# Patient Record
Sex: Male | Born: 1935 | Race: White | Hispanic: No | Smoking: Never smoker
Health system: Southern US, Community
[De-identification: ages and names within clinical notes are randomized; demographics above are authoritative.]

## PROBLEM LIST (undated history)

## (undated) DIAGNOSIS — K8020 Calculus of gallbladder without cholecystitis without obstruction: Secondary | ICD-10-CM

## (undated) DIAGNOSIS — D122 Benign neoplasm of ascending colon: Secondary | ICD-10-CM

## (undated) DIAGNOSIS — M51369 Other intervertebral disc degeneration, lumbar region without mention of lumbar back pain or lower extremity pain: Secondary | ICD-10-CM

## (undated) DIAGNOSIS — K429 Umbilical hernia without obstruction or gangrene: Secondary | ICD-10-CM

## (undated) DIAGNOSIS — M5136 Other intervertebral disc degeneration, lumbar region: Secondary | ICD-10-CM

## (undated) DIAGNOSIS — M1990 Unspecified osteoarthritis, unspecified site: Secondary | ICD-10-CM

## (undated) DIAGNOSIS — N529 Male erectile dysfunction, unspecified: Secondary | ICD-10-CM

## (undated) DIAGNOSIS — I10 Essential (primary) hypertension: Secondary | ICD-10-CM

## (undated) DIAGNOSIS — H8309 Labyrinthitis, unspecified ear: Secondary | ICD-10-CM

## (undated) DIAGNOSIS — Z974 Presence of external hearing-aid: Secondary | ICD-10-CM

## (undated) DIAGNOSIS — E785 Hyperlipidemia, unspecified: Secondary | ICD-10-CM

## (undated) DIAGNOSIS — K635 Polyp of colon: Secondary | ICD-10-CM

## (undated) DIAGNOSIS — R634 Abnormal weight loss: Secondary | ICD-10-CM

## (undated) DIAGNOSIS — D649 Anemia, unspecified: Secondary | ICD-10-CM

## (undated) DIAGNOSIS — L603 Nail dystrophy: Secondary | ICD-10-CM

## (undated) DIAGNOSIS — K219 Gastro-esophageal reflux disease without esophagitis: Secondary | ICD-10-CM

## (undated) DIAGNOSIS — H902 Conductive hearing loss, unspecified: Secondary | ICD-10-CM

## (undated) DIAGNOSIS — L309 Dermatitis, unspecified: Secondary | ICD-10-CM

## (undated) DIAGNOSIS — K921 Melena: Secondary | ICD-10-CM

## (undated) DIAGNOSIS — T7840XA Allergy, unspecified, initial encounter: Secondary | ICD-10-CM

## (undated) DIAGNOSIS — L280 Lichen simplex chronicus: Secondary | ICD-10-CM

## (undated) HISTORY — DX: Allergy, unspecified, initial encounter: T78.40XA

## (undated) HISTORY — PX: FRACTURE SURGERY: SHX138

## (undated) HISTORY — DX: Essential (primary) hypertension: I10

## (undated) HISTORY — DX: Polyp of colon: K63.5

## (undated) HISTORY — DX: Gastro-esophageal reflux disease without esophagitis: K21.9

## (undated) HISTORY — PX: OTHER SURGICAL HISTORY: SHX169

## (undated) HISTORY — PX: HERNIA REPAIR: SHX51

## (undated) HISTORY — PX: TONSILLECTOMY: SUR1361

## (undated) HISTORY — DX: Anemia, unspecified: D64.9

## (undated) HISTORY — DX: Lichen simplex chronicus: L28.0

---

## 1998-02-19 ENCOUNTER — Emergency Department (HOSPITAL_COMMUNITY): Admission: EM | Admit: 1998-02-19 | Discharge: 1998-02-19 | Payer: Self-pay | Admitting: Internal Medicine

## 1998-02-19 ENCOUNTER — Ambulatory Visit (HOSPITAL_COMMUNITY): Admission: RE | Admit: 1998-02-19 | Discharge: 1998-02-20 | Payer: Self-pay | Admitting: *Deleted

## 1998-07-29 ENCOUNTER — Ambulatory Visit (HOSPITAL_BASED_OUTPATIENT_CLINIC_OR_DEPARTMENT_OTHER): Admission: RE | Admit: 1998-07-29 | Discharge: 1998-07-29 | Payer: Self-pay | Admitting: Orthopedic Surgery

## 2001-02-01 ENCOUNTER — Encounter: Payer: Self-pay | Admitting: Emergency Medicine

## 2001-02-01 ENCOUNTER — Emergency Department (HOSPITAL_COMMUNITY): Admission: EM | Admit: 2001-02-01 | Discharge: 2001-02-01 | Payer: Self-pay | Admitting: Emergency Medicine

## 2004-04-02 ENCOUNTER — Emergency Department (HOSPITAL_COMMUNITY): Admission: EM | Admit: 2004-04-02 | Discharge: 2004-04-02 | Payer: Self-pay | Admitting: Emergency Medicine

## 2004-04-03 ENCOUNTER — Emergency Department (HOSPITAL_COMMUNITY): Admission: EM | Admit: 2004-04-03 | Discharge: 2004-04-03 | Payer: Self-pay | Admitting: Emergency Medicine

## 2005-07-15 ENCOUNTER — Emergency Department (HOSPITAL_COMMUNITY): Admission: EM | Admit: 2005-07-15 | Discharge: 2005-07-15 | Payer: Self-pay | Admitting: Emergency Medicine

## 2006-05-30 ENCOUNTER — Ambulatory Visit: Payer: Self-pay | Admitting: Family Medicine

## 2007-01-30 ENCOUNTER — Ambulatory Visit: Payer: Self-pay | Admitting: Family Medicine

## 2007-06-13 ENCOUNTER — Ambulatory Visit: Payer: Self-pay | Admitting: Family Medicine

## 2007-09-24 ENCOUNTER — Ambulatory Visit: Payer: Self-pay | Admitting: Family Medicine

## 2007-09-24 DIAGNOSIS — M129 Arthropathy, unspecified: Secondary | ICD-10-CM | POA: Insufficient documentation

## 2007-09-24 DIAGNOSIS — T50995A Adverse effect of other drugs, medicaments and biological substances, initial encounter: Secondary | ICD-10-CM | POA: Insufficient documentation

## 2007-09-24 DIAGNOSIS — E785 Hyperlipidemia, unspecified: Secondary | ICD-10-CM | POA: Insufficient documentation

## 2007-09-24 DIAGNOSIS — E039 Hypothyroidism, unspecified: Secondary | ICD-10-CM | POA: Insufficient documentation

## 2007-09-24 DIAGNOSIS — D649 Anemia, unspecified: Secondary | ICD-10-CM | POA: Insufficient documentation

## 2007-09-24 DIAGNOSIS — B354 Tinea corporis: Secondary | ICD-10-CM | POA: Insufficient documentation

## 2007-09-24 DIAGNOSIS — H902 Conductive hearing loss, unspecified: Secondary | ICD-10-CM | POA: Insufficient documentation

## 2007-10-09 ENCOUNTER — Telehealth: Payer: Self-pay | Admitting: Family Medicine

## 2007-10-10 ENCOUNTER — Encounter: Admission: RE | Admit: 2007-10-10 | Discharge: 2007-10-10 | Payer: Self-pay | Admitting: Family Medicine

## 2007-10-13 ENCOUNTER — Encounter: Admission: RE | Admit: 2007-10-13 | Discharge: 2007-10-13 | Payer: Self-pay | Admitting: Family Medicine

## 2007-11-05 ENCOUNTER — Ambulatory Visit: Payer: Self-pay | Admitting: Family Medicine

## 2007-11-05 DIAGNOSIS — H8309 Labyrinthitis, unspecified ear: Secondary | ICD-10-CM | POA: Insufficient documentation

## 2008-02-18 ENCOUNTER — Ambulatory Visit: Payer: Self-pay | Admitting: Family Medicine

## 2008-07-28 ENCOUNTER — Ambulatory Visit: Payer: Self-pay | Admitting: Family Medicine

## 2008-07-28 DIAGNOSIS — L259 Unspecified contact dermatitis, unspecified cause: Secondary | ICD-10-CM | POA: Insufficient documentation

## 2008-07-28 DIAGNOSIS — F528 Other sexual dysfunction not due to a substance or known physiological condition: Secondary | ICD-10-CM | POA: Insufficient documentation

## 2008-08-03 ENCOUNTER — Telehealth (INDEPENDENT_AMBULATORY_CARE_PROVIDER_SITE_OTHER): Payer: Self-pay | Admitting: *Deleted

## 2008-08-19 ENCOUNTER — Telehealth: Payer: Self-pay | Admitting: Family Medicine

## 2008-08-25 ENCOUNTER — Encounter: Payer: Self-pay | Admitting: Family Medicine

## 2008-08-26 ENCOUNTER — Encounter: Payer: Self-pay | Admitting: Family Medicine

## 2008-11-03 ENCOUNTER — Ambulatory Visit: Payer: Self-pay | Admitting: Internal Medicine

## 2008-11-24 ENCOUNTER — Ambulatory Visit: Payer: Self-pay | Admitting: Internal Medicine

## 2008-11-24 LAB — HM COLONOSCOPY

## 2008-12-18 ENCOUNTER — Ambulatory Visit (HOSPITAL_COMMUNITY): Admission: RE | Admit: 2008-12-18 | Discharge: 2008-12-18 | Payer: Self-pay | Admitting: Orthopedic Surgery

## 2009-01-25 ENCOUNTER — Encounter: Payer: Self-pay | Admitting: Family Medicine

## 2009-02-15 ENCOUNTER — Encounter: Payer: Self-pay | Admitting: Family Medicine

## 2009-03-22 ENCOUNTER — Encounter: Payer: Self-pay | Admitting: Family Medicine

## 2009-05-09 HISTORY — PX: OTHER SURGICAL HISTORY: SHX169

## 2009-07-08 ENCOUNTER — Ambulatory Visit: Payer: Self-pay | Admitting: Family Medicine

## 2009-11-04 ENCOUNTER — Ambulatory Visit: Payer: Self-pay | Admitting: Family Medicine

## 2009-11-04 DIAGNOSIS — K219 Gastro-esophageal reflux disease without esophagitis: Secondary | ICD-10-CM | POA: Insufficient documentation

## 2009-11-30 ENCOUNTER — Ambulatory Visit: Payer: Self-pay | Admitting: Family Medicine

## 2010-06-21 ENCOUNTER — Telehealth: Payer: Self-pay | Admitting: Family Medicine

## 2010-08-23 ENCOUNTER — Encounter: Payer: Self-pay | Admitting: Family Medicine

## 2010-08-24 ENCOUNTER — Telehealth: Payer: Self-pay | Admitting: Family Medicine

## 2010-09-04 LAB — CONVERTED CEMR LAB
ALT: 29 units/L (ref 0–53)
AST: 25 units/L (ref 0–37)
Alkaline Phosphatase: 74 units/L (ref 39–117)
Basophils Relative: 0.3 % (ref 0.0–1.0)
Bilirubin, Direct: 0.1 mg/dL (ref 0.0–0.3)
CO2: 31 meq/L (ref 19–32)
Calcium: 9.6 mg/dL (ref 8.4–10.5)
Chloride: 108 meq/L (ref 96–112)
Creatinine, Ser: 1 mg/dL (ref 0.4–1.5)
Eosinophils Relative: 5.3 % — ABNORMAL HIGH (ref 0.0–5.0)
Glucose, Bld: 110 mg/dL — ABNORMAL HIGH (ref 70–99)
HDL: 37.3 mg/dL — ABNORMAL LOW (ref >39.0)
Ketones, urine, test strip: NEGATIVE
Lymphocytes Relative: 23.5 % (ref 12.0–46.0)
Nitrite: NEGATIVE
Platelets: 193 10*3/uL (ref 150–400)
RBC: 4.97 M/uL (ref 4.22–5.81)
RDW: 12.9 % (ref 11.5–14.6)
Total CHOL/HDL Ratio: 5.4
Total Protein: 6.5 g/dL (ref 6.0–8.3)
Triglycerides: 130 mg/dL (ref 0–149)
Urobilinogen, UA: 0.2
VLDL: 26 mg/dL (ref 0–40)
WBC: 4.3 10*3/uL — ABNORMAL LOW (ref 4.5–10.5)
pH: 6.5

## 2010-09-06 NOTE — Progress Notes (Signed)
Summary: Pt is req a written script for Omeprazole 20mg   Phone Note Call from Patient   Caller: Patient Summary of Call: Pt is req to get a written script for Omeprazole 20mg  1qd. Pt says that he has found an online pharmacy where he can order meds cheaper. Pls call pt when written script is available.  Initial call taken by: Lucy Antigua,  June 21, 2010 3:07 PM  Follow-up for Phone Call        OK he can have a written rx for Omeprazole 20mg  tab 1 tab by mouth once daily, # 30, no refills. Follow-up by: Danise Edge MD,  June 21, 2010 7:40 PM  Additional Follow-up for Phone Call Additional follow up Details #1::        Put RX in black file to put up front. Pt informed. Additional Follow-up by: Josph Macho RMA,  June 22, 2010 8:23 AM    New/Updated Medications: OMEPRAZOLE 20 MG CPDR (OMEPRAZOLE) 1 tab by mouth daily Prescriptions: OMEPRAZOLE 20 MG CPDR (OMEPRAZOLE) 1 tab by mouth daily  #30 x 0   Entered by:   Josph Macho RMA   Authorized by:   Danise Edge MD   Signed by:   Josph Macho RMA on 06/22/2010   Method used:   Print then Give to Patient   RxID:   [id number] OMEPRAZOLE 20 MG CPDR (OMEPRAZOLE) OTC  #30 x 0   Entered by:   Josph Macho RMA   Authorized by:   Danise Edge MD   Signed by:   Josph Macho RMA on 06/22/2010   Method used:   Print then Give to Patient   RxID:   [id number]  Second one shredded was for OTC/ CF

## 2010-09-06 NOTE — Assessment & Plan Note (Signed)
Summary: MEDICATION CONCERNS // RS   Vital Signs:  Patient profile:   77 year old male Weight:      173 pounds O2 Sat:      96 % Temp:     98.1 degrees F Pulse rate:   71 / minute BP sitting:   130 / 80  (left arm)  Vitals Entered By: Pura Spice, RN (November 30, 2009 4:01 PM) CC: nexium caused diarrhea taking OTC omperazole  and doing ok with it.    History of Present Illness: Cyst in the 31-year-old white male was in recently with severe GERD and was treated with Nexium and after approximately 2 weeks he began having rather severe diarrhea after taking Nexium. He stopped it but then had reoccurrence of his esophageal reflux and then started taking omeprazole 20 mg OTC and doing fine, no diarrhea and relief of her He continues to see the dermatologist regarding his thumb clots or trauma of his lower extremity which is improving only slightly him to return for followup treatment No other complaints  Allergies (verified): No Known Drug Allergies  Past History:  Past Medical History: Last updated: 09/24/2007 Unremarkable  Past Surgical History: Last updated: 07/08/2009 Hernioraphy, rt , Oct [address]  Review of Systems      See HPI  The patient denies anorexia, fever, weight loss, weight gain, vision loss, decreased hearing, hoarseness, chest pain, syncope, dyspnea on exertion, peripheral edema, prolonged cough, headaches, hemoptysis, abdominal pain, melena, hematochezia, severe indigestion/heartburn, hematuria, incontinence, genital sores, muscle weakness, suspicious skin lesions, transient blindness, difficulty walking, depression, unusual weight change, abnormal bleeding, enlarged lymph nodes, angioedema, breast masses, and testicular masses.    Physical Exam  General:  Well-developed,well-nourished,in no acute distress; alert,appropriate and cooperative throughout examination Lungs:  Normal respiratory effort, chest expands symmetrically. Lungs are clear to auscultation, no  crackles or wheezes. Heart:  Normal rate and regular rhythm. S1 and S2 normal without gallop, murmur, click, rub or other extra sounds. Abdomen:  Bowel sounds positive,abdomen soft and non-tender without masses, organomegaly or hernias noted. Skin:  erythematous rash over left lower extremity   Impression & Recommendations:  Problem # 1:  GERD (ICD-530.81) Assessment Improved  The following medications were removed from the medication list:    Nexium 40 Mg Cpdr (Esomeprazole magnesium) .Marland Kitchen... 1 each day His updated medication list for this problem includes:    Omeprazole 20 Mg Cpdr (Omeprazole) ..... Otc  Problem # 2:  ERECTILE DYSFUNCTION (ICD-302.72) Assessment: Improved continues to use Viagra which he obtains from [place]  Problem # 3:  DERMATITIS (ICD-692.9) Assessment: Unchanged  Complete Medication List: 1)  Aspirin 81 Mg Tbec (Aspirin) .... One by mouth every day 2)  Omeprazole 20 Mg Cpdr (Omeprazole) .... Otc  Patient Instructions: 1)  for your GERD continue to get omeprazole 20 mg OTC and take one per day 2)  Return to see your dermatologist

## 2010-09-06 NOTE — Assessment & Plan Note (Signed)
Summary: NODULE IN GROIN AREA / HIATAL HERNIA CONCERNS // RS   Vital Signs:  Patient profile:   77 year old male Weight:      173 pounds O2 Sat:      97 % Temp:     98 degrees F Pulse rate:   86 / minute BP sitting:   140 / 80  (left arm)  Vitals Entered By: Pura Spice, RN (November 04, 2009 1:12 PM) CC: wants med for hiatal heria and ck left groin ? hernia    History of Present Illness: This 77 year old white male is in today complaining of symptoms compatible with GERD, occurring some Onalee Hua remarkably so at night. He does have definite symptoms of gastroesophageal reflux. Has had a herniorrhaphy left inguinal region and is concerned possible to read her and desires to be checked. In addition to this he has had a fungal rash over the perineum and thigh she has been treated with Lotrimin and is improving Her pelvis function is being controlled with Cialis which he buys in [place]  Allergies (verified): No Known Drug Allergies  Past History:  Past Medical History: Last updated: 09/24/2007 Unremarkable  Past Surgical History: Last updated: 07/08/2009 Hernioraphy, rt , Oct [address]  Review of Systems  The patient denies anorexia, fever, weight loss, weight gain, vision loss, decreased hearing, hoarseness, chest pain, syncope, dyspnea on exertion, peripheral edema, prolonged cough, headaches, hemoptysis, abdominal pain, melena, hematochezia, severe indigestion/heartburn, hematuria, incontinence, genital sores, muscle weakness, suspicious skin lesions, transient blindness, difficulty walking, depression, unusual weight change, abnormal bleeding, enlarged lymph nodes, angioedema, breast masses, and testicular masses.    Physical Exam  General:  Well-developed,well-nourished,in no acute distress; alert,appropriate and cooperative throughout examination Lungs:  Normal respiratory effort, chest expands symmetrically. Lungs are clear to auscultation, no crackles or wheezes. Heart:  Normal  rate and regular rhythm. S1 and S2 normal without gallop, murmur, click, rub or other extra sounds. Abdomen:  Bowel sounds positive,abdomen soft and non-tender without masses, organomegaly or hernias noted.no abdominal hernia and no inguinal hernia.   Genitalia:  past rash around her room and heard him compatible with fungal infection   Impression & Recommendations:  Problem # 1:  GERD (ICD-530.81) Assessment New  His updated medication list for this problem includes:    Nexium 40 Mg Cpdr (Esomeprazole magnesium) .Marland Kitchen... 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 One by mouth every day 2)  Nexium 40 Mg Cpdr (Esomeprazole magnesium) .Marland Kitchen.. 1 each day  Patient Instructions: 1)  For fungalinfection continue clotrimazole twice daily for another two weeks, if not better get ketoconazole and use it 2)  I  think you have hiatal hernia and esophageal reflux causing esophagitis with some esophageal stricture 3)  Take 1 Nexium 40 mg each day until finished, if symptoms rewturn then see me or call, may need to have endoscopy

## 2010-09-08 NOTE — Progress Notes (Signed)
Summary: refill  Phone Note Refill Request Call back at Home Phone [phone] Message from:  Patient------walk in  Refills Requested: Medication #1:  OMEPRAZOLE 20 MG CPDR 1 tab by mouth daily. need for mail order. please call when ready for pick up.  Initial call taken by: Warnell Forester,  August 24, 2010 2:49 PM  Follow-up for Phone Call        rx up front ready for p/u Follow-up by: Alfred Levins, CMA,  August 24, 2010 4:00 PM    Prescriptions: OMEPRAZOLE 20 MG CPDR (OMEPRAZOLE) 1 tab by mouth daily  #90 x 3   Entered by:   Alfred Levins, CMA   Authorized by:   Judithann Sheen MD   Signed by:   Alfred Levins, CMA on 08/24/2010   Method used:   Print then Give to Patient   RxID:   [id number]

## 2010-09-14 NOTE — Medication Information (Signed)
Summary: Order for Reuel Boom with wheels/[facility]  Order for Folding Walker with wheels/[facility]   Imported By: Maryln Gottron 09/06/2010 12:10:15  _____________________________________________________________________  External Attachment:    Type:   Image     Comment:   External Document

## 2010-11-13 ENCOUNTER — Ambulatory Visit: Payer: Self-pay | Admitting: Family Medicine

## 2011-01-05 ENCOUNTER — Other Ambulatory Visit: Payer: Self-pay | Admitting: Family Medicine

## 2011-01-05 ENCOUNTER — Telehealth: Payer: Self-pay | Admitting: Family Medicine

## 2011-01-05 NOTE — Telephone Encounter (Signed)
Wants to know if its time for labs to check his liver. Please advise pt.

## 2011-01-10 ENCOUNTER — Other Ambulatory Visit (INDEPENDENT_AMBULATORY_CARE_PROVIDER_SITE_OTHER): Payer: Medicare Other

## 2011-01-10 DIAGNOSIS — D649 Anemia, unspecified: Secondary | ICD-10-CM

## 2011-01-10 DIAGNOSIS — E559 Vitamin D deficiency, unspecified: Secondary | ICD-10-CM

## 2011-01-10 DIAGNOSIS — Z125 Encounter for screening for malignant neoplasm of prostate: Secondary | ICD-10-CM

## 2011-01-10 DIAGNOSIS — J309 Allergic rhinitis, unspecified: Secondary | ICD-10-CM

## 2011-01-10 DIAGNOSIS — Z79899 Other long term (current) drug therapy: Secondary | ICD-10-CM

## 2011-01-10 DIAGNOSIS — Z0000 Encounter for general adult medical examination without abnormal findings: Secondary | ICD-10-CM

## 2011-01-10 DIAGNOSIS — R079 Chest pain, unspecified: Secondary | ICD-10-CM

## 2011-01-10 DIAGNOSIS — Z0389 Encounter for observation for other suspected diseases and conditions ruled out: Secondary | ICD-10-CM

## 2011-01-10 DIAGNOSIS — K219 Gastro-esophageal reflux disease without esophagitis: Secondary | ICD-10-CM

## 2011-01-10 LAB — CBC WITH DIFFERENTIAL/PLATELET
Basophils Relative: 0.4 % (ref 0.0–3.0)
Eosinophils Absolute: 0.3 10*3/uL (ref 0.0–0.7)
HCT: 36.1 % — ABNORMAL LOW (ref 39.0–52.0)
Hemoglobin: 11.9 g/dL — ABNORMAL LOW (ref 13.0–17.0)
Lymphocytes Relative: 26.8 % (ref 12.0–46.0)
Lymphs Abs: 1.2 10*3/uL (ref 0.7–4.0)
MCHC: 32.9 g/dL (ref 30.0–36.0)
Neutro Abs: 2.6 10*3/uL (ref 1.4–7.7)
RBC: 4.61 Mil/uL (ref 4.22–5.81)

## 2011-01-10 LAB — BASIC METABOLIC PANEL
CO2: 27 mEq/L (ref 19–32)
Calcium: 9.1 mg/dL (ref 8.4–10.5)
Chloride: 106 mEq/L (ref 96–112)
Glucose, Bld: 103 mg/dL — ABNORMAL HIGH (ref 70–99)
Potassium: 4.3 mEq/L (ref 3.5–5.1)
Sodium: 139 mEq/L (ref 135–145)

## 2011-01-10 LAB — HEPATIC FUNCTION PANEL
ALT: 20 U/L (ref 0–53)
AST: 20 U/L (ref 0–37)
Alkaline Phosphatase: 81 U/L (ref 39–117)
Bilirubin, Direct: 0.1 mg/dL (ref 0.0–0.3)
Total Bilirubin: 0.4 mg/dL (ref 0.3–1.2)
Total Protein: 6.9 g/dL (ref 6.0–8.3)

## 2011-01-10 LAB — URINALYSIS
Bilirubin Urine: NEGATIVE
Hgb urine dipstick: NEGATIVE
pH: 6.5 (ref 5.0–8.0)

## 2011-01-10 LAB — TSH: TSH: 2.62 u[IU]/mL (ref 0.35–5.50)

## 2011-01-10 LAB — LIPID PANEL
Total CHOL/HDL Ratio: 4
Triglycerides: 62 mg/dL (ref 0.0–149.0)

## 2011-01-10 NOTE — Telephone Encounter (Signed)
Dr. Scotty Court is aware and will call pt

## 2011-01-13 ENCOUNTER — Other Ambulatory Visit: Payer: Self-pay | Admitting: Family Medicine

## 2011-01-16 ENCOUNTER — Encounter: Payer: Self-pay | Admitting: Family Medicine

## 2011-01-20 NOTE — Progress Notes (Signed)
Quick Note:  Dr. Scotty Court will go over results with pt at visit. ______

## 2011-01-24 ENCOUNTER — Encounter: Payer: Self-pay | Admitting: Family Medicine

## 2011-01-24 ENCOUNTER — Ambulatory Visit (INDEPENDENT_AMBULATORY_CARE_PROVIDER_SITE_OTHER): Payer: Medicare Other | Admitting: Family Medicine

## 2011-01-24 DIAGNOSIS — J309 Allergic rhinitis, unspecified: Secondary | ICD-10-CM

## 2011-01-24 DIAGNOSIS — R079 Chest pain, unspecified: Secondary | ICD-10-CM

## 2011-01-24 DIAGNOSIS — Z79899 Other long term (current) drug therapy: Secondary | ICD-10-CM

## 2011-01-24 DIAGNOSIS — D649 Anemia, unspecified: Secondary | ICD-10-CM

## 2011-01-24 DIAGNOSIS — K219 Gastro-esophageal reflux disease without esophagitis: Secondary | ICD-10-CM

## 2011-01-24 DIAGNOSIS — M1990 Unspecified osteoarthritis, unspecified site: Secondary | ICD-10-CM

## 2011-01-24 LAB — FERRITIN: Ferritin: 9.7 ng/mL — ABNORMAL LOW (ref 22.0–322.0)

## 2011-01-24 MED ORDER — OMEPRAZOLE 20 MG PO CPDR: DELAYED_RELEASE_CAPSULE | ORAL | Status: DC

## 2011-01-30 ENCOUNTER — Other Ambulatory Visit: Payer: Self-pay | Admitting: Family Medicine

## 2011-01-30 MED ORDER — FERREX 150 FORTE PLUS 50-100 MG PO CAPS: 1.0000 | ORAL_CAPSULE | Freq: Two times a day (BID) | ORAL | Status: DC

## 2011-02-09 ENCOUNTER — Other Ambulatory Visit (INDEPENDENT_AMBULATORY_CARE_PROVIDER_SITE_OTHER): Payer: Medicare Other | Admitting: Family Medicine

## 2011-02-09 DIAGNOSIS — IMO0001 Reserved for inherently not codable concepts without codable children: Secondary | ICD-10-CM

## 2011-02-09 DIAGNOSIS — K921 Melena: Secondary | ICD-10-CM

## 2011-02-09 LAB — HEMOCCULT GUIAC POC 1CARD (OFFICE)
Card #2 Fecal Occult Blod, POC: NEGATIVE
Fecal Occult Blood, POC: NEGATIVE

## 2011-03-05 ENCOUNTER — Encounter: Payer: Self-pay | Admitting: Family Medicine

## 2011-03-05 NOTE — Patient Instructions (Signed)
In general failure [place] well we need to treat your iron deficiency anemia and continue your other medications for allergic rhinitis and GERD Impression is that your chest pain is related to the gastroesophageal reflux

## 2011-03-05 NOTE — Progress Notes (Signed)
  Subjective:    Patient ID: Marco Sims, male    DOB: 1934-06-24, 77 y.o.   MRN: [id number] This 77 year old white widower, then remarried a nd now divorced, but now has another friend is in today for evaluation of his medical problems and especially the problem was anemia. One of his other complaints is that he occasionally has precordial chest pain not nesters early on exertion or radiation to the neck shoulder nor diaphoresis electrocardiogram reveals bradycardia with no acute changes nor indication of ischemia Patient relates orient the right herniorrhaphy he has had no problems He needs immunizations of Zostavax and pneumococcal injection He is very active continues to work HPI    Review of Systems CHPI    Objective:   Physical Exam the patient is a well-built well-nourished white male who appears active in no distress and in fact appears younger and given age. HEENT examination the nose reveals pale nasal mucosa he has bilateral hearing loss with hearing aid Carotid pulses are good thyroid is nonpalpable Lungs clear to palpation percussion and auscultation no rales are heard Heart examination reveals a slow rate no murmurs regular rhythm no cardiomegaly Electrocardiogram reveals sinus bradycardia no evidence of ischemia or arrhythmia Abdomen liver spleen kidneys are nonpalpable no masses no tenderness bowel sounds normal appear scar right inguinal region from hernioraphy Genitalia normal Rectal examination reveals prostate to be x2 no nodules no tenderness firm Extremities negative except some evidence of varicosities Spine negative Skin no evidence dermatitist at this time Neurological exam negative          Assessment & Plan:  Anemia iron deficiency to obtain lab studies with total iron ferritin vitamin B12, also to do Hemoccult x3 To arrange for Zostavax and pneumococcal injection Chest pain related to GERD and increased omeprazole to one twice a day Allergic rhinitis  to continue loratadine 20 mg each day and gave samples of Nasonex nasal spray

## 2011-06-01 ENCOUNTER — Ambulatory Visit: Payer: Self-pay | Admitting: General Practice

## 2011-06-02 HISTORY — PX: HAND SURGERY: SHX662

## 2011-08-08 HISTORY — PX: COLONOSCOPY: SHX174

## 2011-09-15 ENCOUNTER — Ambulatory Visit: Payer: Self-pay | Admitting: Family Medicine

## 2012-04-26 ENCOUNTER — Ambulatory Visit (INDEPENDENT_AMBULATORY_CARE_PROVIDER_SITE_OTHER): Payer: Medicare Other | Admitting: Family Medicine

## 2012-04-26 ENCOUNTER — Encounter: Payer: Self-pay | Admitting: Family Medicine

## 2012-04-26 VITALS — BP 140/70 | Temp 97.9°F | Ht 68.75 in | Wt 178.0 lb

## 2012-04-26 DIAGNOSIS — K219 Gastro-esophageal reflux disease without esophagitis: Secondary | ICD-10-CM

## 2012-04-26 DIAGNOSIS — K5900 Constipation, unspecified: Secondary | ICD-10-CM

## 2012-04-26 DIAGNOSIS — Z23 Encounter for immunization: Secondary | ICD-10-CM

## 2012-04-26 MED ORDER — OMEPRAZOLE 20 MG PO CPDR: DELAYED_RELEASE_CAPSULE | ORAL | Status: DC

## 2012-04-26 NOTE — Patient Instructions (Addendum)
Constipation in Adults Constipation is having fewer than 2 bowel movements per week. Usually, the stools are hard. As we grow older, constipation is more common. If you try to fix constipation with laxatives, the problem may get worse. This is because laxatives taken over a long period of time make the colon muscles weaker. A low-fiber diet, not taking in enough fluids, and taking some medicines may make these problems worse  Fiber supplement daily (metameucil)  Follow up in 2-3 months  MEDICATIONS THAT MAY CAUSE CONSTIPATION  Water pills (diuretics).   Calcium channel blockers (used to control blood pressure and for the heart).   Certain pain medicines (narcotics).   Anticholinergics.   Anti-inflammatory agents.   Antacids that contain aluminum.    HOME CARE INSTRUCTIONS   Constipation is usually best cared for without medicines. Increasing dietary fiber and eating more fruits and vegetables is the best way to manage constipation.   Slowly increase fiber intake to 25 to 38 grams per day. Whole grains, fruits, vegetables, and legumes are good sources of fiber. A dietitian can further help you incorporate high-fiber foods into your diet.   Drink enough water and fluids to keep your urine clear or pale yellow.   A fiber supplement may be added to your diet if you cannot get enough fiber from foods.   Increasing your activities also helps improve regularity.   Suppositories, as suggested by your caregiver, will also help. If you are using antacids, such as aluminum or calcium containing products, it will be helpful to switch to products containing magnesium if your caregiver says it is okay.   If you have been given a liquid injection (enema) today, this is only a temporary measure. It should not be relied on for treatment of longstanding (chronic) constipation.   Stronger measures, such as magnesium sulfate, should be avoided if possible. This may cause uncontrollable diarrhea.  Using magnesium sulfate may not allow you time to make it to the bathroom.  SEEK IMMEDIATE MEDICAL CARE IF:   There is bright red blood in the stool.   The constipation stays for more than 4 days.   There is belly (abdominal) or rectal pain.   You do not seem to be getting better.   You have any questions or concerns.  MAKE SURE YOU:   Understand these instructions.   Will watch your condition.   Will get help right away if you are not doing well or get worse.  Document Released: 04/21/2004 Document Revised: 07/13/2011 Document Reviewed: 06/27/2011 [facility] Patient Information 2012 [place], [place].    Thank you for enrolling in MyChart. Please follow the instructions below to securely access your online medical record. MyChart allows you to send messages to your doctor, view your test results, renew your prescriptions, schedule appointments, and more.  How Do I Sign Up? 1. In your Internet browser, go to [email or web address] 2. Click on the New  User? link in the Sign In box.  3. Enter your MyChart Access Code exactly as it appears below. You will not need to use this code after you have completed the sign-up process. If you do not sign up before the expiration date, you must request a new code. MyChart Access Code: [email or web address] Expires: 05/26/2012 12:00 PM  4. Enter the last four digits of your Social Security Number (xxxx) and Date of Birth (mm/dd/yyyy) as indicated and click Next. You will be taken to the next sign-up page. 5. Create a MyChart ID. This  will be your MyChart login ID and cannot be changed, so think of one that is secure and easy to remember. 6. Create a MyChart password. You can change your password at any time. 7. Enter your Password Reset Question and Answer and click Next. This can be used at a later time if you forget your password.  8. Select your communication preference, and if applicable enter your e-mail address. You will receive  e-mail notification when new information is available in MyChart by choosing to receive e-mail notifications and filling in your e-mail. 9. Click Sign In. You can now view your medical record.   Additional Information If you have questions, you can email [email or web address] or call [phone] to talk to our MyChart staff. Remember, MyChart is NOT to be used for urgent needs. For medical emergencies, dial 911.

## 2012-04-26 NOTE — Progress Notes (Signed)
Chief Complaint  Patient presents with  . Establish Care    HPI:  Here to establish care and having some constipation.   Change in bowel habits: -started about 6 months ago -straining to have bowel movements, when he does go usually hard pellets sometimes small amount of stool -denies: decreased energy, fevers, chills, weight loss, blood in stools, abdominal pain, nausea or vomiting or dark stools (did have a little pain in lower R abd for a few days a few weeks ago - but this resolved) -does not get a lot fiber in diet or drink a lot of water -has hx of hemorrhoids -has had his regular colonoscopies - not due for next colonoscopy until 2015  Needs refill on omeprazole for GERD: -occasionally tries to taper and gets reflux when he does -is fine when on the medication -no dysphagia, abd pain, heartburn, cough, breathing problems, nausea, vomiting  ROS: See pertinent positives and negatives per HPI.  Past Medical History  Diagnosis Date  . Lichen simplex chronicus   . Allergy     seaasonal  . Anemia   . GERD (gastroesophageal reflux disease)     Family History  Problem Relation Age of Onset  . Mental illness Mother   . Cancer Brother     History   Social History  . Marital Status: Widowed    Spouse Name: N/A    Number of Children: N/A  . Years of Education: N/A   Social History Main Topics  . Smoking status: Never Smoker   . Smokeless tobacco: Never Used  . Alcohol Use: Yes     rare; 1 to 2 beers a month  . Drug Use: No  . Sexually Active: Yes   Other Topics Concern  . None   Social History Narrative  . None    Current outpatient prescriptions:aspirin 81 MG tablet, Take 81 mg by mouth daily.  , Disp: , Rfl: ;  Cholecalciferol (VITAMIN D) 1000 UNITS capsule, Take 1,000 Units by mouth daily.  , Disp: , Rfl: ;  Loratadine 10 MG CAPS, Take by mouth daily.  , Disp: , Rfl: ;  omeprazole (PRILOSEC) 20 MG capsule, 1 cap qd, Disp: 90 capsule, Rfl: 3;  DISCONTD:  omeprazole (PRILOSEC) 20 MG capsule, 1 cap qd , Disp: 90 capsule, Rfl: 3 Cyanocobalamin (VITAMIN B-12 PO), Take by mouth daily.  , Disp: , Rfl: ;  Fe-Succ Ac-C-Thre Ac-B12-FA (FERREX 150 FORTE PLUS) 50-100 MG CAPS, Take 1 capsule by mouth 2 (two) times daily., Disp: 60 each, Rfl: 3  EXAM:  Filed Vitals:   04/26/12 1115  BP: 140/70  Temp: 97.9 F (36.6 C)    Body mass index is 26.48 kg/(m^2).  GENERAL: vitals reviewed and listed below, alert, oriented, appears well hydrated and in no acute distress  HEENT: atraumatic, conjucntiva clear, no obvious abnormalities on inspection of external nose and ears  NECK: no masses on inspection  LUNGS: clear to auscultation bilaterally, no wheezes, rales or rhonchi, good air movement  CV: HRRR, no peripheral edema  ABD: BS+, soft, NTTP  MS: moves all extremities without noticeable abnormality  PSYCH: pleasant and cooperative, no obvious depression or anxiety  ASSESSMENT AND PLAN:  Discussed the following assessment and plan:  1. Constipation   2. GERD (gastroesophageal reflux disease)    Discussed constipation and possible etiologies at length.  No alarm symptoms to suggest serious etiology and will treat conservatively with close follow up. Discussed treatment options and risks/benefits. Offered stool softener/laxative but pt  prefers to try lifestyle changes and fiber supplement for now. On review of labs some anemia previously, but resolved on recheck and pt reports blood work for giving blood today with normal hemoglobin. Will check basic labs including CBC at next appointment. Refilled omeprazole and discussed lifestyle changes for GERD and risks/benefits of medication. History and medications reviewed. Return precautions discussed. Flu vaccine given today. Follow up in 2-3 months. No orders of the defined types were placed in this encounter.    Patient Instructions  Constipation in Adults Constipation is having fewer than 2  bowel movements per week. Usually, the stools are hard. As we grow older, constipation is more common. If you try to fix constipation with laxatives, the problem may get worse. This is because laxatives taken over a long period of time make the colon muscles weaker. A low-fiber diet, not taking in enough fluids, and taking some medicines may make these problems worse  Fiber supplement daily (metameucil)  Follow up in 2-3 months  MEDICATIONS THAT MAY CAUSE CONSTIPATION  Water pills (diuretics).   Calcium channel blockers (used to control blood pressure and for the heart).   Certain pain medicines (narcotics).   Anticholinergics.   Anti-inflammatory agents.   Antacids that contain aluminum.    HOME CARE INSTRUCTIONS   Constipation is usually best cared for without medicines. Increasing dietary fiber and eating more fruits and vegetables is the best way to manage constipation.   Slowly increase fiber intake to 25 to 38 grams per day. Whole grains, fruits, vegetables, and legumes are good sources of fiber. A dietitian can further help you incorporate high-fiber foods into your diet.   Drink enough water and fluids to keep your urine clear or pale yellow.   A fiber supplement may be added to your diet if you cannot get enough fiber from foods.   Increasing your activities also helps improve regularity.   Suppositories, as suggested by your caregiver, will also help. If you are using antacids, such as aluminum or calcium containing products, it will be helpful to switch to products containing magnesium if your caregiver says it is okay.   If you have been given a liquid injection (enema) today, this is only a temporary measure. It should not be relied on for treatment of longstanding (chronic) constipation.   Stronger measures, such as magnesium sulfate, should be avoided if possible. This may cause uncontrollable diarrhea. Using magnesium sulfate may not allow you time to make it to  the bathroom.  SEEK IMMEDIATE MEDICAL CARE IF:   There is bright red blood in the stool.   The constipation stays for more than 4 days.   There is belly (abdominal) or rectal pain.   You do not seem to be getting better.   You have any questions or concerns.  MAKE SURE YOU:   Understand these instructions.   Will watch your condition.   Will get help right away if you are not doing well or get worse.  Document Released: 04/21/2004 Document Revised: 07/13/2011 Document Reviewed: 06/27/2011 [facility] Patient Information 2012 [place], [place].    Thank you for enrolling in MyChart. Please follow the instructions below to securely access your online medical record. MyChart allows you to send messages to your doctor, view your test results, renew your prescriptions, schedule appointments, and more.  How Do I Sign Up? 1. In your Internet browser, go to [email or web address] 2. Click on the New  User? link in the Sign In box.  3. Enter your MyChart Access Code exactly as it appears below. You will not need to use this code after you have completed the sign-up process. If you do not sign up before the expiration date, you must request a new code. MyChart Access Code: [email or web address] Expires: 05/26/2012 12:00 PM  4. Enter the last four digits of your Social Security Number (xxxx) and Date of Birth (mm/dd/yyyy) as indicated and click Next. You will be taken to the next sign-up page. 5. Create a MyChart ID. This will be your MyChart login ID and cannot be changed, so think of one that is secure and easy to remember. 6. Create a MyChart password. You can change your password at any time. 7. Enter your Password Reset Question and Answer and click Next. This can be used at a later time if you forget your password.  8. Select your communication preference, and if applicable enter your e-mail address. You will receive e-mail notification when new information is available in  MyChart by choosing to receive e-mail notifications and filling in your e-mail. 9. Click Sign In. You can now view your medical record.   Additional Information If you have questions, you can email [email or web address] or call [phone] to talk to our MyChart staff. Remember, MyChart is NOT to be used for urgent needs. For medical emergencies, dial 911.      Return to clinic immediately if symptoms worsen or persist or new concerns.  No Follow-up on file.  Kriste Basque R.

## 2012-06-07 ENCOUNTER — Ambulatory Visit: Payer: Self-pay | Admitting: Family Medicine

## 2012-06-07 LAB — CREATININE, SERUM
EGFR (African American): >60
EGFR (Non-African Amer.): 60 — ABNORMAL LOW

## 2012-07-02 ENCOUNTER — Ambulatory Visit: Payer: Self-pay | Admitting: Surgery

## 2012-07-02 LAB — BASIC METABOLIC PANEL
Calcium, Total: 9 mg/dL (ref 8.5–10.1)
Co2: 28 mmol/L (ref 21–32)
Creatinine: 0.79 mg/dL (ref 0.60–1.30)
EGFR (African American): >60
EGFR (Non-African Amer.): >60
Glucose: 89 mg/dL (ref 65–99)
Potassium: 4.4 mmol/L (ref 3.5–5.1)
Sodium: 143 mmol/L (ref 136–145)

## 2012-07-02 LAB — CBC WITH DIFFERENTIAL/PLATELET
Basophil %: 0.8 %
Eosinophil %: 6.3 %
HCT: 36.6 % — ABNORMAL LOW (ref 40.0–52.0)
HGB: 12 g/dL — ABNORMAL LOW (ref 13.0–18.0)
Lymphocyte %: 21.3 %
MCH: 25.6 pg — ABNORMAL LOW (ref 26.0–34.0)
Monocyte #: 0.4 x10 3/mm (ref 0.2–1.0)
Neutrophil #: 3.1 10*3/uL (ref 1.4–6.5)
Neutrophil %: 63.6 %
RBC: 4.68 10*6/uL (ref 4.40–5.90)

## 2012-07-11 ENCOUNTER — Ambulatory Visit: Payer: Self-pay | Admitting: Surgery

## 2012-09-25 ENCOUNTER — Ambulatory Visit: Payer: Self-pay | Admitting: Surgery

## 2012-09-30 ENCOUNTER — Ambulatory Visit: Payer: Self-pay | Admitting: Surgery

## 2013-08-07 HISTORY — PX: HERNIA REPAIR: SHX51

## 2013-11-13 ENCOUNTER — Encounter: Payer: Self-pay | Admitting: Internal Medicine

## 2013-11-18 ENCOUNTER — Ambulatory Visit: Payer: Self-pay | Admitting: Family Medicine

## 2013-11-18 ENCOUNTER — Inpatient Hospital Stay: Payer: Self-pay | Admitting: Internal Medicine

## 2013-11-18 LAB — URINALYSIS, COMPLETE
BILIRUBIN, UR: NEGATIVE
Glucose,UR: NEGATIVE mg/dL (ref 0–75)
Hyaline Cast: 11
Ketone: NEGATIVE
LEUKOCYTE ESTERASE: NEGATIVE
Nitrite: NEGATIVE
Ph: 5 (ref 4.5–8.0)
Specific Gravity: 1.021 (ref 1.003–1.030)
Squamous Epithelial: NONE SEEN
WBC UR: 5 /HPF (ref 0–5)

## 2013-11-18 LAB — CBC WITH DIFFERENTIAL/PLATELET
BASOS ABS: 0 10*3/uL (ref 0.0–0.1)
BASOS PCT: 0.1 %
Basophil #: 0 10*3/uL (ref 0.0–0.1)
Basophil %: 0.1 %
EOS PCT: 0 %
Eosinophil #: 0 10*3/uL (ref 0.0–0.7)
Eosinophil #: 0 10*3/uL (ref 0.0–0.7)
Eosinophil %: 0 %
HCT: 38.2 % — AB (ref 40.0–52.0)
HCT: 38.2 % — AB (ref 40.0–52.0)
HGB: 12.4 g/dL — AB (ref 13.0–18.0)
HGB: 12.2 g/dL — ABNORMAL LOW (ref 13.0–18.0)
LYMPHS ABS: 0.3 10*3/uL — AB (ref 1.0–3.6)
LYMPHS PCT: 2.1 %
LYMPHS PCT: 2.5 %
Lymphocyte #: 0.3 10*3/uL — ABNORMAL LOW (ref 1.0–3.6)
MCH: 26.2 pg (ref 26.0–34.0)
MCH: 26.2 pg (ref 26.0–34.0)
MCHC: 32.3 g/dL (ref 32.0–36.0)
MCHC: 31.9 g/dL — AB (ref 32.0–36.0)
MCV: 81 fL (ref 80–100)
MCV: 82 fL (ref 80–100)
MONO ABS: 0.6 x10 3/mm (ref 0.2–1.0)
MONOS PCT: 5.6 %
MONOS PCT: 4.4 %
Monocyte #: 0.8 x10 3/mm (ref 0.2–1.0)
NEUTROS ABS: 12.6 10*3/uL — AB (ref 1.4–6.5)
NEUTROS PCT: 92.2 %
Neutrophil #: 13.1 10*3/uL — ABNORMAL HIGH (ref 1.4–6.5)
Neutrophil %: 93 %
PLATELETS: 181 10*3/uL (ref 150–440)
Platelet: 169 10*3/uL (ref 150–440)
RBC: 4.72 10*6/uL (ref 4.40–5.90)
RBC: 4.65 10*6/uL (ref 4.40–5.90)
RDW: 15 % — ABNORMAL HIGH (ref 11.5–14.5)
RDW: 15 % — ABNORMAL HIGH (ref 11.5–14.5)
WBC: 14.3 10*3/uL — ABNORMAL HIGH (ref 3.8–10.6)
WBC: 13.6 10*3/uL — AB (ref 3.8–10.6)

## 2013-11-18 LAB — PROTIME-INR
INR: 1.3
PROTHROMBIN TIME: 15.5 s — AB (ref 11.5–14.7)

## 2013-11-18 LAB — BASIC METABOLIC PANEL
ANION GAP: 8 (ref 7–16)
BUN: 23 mg/dL — ABNORMAL HIGH (ref 7–18)
CALCIUM: 8.8 mg/dL (ref 8.5–10.1)
CHLORIDE: 102 mmol/L (ref 98–107)
CO2: 26 mmol/L (ref 21–32)
Creatinine: 1.88 mg/dL — ABNORMAL HIGH (ref 0.60–1.30)
EGFR (Non-African Amer.): 34 — ABNORMAL LOW
GFR CALC AF AMER: 39 — AB
Glucose: 183 mg/dL — ABNORMAL HIGH (ref 65–99)
OSMOLALITY: 280 (ref 275–301)
Potassium: 3.9 mmol/L (ref 3.5–5.1)
SODIUM: 136 mmol/L (ref 136–145)

## 2013-11-18 LAB — TROPONIN I
Troponin-I: <0.02 ng/mL
Troponin-I: <0.02 ng/mL

## 2013-11-19 LAB — CREATININE, SERUM
Creatinine: 1.31 mg/dL — ABNORMAL HIGH (ref 0.60–1.30)
EGFR (African American): >60
EGFR (Non-African Amer.): 52 — ABNORMAL LOW

## 2013-11-19 LAB — CBC WITH DIFFERENTIAL/PLATELET
BASOS PCT: 0.1 %
Basophil #: 0 10*3/uL (ref 0.0–0.1)
Eosinophil #: 0 10*3/uL (ref 0.0–0.7)
Eosinophil %: 0 %
HCT: 33.5 % — ABNORMAL LOW (ref 40.0–52.0)
HGB: 11.3 g/dL — ABNORMAL LOW (ref 13.0–18.0)
LYMPHS PCT: 6.5 %
Lymphocyte #: 0.6 10*3/uL — ABNORMAL LOW (ref 1.0–3.6)
MCH: 27.3 pg (ref 26.0–34.0)
MCHC: 33.8 g/dL (ref 32.0–36.0)
MCV: 81 fL (ref 80–100)
MONOS PCT: 5.4 %
Monocyte #: 0.5 x10 3/mm (ref 0.2–1.0)
Neutrophil #: 8.3 10*3/uL — ABNORMAL HIGH (ref 1.4–6.5)
Neutrophil %: 88 %
PLATELETS: 145 10*3/uL — AB (ref 150–440)
RBC: 4.14 10*6/uL — AB (ref 4.40–5.90)
RDW: 14.8 % — ABNORMAL HIGH (ref 11.5–14.5)
WBC: 9.5 10*3/uL (ref 3.8–10.6)

## 2013-11-20 LAB — BASIC METABOLIC PANEL
ANION GAP: 5 — AB (ref 7–16)
BUN: 20 mg/dL — AB (ref 7–18)
Calcium, Total: 8.3 mg/dL — ABNORMAL LOW (ref 8.5–10.1)
Chloride: 110 mmol/L — ABNORMAL HIGH (ref 98–107)
Co2: 24 mmol/L (ref 21–32)
Creatinine: 1.07 mg/dL (ref 0.60–1.30)
EGFR (African American): >60
Glucose: 127 mg/dL — ABNORMAL HIGH (ref 65–99)
Osmolality: 282 (ref 275–301)
Potassium: 3.5 mmol/L (ref 3.5–5.1)
Sodium: 139 mmol/L (ref 136–145)

## 2013-11-20 LAB — CBC WITH DIFFERENTIAL/PLATELET
Basophil #: 0 10*3/uL (ref 0.0–0.1)
Basophil %: 0.4 %
EOS ABS: 0.1 10*3/uL (ref 0.0–0.7)
Eosinophil %: 2 %
HCT: 33.3 % — ABNORMAL LOW (ref 40.0–52.0)
HGB: 11.1 g/dL — ABNORMAL LOW (ref 13.0–18.0)
LYMPHS ABS: 0.6 10*3/uL — AB (ref 1.0–3.6)
Lymphocyte %: 11.2 %
MCH: 26.7 pg (ref 26.0–34.0)
MCHC: 33.4 g/dL (ref 32.0–36.0)
MCV: 80 fL (ref 80–100)
Monocyte #: 0.5 x10 3/mm (ref 0.2–1.0)
Monocyte %: 8.9 %
NEUTROS PCT: 77.5 %
Neutrophil #: 4.4 10*3/uL (ref 1.4–6.5)
PLATELETS: 154 10*3/uL (ref 150–440)
RBC: 4.17 10*6/uL — ABNORMAL LOW (ref 4.40–5.90)
RDW: 15.1 % — ABNORMAL HIGH (ref 11.5–14.5)
WBC: 5.7 10*3/uL (ref 3.8–10.6)

## 2013-11-20 LAB — URINE CULTURE

## 2013-11-20 LAB — OCCULT BLOOD X 1 CARD TO LAB, STOOL: Occult Blood, Feces: NEGATIVE

## 2013-11-23 LAB — CULTURE, BLOOD (SINGLE)

## 2014-02-08 LAB — CULTURE, BLOOD (SINGLE)

## 2014-08-17 DIAGNOSIS — K219 Gastro-esophageal reflux disease without esophagitis: Secondary | ICD-10-CM | POA: Diagnosis not present

## 2014-09-01 DIAGNOSIS — H6123 Impacted cerumen, bilateral: Secondary | ICD-10-CM | POA: Diagnosis not present

## 2014-09-01 DIAGNOSIS — M940 Chondrocostal junction syndrome [Tietze]: Secondary | ICD-10-CM | POA: Diagnosis not present

## 2014-09-01 DIAGNOSIS — K1120 Sialoadenitis, unspecified: Secondary | ICD-10-CM | POA: Diagnosis not present

## 2014-09-10 DIAGNOSIS — H903 Sensorineural hearing loss, bilateral: Secondary | ICD-10-CM | POA: Diagnosis not present

## 2014-09-10 DIAGNOSIS — D489 Neoplasm of uncertain behavior, unspecified: Secondary | ICD-10-CM | POA: Diagnosis not present

## 2014-09-10 DIAGNOSIS — H6123 Impacted cerumen, bilateral: Secondary | ICD-10-CM | POA: Diagnosis not present

## 2014-09-17 DIAGNOSIS — R221 Localized swelling, mass and lump, neck: Secondary | ICD-10-CM | POA: Diagnosis not present

## 2014-11-17 DIAGNOSIS — Z0000 Encounter for general adult medical examination without abnormal findings: Secondary | ICD-10-CM | POA: Diagnosis not present

## 2014-11-17 DIAGNOSIS — Z1389 Encounter for screening for other disorder: Secondary | ICD-10-CM | POA: Diagnosis not present

## 2014-11-24 NOTE — Op Note (Signed)
PATIENT NAME:  Marco Sims, Marco Sims MR#:  [id number] DATE OF BIRTH:  02-19-1934  DATE OF PROCEDURE:  07/11/2012  PREOPERATIVE DIAGNOSIS: Left inguinal hernia.   POSTOPERATIVE DIAGNOSIS: Left direct inguinal hernia.   PROCEDURE PERFORMED: Left direct inguinal hernia repair with small oval Kugel patch preperitoneal.   SURGEON: Pritika Alvarez A. Marina Gravel MD   ASSISTANT: CST  ANESTHESIA: General with local.   FINDINGS: Large direct inguinal hernia.   DESCRIPTION OF PROCEDURE: With the patient in the supine position, general endotracheal anesthesia was induced. The left groin having been previously clipped of hair was prepped and draped utilizing ChloraPrep solution. The lower abdomen was also prepped and draped with Ioban draping.   Time-out was observed.   An ilioinguinal field block was created on the right side because of existing pain from previous hernia repair. Curvilinear incision was fashioned in the left groin one fingerbreadth above the inguinal ligament with scalpel measuring approximately 5 cm and carried down through Scarpa's fascia with electrocautery with bridging veins divided between clamps and tied with #2-0 Vicryl suture. The spermatic cord which was exposed by excising the external oblique laterally and carrying this dissection through the external ring. Vas and vessels were separated from a small prosthesis vaginalis which was doubly ligated with #2-0 silk suture at its base and then amputated. A large direct inguinal hernia was identified and dissected free from the undersurface of the spermatic cord. It was imbricated utilizing 2-0 silk suture in running locking fashion. Cooper's ligament was identified. A preperitoneal pocket was then fashioned on the undersurface of the transversalis fascia and along the undersurface of the conjoined tendon extending laterally covering the area of the spermatic cord inlet into the internal ring. A small oval Kugel patch was brought onto the field, soaked in GU  irrigant on the back table, and secured to Cooper's ligament with a #0 Vicryl suture on UR-6 application. The mesh was then unfurled to cover the entire hernia defect. It was attached laterally to the transversalis muscle at two points with #0 Vicryl suture. A relaxing incision was then fashioned medially allowing the conjoined tendon and transversalis muscle to cover the mesh by securing its edge to the shelving edge of the inguinal ligament. Floor was recreated.   With lap and needle count correct x2, the abdominal external oblique fascia was reapproximated laterally to medially with a running #0 Vicryl suture. A total of 10 mL of 0.25% plain Marcaine was infiltrated along all skin and fascial incisions prior to closure. Scarpa's fascia was reapproximated with 2-0 Vicryl. Interrupted deep dermal 3-0 Vicryls were placed followed by running 4-0 Vicryl subcuticular in the skin, benzoin, Steri-Strips, Telfa, and Tegaderm. The patient was then returned to the recovery room in stable and satisfactory condition by anesthesia services.   ____________________________ Jeannette How Marina Gravel, MD mab:drc D: 07/11/2012 16:19:35 ET T: 07/11/2012 17:52:13 ET JOB#: [id number]  cc: Elta Guadeloupe A. Marina Gravel, MD, <Dictator> Hortencia Conradi MD ELECTRONICALLY SIGNED 07/12/2012 17:44

## 2014-11-27 NOTE — Op Note (Signed)
PATIENT NAME:  Marco Sims, Marco Sims MR#:  [id number] DATE OF BIRTH:  August 23, 1933  DATE OF PROCEDURE:  09/30/2012  PREOPERATIVE DIAGNOSIS: Recurrent right inguinal hernia.   POSTOPERATIVE DIAGNOSIS: Recurrent right inguinal hernia.   PROCEDURE PERFORMED: Laparoscopic preperitoneal inguinal herniorrhaphy with mesh. (Recurrent hernia)  SURGEON: Hortencia Conradi, MD   Assistant Surgeon: Phoebe Perch, MD   TYPE OF ANESTHESIA: General endotracheal.   DESCRIPTION OF PROCEDURE: With the patient in the supine position, general endotracheal anesthesia was induced. A Foley catheter was placed under sterile technique. Timeout was observed after the abdomen was sterilely prepped and draped with ChloraPrep solution. An obliquely-oriented incision was fashioned to the right of the umbilicus, carried down with blunt technique through Scarpa's fascia to the anterior rectus sheath, which was incised in an oblique orientation. The preperitoneal space was entered. The [place] [employer] balloon with round balloon application was inserted. The preperitoneal space was dissected under direct visualization. The balloon was then removed and exchanged for the operating [place] [employer] trocar. The preperitoneal space was then insufflated with CO2. Two 5 mm trocars were placed in the midline. A small rent was created in the peritoneum with placement of the lowermost trocar. Cooper's ligament was identified. The space lateral to the spermatic cord was identified and dissected to the abdominal wall. Evidence of the previously placed surgical mesh plug was identified with adherent preperitoneal fat which was taken down with blunt technique. There was a large indirect inguinal hernia which was reduced, separated from the cord structures. A window was fashioned underneath the cord structures. A 4 inch x 6 inch Atrium ProLite mesh was scissored on the back table and then inserted into the preperitoneal space. It was then tacked along the locations of the  Cooper's ligament anterior abdominal wall with the two tails crisscrossed utilizing the ProTack device laterally and superiorly. A  tack was placed on either side of the epigastric vessels. The new internal ring was then obliterated utilizing the ProTack device with a tack placed between the anterior and posterior leaflets. The preperitoneal space was then desufflated. Ports were then removed. There was no evidence of bowel coming up and touching the mesh. The fascial defect was reapproximated with a figure-of-eight #0 Vicryl suture. A total of 30 mL of 0.25% plain Marcaine was infiltrated prior to skin prior to skin closure; 4-0 Vicryl subcuticular was applied to all skin edges followed by the application of benzoin, Steri-Strips, and occlusive sterile dressings. The Foley catheter was then removed, and the patient was then subsequently extubated and taken to the recovery room in stable and satisfactory condition by Anesthesia services.   ____________________________ Marco How Marina Gravel, MD mab:cb D: 10/01/2012 09:08:19 ET T: 10/01/2012 11:59:44 ET JOB#: [id number]  cc: Elta Guadeloupe A. Marina Gravel, MD, <Dictator> Hortencia Conradi MD ELECTRONICALLY SIGNED 10/02/2012 10:42

## 2014-11-28 NOTE — Discharge Summary (Signed)
PATIENT NAME:  Marco Sims, Marco Sims MR#:  [id number] DATE OF BIRTH:  06/09/1934  DATE OF ADMISSION:  11/18/2013 DATE OF DISCHARGE:  11/21/2013  ADMITTING DIAGNOSES: left leg cellulitis.   DISCHARGE DIAGNOSES:  1. Systemic inflammatory response syndrome 2. Left lower extremity cellulitis.  3. Lip cold sore.  4. Acute renal insufficiency, resolved.  5. Anemia, thrombocytopenia, resolved. 6. History of gastroesophageal reflux disease. 7. Lower extremity leg lichen simplex.  8. Hyperlipidemia.   DISCHARGE CONDITION: Stable.   DISCHARGE MEDICATIONS: The patient is to resume aspirin 81 mg p.o. daily, omeprazole 20 mg p.o. daily, fish oil 1 capsule once daily.  NEW MEDICATIONS: Acyclovir topical cream 5%, apply topically 5 times daily as needed, Keflex 500 mg p.o. 3 times daily for 10 more days.   HOME OXYGEN: None.   DIET: Two grams salt, low-fat, low-cholesterol. Diet consistency: Regular.  ACTIVITY LIMITATIONS: As tolerated.  FOLLOWUP APPOINTMENTS: With Dr. Otilio Miu in 2 days after discharge. The patient will also be given TED stockings to be taken home for compression of his left lower extremity.   CONSULTANTS: Care management, social work.   RADIOLOGIC STUDIES: Chest x-ray PA and lateral, 11/18/2013, revealing no evidence of acute cardiopulmonary disease. Ultrasound of left lower extremity, Doppler ultrasound of left lower extremity 11/18/2013, revealed no evidence of deep vein thrombosis.   HISTORY OF PRESENT ILLNESS: The patient is a 81 year old Caucasian male whose past medical history is significant for history of lichen simplex of the left lower extremity who presents to the hospital with complaints of left lower extremity increased swelling, increased warmth, as well as redness and even confusion. Please refer to Dr. Gus Height Patel's admission note on 11/18/2013. On arrival to the hospital, apparently patient was having fevers and chills. He was broke out in cold and clammy sweat and  was seen by Dr. Otilio Miu, who found an elevated white blood cell count. He presented to the emergency room where he was noted to be febrile to close to 102 and he was admitted to the hospital for further evaluation and treatment of left lower extremity cellulitis which was noted on the exam.   LABORATORY DATA: The patient's lab data on arrival to the hospital revealed elevation of BUN and creatinine to 23 and 1.88; otherwise, BMP was unremarkable. The patient's glucose level was 183, otherwise BMP was unremarkable. A troponin was less than 0.02 x 2. The patient's white blood cell count was elevated to 14.3, hemoglobin was 12.4, platelet count was 181,000. Absolute neutrophil count was high at 13.1. Coagulation panel was also elevated at 15.5 and INR was 1.3. Blood cultures taken on the 11/18/2013 showed no growth. Urine culture also showed no growth. Urinalysis was remarkable for 5 white blood cells. Lactic acid level was elevated at 2.3.   EKG showed normal sinus rhythm at 74 beats per minute, nonspecific T wave abnormality. Chest x-ray was unremarkable.   The patient was admitted to the hospital for further evaluation. He was started on an antibiotic therapy IV. With this, his condition significantly improved. His redness is on his left lower extremity as well as swelling and increased warmth. It is retracted, improved. His fever also subsided. He was felt to still need some antibiotic therapy upon discharge and antibiotic with Keflex was recommended. The patient is to continue antibiotics for 10 days and then a followup with his primary care physician for further recommendations. Since the patient did have some swelling in his left lower extremity, it was felt that he would  also benefit from TED compression stockings. A Doppler ultrasound of his lower extremity was performed and that showed no DVT.   In regards to acute renal failure, the patient's kidney function was mildly abnormal, impaired on  arrival to the hospital. His creatinine was 1.88 on the day of admission 11/18/2013. With IV fluid administration, the patient's kidney function normalized.   While in the hospital, the patient broke out in herpes simplex rash on his upper lip. This cold sore was felt to be related to stress he was experiencing due to infection. The patient was advised to continue acyclovir topical cream.   In regards to anemia, the patient was noted to be anemic on arrival to the hospital. His hemoglobin level was found to be 12.4. With IV fluid administration, the patient's hemoglobin level slightly worsened to 11.1. No bleeding was noted while he was in the hospital though.  For thrombocytopenia, a platelet count was noted to be 145 on 11/19/2013. However, that thrombocytopenia resolved as time progressed. It was felt to be related to infection itself.   In regards to the patient's chronic medical problems, such as hyperlipidemia, gastroesophageal reflux disease, as well as lichen simplex, the patient is to continue outpatient management. No changes were made. The patient is being discharged in stable condition with the above-mentioned medications and followup.   VITALS SIGNS: On the day of discharge: Temperature was 98.3, pulse was 67, respiration rate was 20, blood pressure 150/72, saturation 98% on room air at rest.   TIME SPENT: 40 minutes on this patient.    ____________________________ Theodoro Grist, MD rv:lt D: 11/21/2013 19:03:53 ET T: 11/22/2013 02:10:50 ET JOB#: [id number]  cc: Theodoro Grist, MD, <Dictator> [name], MD Toole MD ELECTRONICALLY SIGNED 12/06/2013 18:23

## 2014-11-28 NOTE — H&P (Signed)
PATIENT NAME:  Marco Sims, Marco Sims MR#:  [id number] DATE OF BIRTH:  [birth year]  DATE OF ADMISSION:  11/18/2013  PRIMARY CARE PHYSICIAN:  Otilio Miu, MD  CHIEF COMPLAINT: Fever with confusion.   HISTORY OF PRESENT ILLNESS: Marco Sims is a pleasant 81 year old Caucasian gentleman with history of GERD comes to the Emergency Room after he started having fever with chills, which broke out with cold and clammy sweat after he took ibuprofen yesterday. He went to see Dr. Otilio Miu today, was found to have elevated white count and was sent to the Emergency Room for further evaluation and management. In the ER, patient was noted to have fever 101.9. His pulse is 88. He received 1 liter of IV fluids. He was noted to have left lower extremity cellulitis with a skin breakdown on his left lateral calf. He received a dose of IV vancomycin. He is going to be admitted for SIRS secondary to cellulitis. Patient has fever of 101,  elevated lactic acid, and elevated white count.   PAST MEDICAL HISTORY:  1.  GERD.  2.  Chronic lichen simplex.    MEDICATIONS:  1.  Omeprazole 20 mg daily.  2.  Fish oil p.o. daily.  3.  Aspirin 81 mg daily.   ALLERGIES: No known drug allergies.   PAST SURGICAL HISTORY:  1.  Left arm injury and surgery.  2.  Left Achilles tendon rupture.  3.  Right ruptured quadriceps.  4.  Tonsillectomy.    FAMILY HISTORY: Mother had Parkinson's disease. Father had emphysema. Brother had carcinoma of the esophagus.  REVIEW OF SYSTEMS:   CONSTITUTIONAL:  Positive for fever, fatigue, weakness.  EYES: No blurred or double vision, cataracts, or glaucoma.  EARS, NOSE AND THROAT: No tinnitus, ear pain, hearing loss or postnasal drip.  RESPIRATORY: No cough, hemoptysis, or dyspnea.  CARDIOVASCULAR: No chest pain, orthopnea, or edema.  GASTROINTESTINAL: No nausea, vomiting, diarrhea, abdominal pain, or rectal bleeding.  GENITOURINARY:  No dysuria, hematuria, or frequency.  ENDOCRINE: No polyuria,  nocturia, or thyroid problems.  HEMATOLOGY: No anemia or easy bruising or bleeding.  SKIN: No acne or rash.  MUSCULOSKELETAL:  No arthritis, swelling, or gout.  NEUROLOGIC: No CVA, TIA, vertigo, or ataxia.  PSYCHIATRIC: No anxiety or depression.   All other systems reviewed and negative.   PHYSICAL EXAMINATION:  GENERAL: The patient is awake, alert, oriented x 3, not in acute distress.  VITAL SIGNS:  Temperature is 101.9, pulse is 88, blood pressure is 157/67. Pulse oximetry is 97% on room air.  HEENT: Atraumatic, normocephalic. PERRLA.  EOM intact. Oral mucosa is moist.  NECK: Supple. No JVD. No carotid bruits.  RESPIRATORY: Clear to auscultation bilaterally. No rales, rhonchi, respiratory distress or labored breathing.  HEART: Both the heart sounds are normal. Rate, rhythm regular. PMI not lateralized. Chest is nontender.  EXTREMITIES: Good pedal pulses, good femoral pulses. No lower extremity edema. The patient has cellulitis over the left calf. He has some skin breakdown in the left lateral calf. Cellulitis has been marked with a marking pen.  NEUROLOGIC: Grossly intact cranial nerves II-XII. No motor or sensory deficit.  PSYCHIATRIC: The patient is awake, alert, oriented x 3.  LABORATORY DATA:   Lactic acid is 2.3. Urinalysis negative for UTI. Troponin is 0.02. Creatinine is 1.88, BUN  23, sodium is 136, potassium 3.9, chloride is 102. White count is 13.6, hemoglobin and hematocrit 12.2 and 38.2, platelet count is 169,000. Prothrombin time and INR is 15.5 and 1.3.   Ultrasound left  lower extremity negative for DVT.   ASSESSMENT AND PLAN:  81 year old Marco Sims with history of GERD comes in with fever, confusion, and elevated white count. He is being admitted with:  1.  SIRS secondary to cellulitis, left lower extremity. The patient has skin breakdown due to his chronic skin, which is chronic lichen simplex. The patient was already started on IV vancomycin. We will continue IV  vancomycin for now. Follow blood cultures. Follow fever curve and white count. If blood cultures remain negative, consider changing to p.o. Keflex or Augmentin.  2.  Acute renal failure in the setting of SIRS secondary to cellulitis. The patient received IV fluids in the Emergency Room. He is able to drink. He is having adequate p.o. intake right now. We will give some IV fluids. Check creatinine in the morning.  3.  GERD. Continue PPIs.  4.  DVT prophylaxis. Subcutaneous heparin t.i.d.  5.  Further work-up according to the patient's clinical course. Hospital admission plan was discussed with patient.   TIME SPENT: 45 minutes.    ____________________________ Hart Rochester Posey Pronto, MD sap:tc D: 11/18/2013 17:54:40 ET T: 11/18/2013 18:09:42 ET JOB#: [id number]  cc: Trachelle Low A. Posey Pronto, MD, <Dictator>             [name], MD Ilda Basset MD ELECTRONICALLY SIGNED 12/01/2013 9:08

## 2014-12-11 ENCOUNTER — Encounter: Payer: Self-pay | Admitting: Internal Medicine

## 2014-12-24 DIAGNOSIS — H2513 Age-related nuclear cataract, bilateral: Secondary | ICD-10-CM | POA: Diagnosis not present

## 2015-02-18 ENCOUNTER — Ambulatory Visit (INDEPENDENT_AMBULATORY_CARE_PROVIDER_SITE_OTHER): Payer: Commercial Managed Care - HMO | Admitting: Family Medicine

## 2015-02-18 ENCOUNTER — Ambulatory Visit
Admission: RE | Admit: 2015-02-18 | Discharge: 2015-02-18 | Disposition: A | Discharge status: Home / Self Care | Payer: Commercial Managed Care - HMO | Source: Ambulatory Visit | Attending: Family Medicine | Admitting: Family Medicine

## 2015-02-18 ENCOUNTER — Encounter: Payer: Self-pay | Admitting: Family Medicine

## 2015-02-18 VITALS — BP 130/60 | HR 56 | Resp 12 | Ht 68.0 in | Wt 179.0 lb

## 2015-02-18 DIAGNOSIS — R05 Cough: Secondary | ICD-10-CM

## 2015-02-18 DIAGNOSIS — R059 Cough, unspecified: Secondary | ICD-10-CM

## 2015-02-18 DIAGNOSIS — R079 Chest pain, unspecified: Secondary | ICD-10-CM

## 2015-02-18 DIAGNOSIS — J449 Chronic obstructive pulmonary disease, unspecified: Secondary | ICD-10-CM | POA: Diagnosis not present

## 2015-02-18 DIAGNOSIS — R0602 Shortness of breath: Secondary | ICD-10-CM | POA: Diagnosis not present

## 2015-02-18 NOTE — Progress Notes (Signed)
Name: Marco Sims   MRN: [id number]    DOB: [birth year]   Date:02/18/2015       Progress Note  Subjective  Chief Complaint  Chief Complaint  Patient presents with  . Cough    shortness of breath    Cough Associated symptoms include chest pain and shortness of breath. Pertinent negatives include no chills, ear pain, fever, headaches, heartburn, hemoptysis, myalgias, rash, sore throat, weight loss or wheezing. There is no history of asthma, COPD or environmental allergies.  Chest Pain  This is a new problem. The current episode started 1 to 4 weeks ago. The onset quality is gradual. The problem occurs daily. The problem has been waxing and waning. The pain is present in the substernal region. The quality of the pain is described as tightness. The pain does not radiate. Associated symptoms include a cough and shortness of breath. Pertinent negatives include no abdominal pain, back pain, claudication, diaphoresis, dizziness, exertional chest pressure, fever, headaches, hemoptysis, irregular heartbeat, malaise/fatigue, nausea, near-syncope, orthopnea, palpitations, PND or sputum production. The cough has no precipitants. The cough is non-productive. There are no known risk factors.  Pertinent negatives for past medical history include no CAD, no DVT and no PE.  Shortness of Breath This is a recurrent problem. The current episode started 1 to 4 weeks ago. The problem occurs daily. The problem has been unchanged. Associated symptoms include chest pain. Pertinent negatives include no abdominal pain, claudication, ear pain, fever, headaches, hemoptysis, leg swelling, neck pain, orthopnea, PND, rash, sore throat, sputum production, swollen glands or wheezing. Nothing aggravates the symptoms. There is no history of allergies, asthma, bronchiolitis, CAD, COPD, DVT, a heart failure or PE.    No problem-specific assessment & plan notes found for this encounter.   Past Medical History  Diagnosis Date  .  Lichen simplex chronicus   . Allergy     seaasonal  . Anemia   . GERD (gastroesophageal reflux disease)     Past Surgical History  Procedure Laterality Date  . Herniorpahy, rt  05/09/2009  . Tonsillectomy 1942    . Fracture surgery    . Hernia repair    . Fractured left arm radius and  ulna 1955    . Colles' fracture right 1956    . Ruptured left achilles tendon 1980    . Surgery for cubital tunnel syndrome and left arm 1999    . Fractured right tibiofibular 2009    . Hand surgery  06/02/11  . Colonoscopy  2013    Family History  Problem Relation Age of Onset  . Mental illness Mother   . Cancer Brother     History   Social History  . Marital Status: Widowed    Spouse Name: N/A  . Number of Children: N/A  . Years of Education: N/A   Occupational History  . Not on file.   Social History Main Topics  . Smoking status: Never Smoker   . Smokeless tobacco: Never Used  . Alcohol Use: Yes     Comment: rare; 1 to 2 beers a month  . Drug Use: No  . Sexual Activity: Yes   Other Topics Concern  . Not on file   Social History Narrative    No Known Allergies   Review of Systems  Constitutional: Negative for fever, chills, weight loss, malaise/fatigue and diaphoresis.  HENT: Negative for ear discharge, ear pain and sore throat.   Eyes: Negative for blurred vision.  Respiratory: Positive for cough and  shortness of breath. Negative for hemoptysis, sputum production and wheezing.   Cardiovascular: Positive for chest pain. Negative for palpitations, orthopnea, claudication, leg swelling, PND and near-syncope.  Gastrointestinal: Negative for heartburn, nausea, abdominal pain, diarrhea, constipation, blood in stool and melena.  Genitourinary: Negative for dysuria, urgency, frequency and hematuria.  Musculoskeletal: Negative for myalgias, back pain, joint pain and neck pain.  Skin: Negative for rash.  Neurological: Negative for dizziness, tingling, sensory change, focal  weakness and headaches.  Endo/Heme/Allergies: Negative for environmental allergies and polydipsia. Does not bruise/bleed easily.  Psychiatric/Behavioral: Negative for depression and suicidal ideas. The patient is not nervous/anxious and does not have insomnia.      Objective  Filed Vitals:   02/18/15 1034  BP: 130/60  Pulse: 56  Resp: 12  Height: 5\' 8"  (1.727 m)  Weight: 179 lb (81.194 kg)  SpO2: 98%    Physical Exam  Constitutional: He is oriented to person, place, and time and well-developed, well-nourished, and in no distress.  HENT:  Head: Normocephalic.  Right Ear: External ear normal.  Left Ear: External ear normal.  Nose: Nose normal.  Mouth/Throat: Oropharynx is clear and moist.  Eyes: Conjunctivae and EOM are normal. Pupils are equal, round, and reactive to light. Right eye exhibits no discharge. Left eye exhibits no discharge. No scleral icterus.  Neck: Normal range of motion. Neck supple. No JVD present. No tracheal deviation present. No thyromegaly present.  Cardiovascular: Normal rate, regular rhythm, normal heart sounds and intact distal pulses.  Exam reveals no gallop and no friction rub.   No murmur heard. Pulmonary/Chest: Breath sounds normal. No respiratory distress. He has no wheezes. He has no rales.  Abdominal: Soft. Bowel sounds are normal. He exhibits no mass. There is no hepatosplenomegaly. There is no tenderness. There is no rebound, no guarding and no CVA tenderness.  Musculoskeletal: Normal range of motion. He exhibits no edema or tenderness.  Lymphadenopathy:    He has no cervical adenopathy.  Neurological: He is alert and oriented to person, place, and time. He has normal sensation, normal strength, normal reflexes and intact cranial nerves. No cranial nerve deficit.  Skin: Skin is warm. No rash noted.  Psychiatric: Mood and affect normal.      Assessment & Plan  Problem List Items Addressed This Visit    None    Visit Diagnoses    Chest  pain, unspecified chest pain type    -  Primary    Relevant Orders    EKG 12-Lead (Completed)    Ambulatory referral to Cardiology    Cough        Relevant Orders    DG Chest 2 View         Dr. Otilio Miu [facility]  02/18/2015

## 2015-02-24 ENCOUNTER — Encounter: Payer: Self-pay | Admitting: Family Medicine

## 2015-02-24 ENCOUNTER — Ambulatory Visit (INDEPENDENT_AMBULATORY_CARE_PROVIDER_SITE_OTHER): Payer: Commercial Managed Care - HMO | Admitting: Family Medicine

## 2015-02-24 VITALS — BP 132/70 | HR 60 | Resp 16 | Ht 70.0 in | Wt 180.0 lb

## 2015-02-24 DIAGNOSIS — R079 Chest pain, unspecified: Secondary | ICD-10-CM | POA: Diagnosis not present

## 2015-02-24 DIAGNOSIS — R05 Cough: Secondary | ICD-10-CM

## 2015-02-24 DIAGNOSIS — J449 Chronic obstructive pulmonary disease, unspecified: Secondary | ICD-10-CM

## 2015-02-24 DIAGNOSIS — K219 Gastro-esophageal reflux disease without esophagitis: Secondary | ICD-10-CM

## 2015-02-24 DIAGNOSIS — R058 Other specified cough: Secondary | ICD-10-CM

## 2015-02-24 MED ORDER — ALBUTEROL SULFATE (2.5 MG/3ML) 0.083% IN NEBU
2.5000 mg | INHALATION_SOLUTION | Freq: Once | RESPIRATORY_TRACT | Status: AC
  Administered 2015-02-24: 2.5 mg via RESPIRATORY_TRACT

## 2015-02-24 MED ORDER — AZITHROMYCIN 250 MG PO TABS: ORAL_TABLET | ORAL | Status: DC

## 2015-02-24 MED ORDER — GUAIFENESIN 100 MG/5ML PO SOLN: 5.0000 mL | ORAL | Status: DC | PRN

## 2015-02-24 MED ORDER — GUAIFENESIN-CODEINE 100-10 MG/5ML PO SOLN: 5.0000 mL | Freq: Three times a day (TID) | ORAL | Status: DC | PRN

## 2015-02-24 NOTE — Progress Notes (Signed)
Name: Marco Sims   MRN: [id number]    DOB: Sep 15, 1933   Date:02/24/2015       Progress Note  Subjective  Chief Complaint  Chief Complaint  Patient presents with  . Follow-up    1 week   . Cough    Not improving. Patient states that cough is not productive. He states that cough has been constant for 3 weeks. States that yesterday he felt good but, worsened last night.     Cough This is a recurrent problem. The current episode started 1 to 4 weeks ago. The problem has been waxing and waning. The problem occurs constantly. The cough is non-productive. Associated symptoms include chest pain, heartburn and shortness of breath. Pertinent negatives include no chills, ear congestion, ear pain, fever, headaches, hemoptysis, myalgias, nasal congestion, postnasal drip, rash, rhinorrhea, sore throat, sweats, weight loss or wheezing. Nothing aggravates the symptoms. The treatment provided no relief. There is no history of asthma, COPD or environmental allergies.  Chest Pain  The current episode started 1 to 4 weeks ago. The problem occurs daily. The problem has been gradually worsening. The pain is present in the substernal region. The pain is mild. Quality: nondescript feeling in chest. Associated symptoms include a cough and shortness of breath. Pertinent negatives include no abdominal pain, back pain, dizziness, fever, headaches, hemoptysis, malaise/fatigue, nausea, palpitations or sputum production. It is unknown what precipitates the cough. The cough is non-productive. There is no color change associated with the cough. The pain is aggravated by nothing. He has tried nothing for the symptoms. Risk factors include being elderly and male gender.  His past medical history is significant for COPD.  Pertinent negatives for past medical history include no aneurysm, no anxiety/panic attacks, no CAD, no CHF, no diabetes, no MI, no mitral valve prolapse, no PE and no PVD. Prior diagnostic workup includes stress  echo (scheduled tomorrow).    No problem-specific assessment & plan notes found for this encounter.   Past Medical History  Diagnosis Date  . Lichen simplex chronicus   . Allergy     seaasonal  . Anemia   . GERD (gastroesophageal reflux disease)     Past Surgical History  Procedure Laterality Date  . Herniorpahy, rt  05/09/2009  . Tonsillectomy 1942    . Fracture surgery    . Hernia repair    . Fractured left arm radius and  ulna 1955    . Colles' fracture right 1956    . Ruptured left achilles tendon 1980    . Surgery for cubital tunnel syndrome and left arm 1999    . Fractured right tibiofibular 2009    . Hand surgery  06/02/11  . Colonoscopy  2013    Family History  Problem Relation Age of Onset  . Mental illness Mother   . Cancer Brother     History   Social History  . Marital Status: Widowed    Spouse Name: N/A  . Number of Children: N/A  . Years of Education: N/A   Occupational History  . Not on file.   Social History Main Topics  . Smoking status: Never Smoker   . Smokeless tobacco: Never Used  . Alcohol Use: 0.0 oz/week    0 Standard drinks or equivalent per week     Comment: rare; 1 to 2 beers a month  . Drug Use: No  . Sexual Activity: Yes   Other Topics Concern  . Not on file   Social History Narrative  No Known Allergies   Review of Systems  Constitutional: Negative for fever, chills, weight loss and malaise/fatigue.  HENT: Negative for ear discharge, ear pain, postnasal drip, rhinorrhea and sore throat.   Eyes: Negative for blurred vision.  Respiratory: Positive for cough and shortness of breath. Negative for hemoptysis, sputum production and wheezing.   Cardiovascular: Positive for chest pain. Negative for palpitations and leg swelling.  Gastrointestinal: Positive for heartburn. Negative for nausea, abdominal pain, diarrhea, constipation, blood in stool and melena.  Genitourinary: Negative for dysuria, urgency, frequency and  hematuria.  Musculoskeletal: Negative for myalgias, back pain, joint pain and neck pain.  Skin: Negative for rash.  Neurological: Negative for dizziness, tingling, sensory change, focal weakness and headaches.  Endo/Heme/Allergies: Negative for environmental allergies and polydipsia. Does not bruise/bleed easily.  Psychiatric/Behavioral: Negative for depression and suicidal ideas. The patient is not nervous/anxious and does not have insomnia.      Objective  Filed Vitals:   02/24/15 1104  BP: 132/70  Pulse: 60  Resp: 16  Height: 5\' 10"  (1.778 m)  Weight: 180 lb (81.647 kg)    Physical Exam  Constitutional: He is oriented to person, place, and time and well-developed, well-nourished, and in no distress.  HENT:  Head: Normocephalic.  Right Ear: External ear normal.  Left Ear: External ear normal.  Nose: Nose normal.  Mouth/Throat: Oropharynx is clear and moist.  Eyes: Conjunctivae and EOM are normal. Pupils are equal, round, and reactive to light. Right eye exhibits no discharge. Left eye exhibits no discharge. No scleral icterus.  Neck: Normal range of motion. Neck supple. No JVD present. No tracheal deviation present. No thyromegaly present.  Cardiovascular: Normal rate, regular rhythm, normal heart sounds and intact distal pulses.  Exam reveals no gallop and no friction rub.   No murmur heard. Pulmonary/Chest: Breath sounds normal. No respiratory distress. He has no wheezes. He has no rales.  Abdominal: Soft. Bowel sounds are normal. He exhibits no mass. There is no hepatosplenomegaly. There is no tenderness. There is no rebound, no guarding and no CVA tenderness.  Musculoskeletal: Normal range of motion. He exhibits no edema or tenderness.  Lymphadenopathy:    He has no cervical adenopathy.  Neurological: He is alert and oriented to person, place, and time. He has normal sensation, normal strength, normal reflexes and intact cranial nerves. No cranial nerve deficit.  Skin:  Skin is warm. No rash noted.  Psychiatric: Mood and affect normal.      Assessment & Plan  Problem List Items Addressed This Visit      Digestive   GERD   Relevant Medications   azithromycin (ZITHROMAX) 250 MG tablet    Other Visit Diagnoses    Chest pain, unspecified chest pain type    -  Primary    Relevant Orders    EKG 12-Lead (Completed)    D-Dimer, Quantitative    COPD, mild        Relevant Medications    albuterol (PROVENTIL) (2.5 MG/3ML) 0.083% nebulizer solution 2.5 mg    azithromycin (ZITHROMAX) 250 MG tablet    Nocturnal cough             Dr. Hurbert Duran [facility]  02/24/2015

## 2015-02-25 DIAGNOSIS — E784 Other hyperlipidemia: Secondary | ICD-10-CM | POA: Diagnosis not present

## 2015-02-25 DIAGNOSIS — I209 Angina pectoris, unspecified: Secondary | ICD-10-CM | POA: Diagnosis not present

## 2015-02-25 LAB — D-DIMER, QUANTITATIVE (NOT AT ARMC): D-DIMER: 0.49 mg{FEU}/L (ref 0.00–0.49)

## 2015-02-26 ENCOUNTER — Ambulatory Visit (INDEPENDENT_AMBULATORY_CARE_PROVIDER_SITE_OTHER): Payer: Commercial Managed Care - HMO | Admitting: Family Medicine

## 2015-02-26 ENCOUNTER — Encounter: Payer: Self-pay | Admitting: Family Medicine

## 2015-02-26 VITALS — BP 140/72 | HR 69 | Temp 98.0°F | Resp 16 | Wt 181.2 lb

## 2015-02-26 DIAGNOSIS — J219 Acute bronchiolitis, unspecified: Secondary | ICD-10-CM | POA: Diagnosis not present

## 2015-02-26 NOTE — Progress Notes (Signed)
Name: Marco Sims   MRN: [id number]    DOB: March 22, 1934   Date:02/26/2015       Progress Note  Subjective  Chief Complaint  Chief Complaint  Patient presents with  . Follow-up    Cough This is a new problem. The current episode started 1 to 4 weeks ago. The problem has been gradually improving. The cough is non-productive. Associated symptoms include shortness of breath. Pertinent negatives include no chest pain, chills, ear congestion, ear pain, fever, headaches, heartburn, hemoptysis, myalgias, nasal congestion, postnasal drip, rash, rhinorrhea, sweats, weight loss or wheezing. Nothing aggravates the symptoms. Treatments tried: see prevoious meds. The treatment provided mild relief. His past medical history is significant for COPD. There is no history of asthma, bronchiectasis, bronchitis, emphysema, environmental allergies or pneumonia.    No problem-specific assessment & plan notes found for this encounter.   Past Medical History  Diagnosis Date  . Lichen simplex chronicus   . Allergy     seaasonal  . Anemia   . GERD (gastroesophageal reflux disease)     Past Surgical History  Procedure Laterality Date  . Herniorpahy, rt  05/09/2009  . Tonsillectomy 1942    . Fracture surgery    . Hernia repair    . Fractured left arm radius and  ulna 1955    . Colles' fracture right 1956    . Ruptured left achilles tendon 1980    . Surgery for cubital tunnel syndrome and left arm 1999    . Fractured right tibiofibular 2009    . Hand surgery  06/02/11  . Colonoscopy  2013    Family History  Problem Relation Age of Onset  . Mental illness Mother   . Cancer Brother     History   Social History  . Marital Status: Widowed    Spouse Name: N/A  . Number of Children: N/A  . Years of Education: N/A   Occupational History  . Not on file.   Social History Main Topics  . Smoking status: Never Smoker   . Smokeless tobacco: Never Used  . Alcohol Use: 0.0 oz/week    0 Standard  drinks or equivalent per week     Comment: rare; 1 to 2 beers a month  . Drug Use: No  . Sexual Activity: Yes   Other Topics Concern  . Not on file   Social History Narrative    No Known Allergies   Review of Systems  Constitutional: Negative for fever, chills and weight loss.  HENT: Negative for ear pain, postnasal drip and rhinorrhea.   Respiratory: Positive for cough and shortness of breath. Negative for hemoptysis, sputum production and wheezing.   Cardiovascular: Negative for chest pain, palpitations and orthopnea.  Gastrointestinal: Negative for heartburn.  Musculoskeletal: Negative for myalgias.  Skin: Negative for rash.  Neurological: Negative for headaches.  Endo/Heme/Allergies: Negative for environmental allergies.     Objective  Filed Vitals:   02/26/15 1023  BP: 140/72  Pulse: 69  Temp: 98 F (36.7 C)  Resp: 16  Weight: 181 lb 3.2 oz (82.192 kg)  SpO2: 98%    Physical Exam  Constitutional: He is oriented to person, place, and time and well-developed, well-nourished, and in no distress.  HENT:  Head: Normocephalic.  Right Ear: External ear normal.  Left Ear: External ear normal.  Nose: Nose normal.  Mouth/Throat: Oropharynx is clear and moist.  Eyes: Conjunctivae and EOM are normal. Pupils are equal, round, and reactive to light. Right eye exhibits  no discharge. Left eye exhibits no discharge. No scleral icterus.  Neck: Normal range of motion. Neck supple. No JVD present. No tracheal deviation present. No thyromegaly present.  Cardiovascular: Normal rate, regular rhythm, normal heart sounds and intact distal pulses.  Exam reveals no gallop and no friction rub.   No murmur heard. Pulmonary/Chest: Breath sounds normal. No respiratory distress. He has no wheezes. He has no rales.  Abdominal: Soft. Bowel sounds are normal. He exhibits no mass. There is no hepatosplenomegaly. There is no tenderness. There is no rebound, no guarding and no CVA tenderness.   Musculoskeletal: Normal range of motion. He exhibits no edema or tenderness.  Lymphadenopathy:    He has no cervical adenopathy.  Neurological: He is alert and oriented to person, place, and time. He has normal sensation, normal strength, normal reflexes and intact cranial nerves. No cranial nerve deficit.  Skin: Skin is warm. No rash noted.  Psychiatric: Mood and affect normal.      Assessment & Plan  Problem List Items Addressed This Visit    None    Visit Diagnoses    Bronchiolitis    -  Primary    cont med         Dr. Otilio Miu [facility]  02/26/2015

## 2015-03-04 DIAGNOSIS — R0782 Intercostal pain: Secondary | ICD-10-CM | POA: Diagnosis not present

## 2015-03-04 DIAGNOSIS — I209 Angina pectoris, unspecified: Secondary | ICD-10-CM | POA: Diagnosis not present

## 2015-03-04 DIAGNOSIS — I10 Essential (primary) hypertension: Secondary | ICD-10-CM | POA: Diagnosis not present

## 2015-03-04 DIAGNOSIS — I208 Other forms of angina pectoris: Secondary | ICD-10-CM | POA: Diagnosis not present

## 2015-03-04 DIAGNOSIS — E784 Other hyperlipidemia: Secondary | ICD-10-CM | POA: Diagnosis not present

## 2015-03-08 DIAGNOSIS — H25011 Cortical age-related cataract, right eye: Secondary | ICD-10-CM | POA: Diagnosis not present

## 2015-03-08 DIAGNOSIS — H524 Presbyopia: Secondary | ICD-10-CM | POA: Diagnosis not present

## 2015-03-08 DIAGNOSIS — H2512 Age-related nuclear cataract, left eye: Secondary | ICD-10-CM | POA: Diagnosis not present

## 2015-03-08 DIAGNOSIS — H25041 Posterior subcapsular polar age-related cataract, right eye: Secondary | ICD-10-CM | POA: Diagnosis not present

## 2015-03-08 DIAGNOSIS — H2511 Age-related nuclear cataract, right eye: Secondary | ICD-10-CM | POA: Diagnosis not present

## 2015-04-01 DIAGNOSIS — H2512 Age-related nuclear cataract, left eye: Secondary | ICD-10-CM | POA: Diagnosis not present

## 2015-04-01 DIAGNOSIS — H25812 Combined forms of age-related cataract, left eye: Secondary | ICD-10-CM | POA: Diagnosis not present

## 2015-04-02 DIAGNOSIS — H2511 Age-related nuclear cataract, right eye: Secondary | ICD-10-CM | POA: Diagnosis not present

## 2015-04-22 DIAGNOSIS — H25811 Combined forms of age-related cataract, right eye: Secondary | ICD-10-CM | POA: Diagnosis not present

## 2015-04-22 DIAGNOSIS — H2511 Age-related nuclear cataract, right eye: Secondary | ICD-10-CM | POA: Diagnosis not present

## 2015-05-31 IMAGING — US US EXTREM LOW VENOUS*L*
1 series · 13 of 24 positions shown · non-contrast
Comparison: None.

CLINICAL DATA: Swelling. Pain. Color changes. The patient takes
aspirin.



[Series 1: us extrem low venous*left* · 0.09mm/px · 13 of 38 slices shown]
[im 1/38]
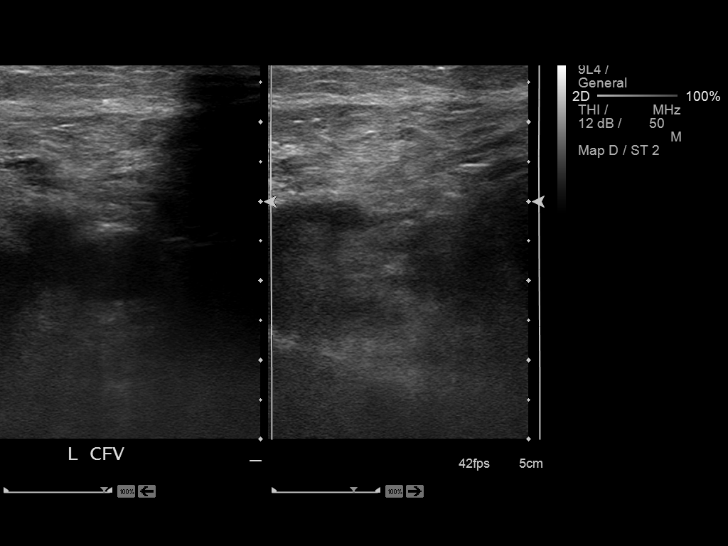
[im 4/38]
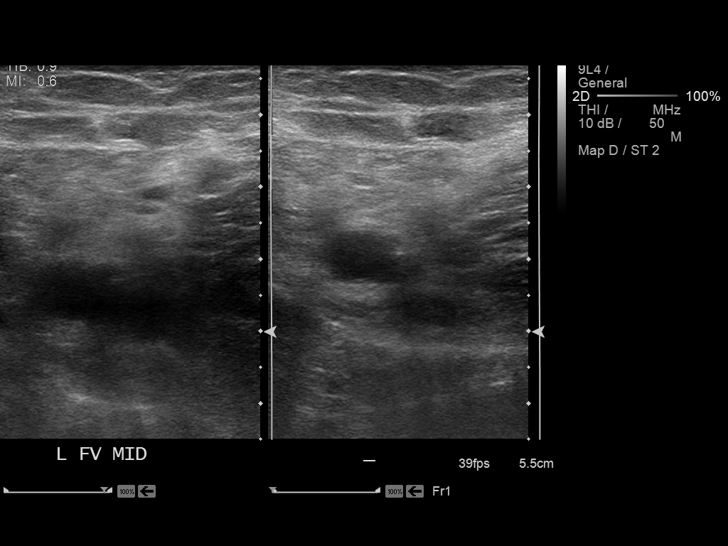
[im 7/38]
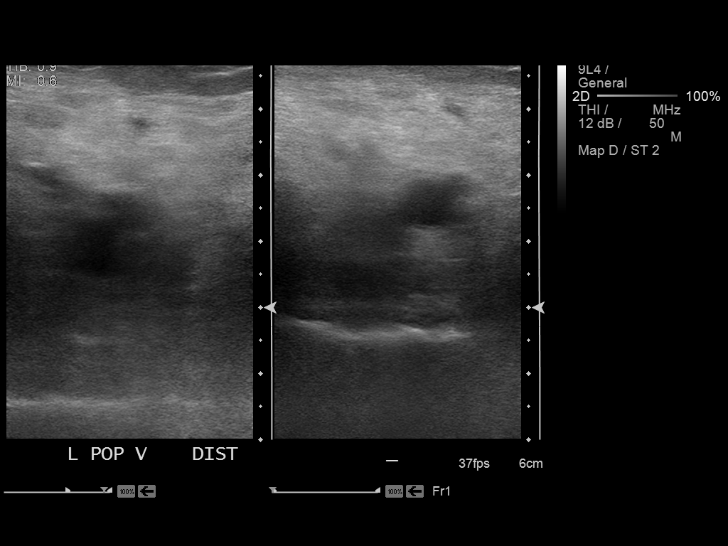
[im 10/38]
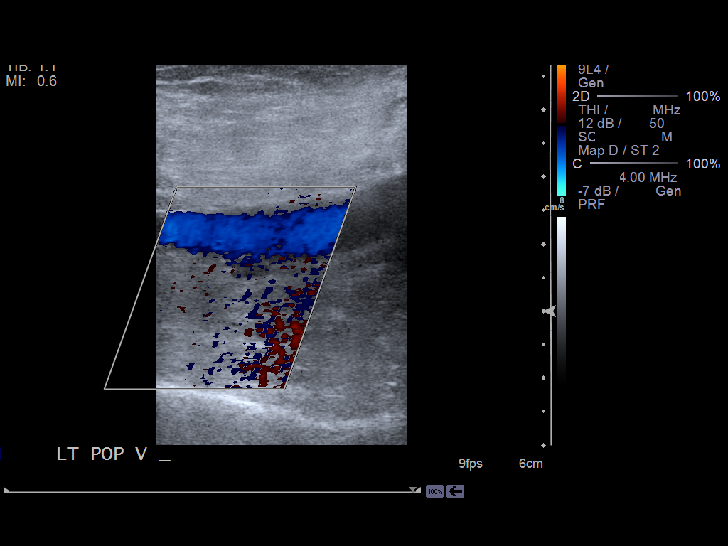
[im 13/38]
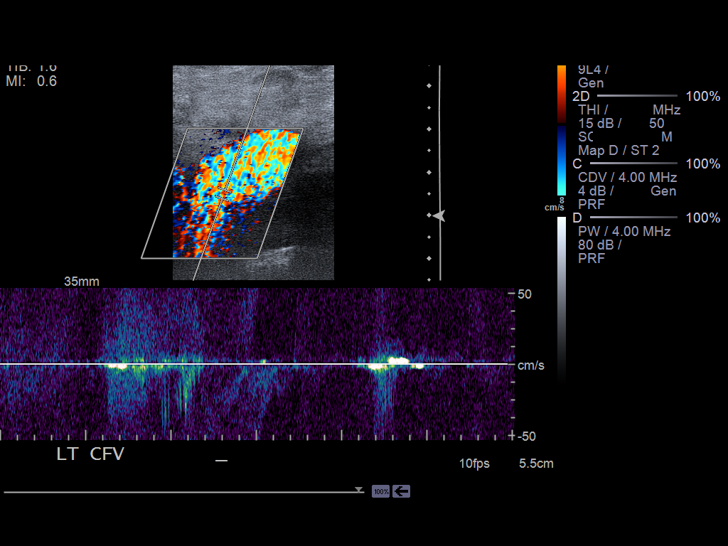
[im 17/38]
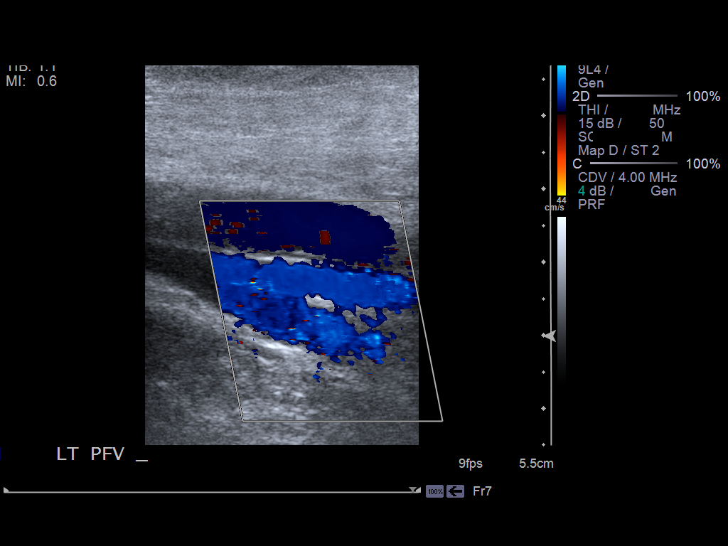
[im 20/38]
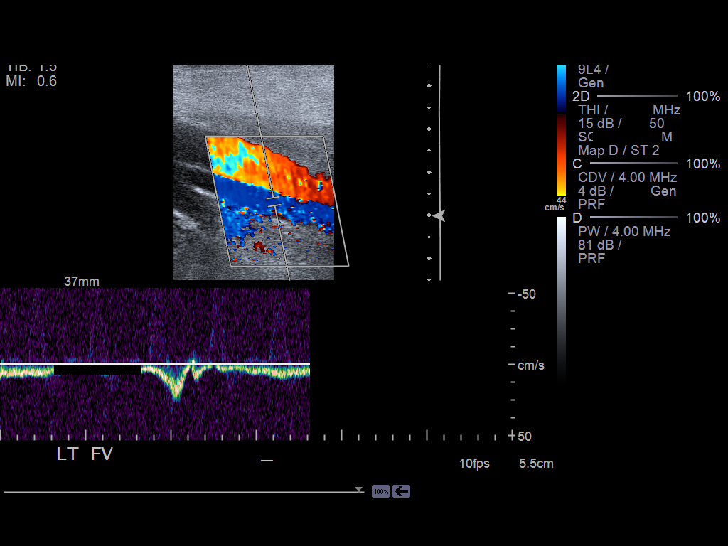
[im 21/38]
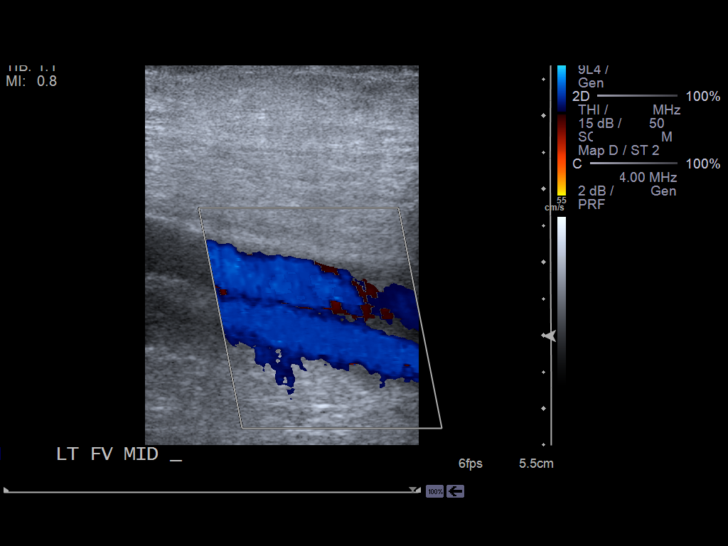
[im 25/38]
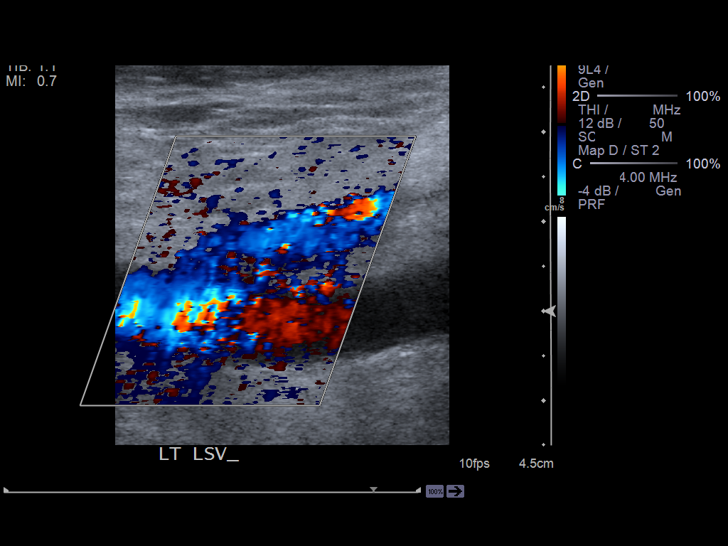
[im 28/38]
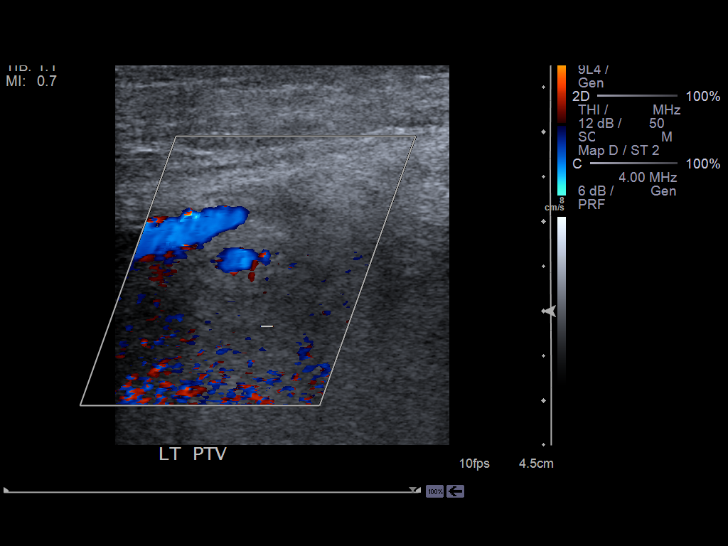
[im 31/38]
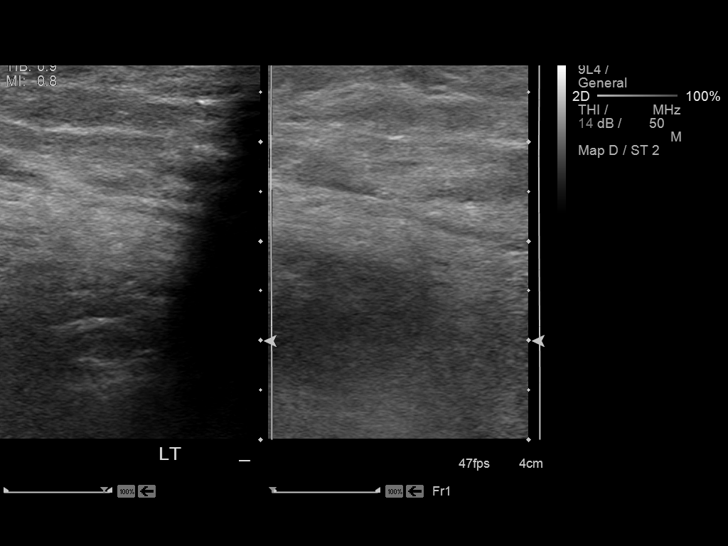
[im 34/38]
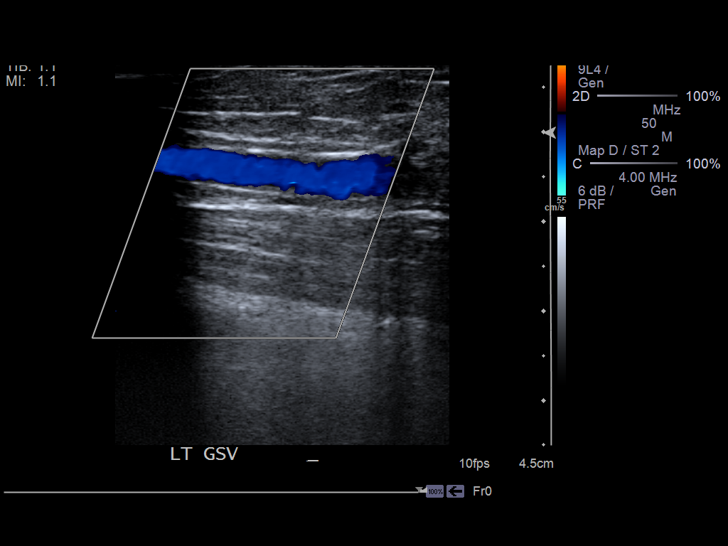
[im 38/38]
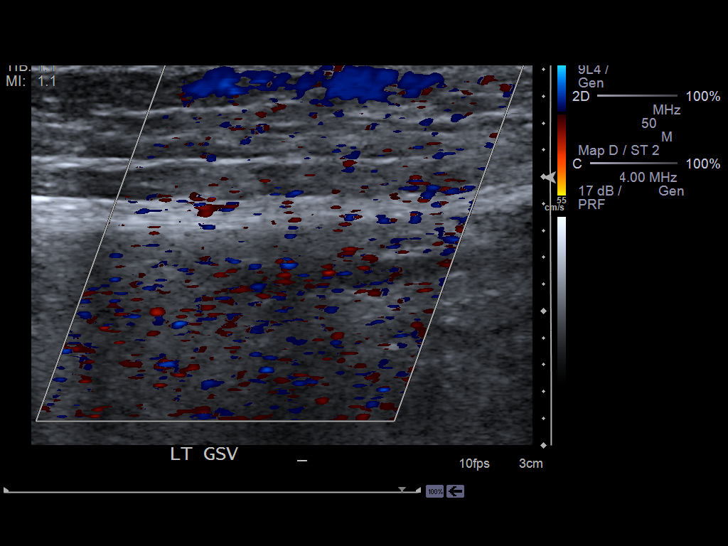

[13 of 24 positions shown; findings below may reference images not displayed]

FINDINGS: Common Femoral Vein: No evidence of thrombus. Normal
compressibility, respiratory phasicity and response to augmentation.

Saphenofemoral Junction: No evidence of thrombus. Normal
compressibility and flow on color Doppler imaging.

Profunda Femoral Vein: No evidence of thrombus. Normal
compressibility and flow on color Doppler imaging.

Femoral Vein: No evidence of thrombus. Normal compressibility,
respiratory phasicity and response to augmentation.

Popliteal Vein: No evidence of thrombus. Normal compressibility,
respiratory phasicity and response to augmentation.

Calf Veins: No evidence of thrombus. Normal compressibility and flow
on color Doppler imaging.

Superficial Great Saphenous Vein: No evidence of thrombus. Normal
compressibility and flow on color Doppler imaging.

Venous Reflux:  None.

Other Findings:  None.
IMPRESSION: No evidence of deep venous thrombosis.

## 2015-06-03 ENCOUNTER — Encounter: Payer: Self-pay | Admitting: Family Medicine

## 2015-06-03 ENCOUNTER — Ambulatory Visit (INDEPENDENT_AMBULATORY_CARE_PROVIDER_SITE_OTHER): Payer: Commercial Managed Care - HMO | Admitting: Family Medicine

## 2015-06-03 VITALS — BP 130/70 | HR 70 | Ht 70.0 in | Wt 178.0 lb

## 2015-06-03 DIAGNOSIS — Z23 Encounter for immunization: Secondary | ICD-10-CM | POA: Diagnosis not present

## 2015-06-03 DIAGNOSIS — D0462 Carcinoma in situ of skin of left upper limb, including shoulder: Secondary | ICD-10-CM

## 2015-06-03 DIAGNOSIS — C44619 Basal cell carcinoma of skin of left upper limb, including shoulder: Secondary | ICD-10-CM

## 2015-06-03 NOTE — Progress Notes (Signed)
Name: Marco Sims   MRN: [id number]    DOB: Aug 31, 1933   Date:06/03/2015       Progress Note  Subjective  Chief Complaint  Chief Complaint  Patient presents with  . Rash    on L) shoulder    Rash This is a chronic problem. The current episode started more than 1 year ago. The problem is unchanged. The affected locations include the left shoulder. The rash is characterized by scaling and itchiness. He was exposed to nothing. Pertinent negatives include no cough, diarrhea, eye pain, fatigue, fever, joint pain, shortness of breath or sore throat. Past treatments include nothing.    No problem-specific assessment & plan notes found for this encounter.   Past Medical History  Diagnosis Date  . Lichen simplex chronicus   . Allergy     seaasonal  . Anemia   . GERD (gastroesophageal reflux disease)     Past Surgical History  Procedure Laterality Date  . Herniorpahy, rt  05/09/2009  . Tonsillectomy 1942    . Fracture surgery    . Hernia repair    . Fractured left arm radius and  ulna 1955    . Colles' fracture right 1956    . Ruptured left achilles tendon 1980    . Surgery for cubital tunnel syndrome and left arm 1999    . Fractured right tibiofibular 2009    . Hand surgery  06/02/11  . Colonoscopy  2013    Family History  Problem Relation Age of Onset  . Mental illness Mother   . Cancer Brother     Social History   Social History  . Marital Status: Widowed    Spouse Name: N/A  . Number of Children: N/A  . Years of Education: N/A   Occupational History  . Not on file.   Social History Main Topics  . Smoking status: Never Smoker   . Smokeless tobacco: Never Used  . Alcohol Use: 0.0 oz/week    0 Standard drinks or equivalent per week     Comment: rare; 1 to 2 beers a month  . Drug Use: No  . Sexual Activity: Yes   Other Topics Concern  . Not on file   Social History Narrative    No Known Allergies   Review of Systems  Constitutional: Negative for  fever, chills, weight loss, malaise/fatigue and fatigue.  HENT: Negative for ear discharge, ear pain and sore throat.   Eyes: Negative for blurred vision and pain.  Respiratory: Negative for cough, sputum production, shortness of breath and wheezing.   Cardiovascular: Negative for chest pain, palpitations and leg swelling.  Gastrointestinal: Negative for heartburn, nausea, abdominal pain, diarrhea, constipation, blood in stool and melena.  Genitourinary: Negative for dysuria, urgency, frequency and hematuria.  Musculoskeletal: Negative for myalgias, back pain, joint pain and neck pain.  Skin: Positive for rash.  Neurological: Negative for dizziness, tingling, sensory change, focal weakness and headaches.  Endo/Heme/Allergies: Negative for environmental allergies and polydipsia. Does not bruise/bleed easily.  Psychiatric/Behavioral: Negative for depression and suicidal ideas. The patient is not nervous/anxious and does not have insomnia.      Objective  Filed Vitals:   06/03/15 1511  BP: 130/70  Pulse: 70  Height: 5\' 10"  (1.778 m)  Weight: 178 lb (80.74 kg)    Physical Exam  Constitutional: He is oriented to person, place, and time and well-developed, well-nourished, and in no distress.  HENT:  Head: Normocephalic.  Right Ear: External ear normal.  Left  Ear: External ear normal.  Nose: Nose normal.  Mouth/Throat: Oropharynx is clear and moist.  Eyes: Conjunctivae and EOM are normal. Pupils are equal, round, and reactive to light. Right eye exhibits no discharge. Left eye exhibits no discharge. No scleral icterus.  Neck: Normal range of motion. Neck supple. No JVD present. No tracheal deviation present. No thyromegaly present.  Cardiovascular: Normal rate, regular rhythm, normal heart sounds and intact distal pulses.  Exam reveals no gallop and no friction rub.   No murmur heard. Pulmonary/Chest: Breath sounds normal. No respiratory distress. He has no wheezes. He has no rales.    Abdominal: Soft. Bowel sounds are normal. He exhibits no mass. There is no hepatosplenomegaly. There is no tenderness. There is no rebound, no guarding and no CVA tenderness.  Musculoskeletal: Normal range of motion. He exhibits no edema or tenderness.  Lymphadenopathy:    He has no cervical adenopathy.  Neurological: He is alert and oriented to person, place, and time. He has normal sensation, normal strength and intact cranial nerves. No cranial nerve deficit.  Skin: Skin is warm. No rash noted.     scaling /lichenification  Psychiatric: Mood and affect normal.      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 Otilio Miu [facility]  06/03/2015

## 2015-07-12 DIAGNOSIS — L820 Inflamed seborrheic keratosis: Secondary | ICD-10-CM | POA: Diagnosis not present

## 2015-07-12 DIAGNOSIS — L281 Prurigo nodularis: Secondary | ICD-10-CM | POA: Diagnosis not present

## 2015-07-28 ENCOUNTER — Emergency Department (HOSPITAL_COMMUNITY)
Admission: EM | Admit: 2015-07-28 | Discharge: 2015-07-28 | Disposition: A | Discharge status: Home / Self Care | Payer: Commercial Managed Care - HMO | Attending: Emergency Medicine | Admitting: Emergency Medicine

## 2015-07-28 ENCOUNTER — Encounter (HOSPITAL_COMMUNITY): Payer: Self-pay | Admitting: Emergency Medicine

## 2015-07-28 ENCOUNTER — Emergency Department (HOSPITAL_COMMUNITY): Payer: Commercial Managed Care - HMO

## 2015-07-28 DIAGNOSIS — W270XXA Contact with workbench tool, initial encounter: Secondary | ICD-10-CM | POA: Diagnosis not present

## 2015-07-28 DIAGNOSIS — Z862 Personal history of diseases of the blood and blood-forming organs and certain disorders involving the immune mechanism: Secondary | ICD-10-CM | POA: Diagnosis not present

## 2015-07-28 DIAGNOSIS — Y9389 Activity, other specified: Secondary | ICD-10-CM | POA: Insufficient documentation

## 2015-07-28 DIAGNOSIS — Z872 Personal history of diseases of the skin and subcutaneous tissue: Secondary | ICD-10-CM | POA: Diagnosis not present

## 2015-07-28 DIAGNOSIS — K219 Gastro-esophageal reflux disease without esophagitis: Secondary | ICD-10-CM | POA: Diagnosis not present

## 2015-07-28 DIAGNOSIS — Y990 Civilian activity done for income or pay: Secondary | ICD-10-CM | POA: Diagnosis not present

## 2015-07-28 DIAGNOSIS — S61214A Laceration without foreign body of right ring finger without damage to nail, initial encounter: Secondary | ICD-10-CM | POA: Insufficient documentation

## 2015-07-28 DIAGNOSIS — S61210A Laceration without foreign body of right index finger without damage to nail, initial encounter: Secondary | ICD-10-CM | POA: Insufficient documentation

## 2015-07-28 DIAGNOSIS — S61212A Laceration without foreign body of right middle finger without damage to nail, initial encounter: Secondary | ICD-10-CM | POA: Insufficient documentation

## 2015-07-28 DIAGNOSIS — T1490XA Injury, unspecified, initial encounter: Secondary | ICD-10-CM

## 2015-07-28 DIAGNOSIS — Y9289 Other specified places as the place of occurrence of the external cause: Secondary | ICD-10-CM | POA: Insufficient documentation

## 2015-07-28 DIAGNOSIS — S61411A Laceration without foreign body of right hand, initial encounter: Secondary | ICD-10-CM

## 2015-07-28 MED ORDER — OXYCODONE HCL 5 MG PO TABS: 5.0000 mg | ORAL_TABLET | ORAL | Status: DC | PRN

## 2015-07-28 MED ORDER — OXYCODONE HCL 5 MG PO TABS: 5.0000 mg | ORAL_TABLET | Freq: Once | ORAL | Status: DC

## 2015-07-28 MED ORDER — HYDROCODONE-ACETAMINOPHEN 5-325 MG PO TABS
2.0000 | ORAL_TABLET | Freq: Once | ORAL | Status: AC
  Administered 2015-07-28: 2 via ORAL
  Filled 2015-07-28: qty 2

## 2015-07-28 MED ORDER — LIDOCAINE-EPINEPHRINE 1 %-1:100000 IJ SOLN
20.0000 mL | Freq: Once | INTRAMUSCULAR | Status: DC
  Filled 2015-07-28: qty 1

## 2015-07-28 MED ORDER — CEPHALEXIN 500 MG PO CAPS: 500.0000 mg | ORAL_CAPSULE | Freq: Four times a day (QID) | ORAL | Status: DC

## 2015-07-28 MED ORDER — ACETAMINOPHEN 500 MG PO TABS: 1000.0000 mg | ORAL_TABLET | Freq: Once | ORAL | Status: DC

## 2015-07-28 MED ORDER — IBUPROFEN 800 MG PO TABS
800.0000 mg | ORAL_TABLET | Freq: Once | ORAL | Status: AC
  Administered 2015-07-28: 800 mg via ORAL
  Filled 2015-07-28: qty 1

## 2015-07-28 NOTE — ED Notes (Signed)
Patient transported to X-ray 

## 2015-07-28 NOTE — Discharge Instructions (Signed)
Take 4 over the counter ibuprofen tablets 3 times a day or 2 over-the-counter naproxen tablets twice a day for pain.  Return for redness, drainage, fevers, nausea  Laceration Care, Adult A laceration is a cut that goes through all of the layers of the skin and into the tissue that is right under the skin. Some lacerations heal on their own. Others need to be closed with stitches (sutures), staples, skin adhesive strips, or skin glue. Proper laceration care minimizes the risk of infection and helps the laceration to heal better. HOW TO CARE FOR YOUR LACERATION If sutures or staples were used:  Keep the wound clean and dry.  If you were given a bandage (dressing), you should change it at least one time per day or as told by your health care provider. You should also change it if it becomes wet or dirty.  Keep the wound completely dry for the first 24 hours or as told by your health care provider. After that time, you may shower or bathe. However, make sure that the wound is not soaked in water until after the sutures or staples have been removed.  Clean the wound one time each day or as told by your health care provider:  Wash the wound with soap and water.  Rinse the wound with water to remove all soap.  Pat the wound dry with a clean towel. Do not rub the wound.  After cleaning the wound, apply a thin layer of antibiotic ointmentas told by your health care provider. This will help to prevent infection and keep the dressing from sticking to the wound.  Have the sutures or staples removed as told by your health care provider. If skin adhesive strips were used:  Keep the wound clean and dry.  If you were given a bandage (dressing), you should change it at least one time per day or as told by your health care provider. You should also change it if it becomes dirty or wet.  Do not get the skin adhesive strips wet. You may shower or bathe, but be careful to keep the wound dry.  If the  wound gets wet, pat it dry with a clean towel. Do not rub the wound.  Skin adhesive strips fall off on their own. You may trim the strips as the wound heals. Do not remove skin adhesive strips that are still stuck to the wound. They will fall off in time. If skin glue was used:  Try to keep the wound dry, but you may briefly wet it in the shower or bath. Do not soak the wound in water, such as by swimming.  After you have showered or bathed, gently pat the wound dry with a clean towel. Do not rub the wound.  Do not do any activities that will make you sweat heavily until the skin glue has fallen off on its own.  Do not apply liquid, cream, or ointment medicine to the wound while the skin glue is in place. Using those may loosen the film before the wound has healed.  If you were given a bandage (dressing), you should change it at least one time per day or as told by your health care provider. You should also change it if it becomes dirty or wet.  If a dressing is placed over the wound, be careful not to apply tape directly over the skin glue. Doing that may cause the glue to be pulled off before the wound has healed.  Do  not pick at the glue. The skin glue usually remains in place for 5-10 days, then it falls off of the skin. General Instructions  Take over-the-counter and prescription medicines only as told by your health care provider.  If you were prescribed an antibiotic medicine or ointment, take or apply it as told by your doctor. Do not stop using it even if your condition improves.  To help prevent scarring, make sure to cover your wound with sunscreen whenever you are outside after stitches are removed, after adhesive strips are removed, or when glue remains in place and the wound is healed. Make sure to wear a sunscreen of at least 30 SPF.  Do not scratch or pick at the wound.  Keep all follow-up visits as told by your health care provider. This is important.  Check your wound  every day for signs of infection. Watch for:  Redness, swelling, or pain.  Fluid, blood, or pus.  Raise (elevate) the injured area above the level of your heart while you are sitting or lying down, if possible. SEEK MEDICAL CARE IF:  You received a tetanus shot and you have swelling, severe pain, redness, or bleeding at the injection site.  You have a fever.  A wound that was closed breaks open.  You notice a bad smell coming from your wound or your dressing.  You notice something coming out of the wound, such as wood or glass.  Your pain is not controlled with medicine.  You have increased redness, swelling, or pain at the site of your wound.  You have fluid, blood, or pus coming from your wound.  You notice a change in the color of your skin near your wound.  You need to change the dressing frequently due to fluid, blood, or pus draining from the wound.  You develop a new rash.  You develop numbness around the wound. SEEK IMMEDIATE MEDICAL CARE IF:  You develop severe swelling around the wound.  Your pain suddenly increases and is severe.  You develop painful lumps near the wound or on skin that is anywhere on your body.  You have a red streak going away from your wound.  The wound is on your hand or foot and you cannot properly move a finger or toe.  The wound is on your hand or foot and you notice that your fingers or toes look pale or bluish.   This information is not intended to replace advice given to you by your health care provider. Make sure you discuss any questions you have with your health care provider.   Document Released: 07/24/2005 Document Revised: 12/08/2014 Document Reviewed: 07/20/2014 Elsevier Interactive Patient Education [employer].

## 2015-07-28 NOTE — ED Notes (Signed)
Lac to first two right digits from band saw, bleeding controlled, no other injuries, A/O X4, NAD

## 2015-07-28 NOTE — ED Provider Notes (Signed)
CSN: [id number]     Arrival date & time 07/28/15  [id number] History   First MD Initiated Contact with Patient 07/28/15 [phone]     Chief Complaint  Patient presents with  . Extremity Laceration     (Consider location/radiation/quality/duration/timing/severity/associated sxs/prior Treatment) HPI   Marco Sims is a 81 year old gentleman with a PMH of GERD who presents with an injury to his right hand after working with a table saw. The patient was fully alert and able to work with the tablesaw, but was accidentally drawn in by it while it was running, slicing through three of his fingers. He applied pressure with a towel and drove himself to the [facility] ED. He only reported minimal pain and some diminished sensation on the lateral aspect of his index finger. He's had over 3 tetanus toxoid vaccines over the course of his life and his last dose was two years ago.   Past Medical History  Diagnosis Date  . Lichen simplex chronicus   . Allergy     seaasonal  . Anemia   . GERD (gastroesophageal reflux disease)    Past Surgical History  Procedure Laterality Date  . Herniorpahy, rt  05/09/2009  . Tonsillectomy 1942    . Fracture surgery    . Hernia repair    . Fractured left arm radius and  ulna 1955    . Colles' fracture right 1956    . Ruptured left achilles tendon 1980    . Surgery for cubital tunnel syndrome and left arm 1999    . Fractured right tibiofibular 2009    . Hand surgery  06/02/11  . Colonoscopy  2013   Family History  Problem Relation Age of Onset  . Mental illness Mother   . Cancer Brother    Social History  Substance Use Topics  . Smoking status: Never Smoker   . Smokeless tobacco: Never Used  . Alcohol Use: 0.0 oz/week    0 Standard drinks or equivalent per week     Comment: rare; 1 to 2 beers a month    Review of Systems  Constitutional: Negative for fever.  HENT: Negative for congestion and sore throat.   Eyes: Negative for visual disturbance.  Respiratory:  Negative for cough and shortness of breath.   Cardiovascular: Negative for chest pain and leg swelling.  Gastrointestinal: Negative for nausea, diarrhea and constipation.  Genitourinary: Negative for dysuria.  Musculoskeletal: Negative for arthralgias.      Allergies  Review of patient's allergies indicates no known allergies.  Home Medications   Prior to Admission medications   Medication Sig Start Date End Date Taking? Authorizing Provider  omeprazole (PRILOSEC) 20 MG capsule 1 cap qd 04/26/12  Yes Lucretia Kern, DO  aspirin 81 MG tablet Take 81 mg by mouth daily. Reported on 07/28/2015    Historical Provider, MD   BP 173/74 mmHg  Pulse 72  Temp(Src) 97.9 F (36.6 C) (Oral)  Resp 18  Ht 5\' 10"  (1.778 m)  Wt 81.647 kg  BMI 25.83 kg/m2  SpO2 100% Physical Exam  Constitutional: He appears well-developed and well-nourished. No distress.  HENT:  Mouth/Throat: No oropharyngeal exudate.  Moist oropharynx. Mild tonsillar erythema.   Eyes: EOM are normal. Pupils are equal, round, and reactive to light. No scleral icterus.  Neck: Neck supple. No JVD present.  Cardiovascular: Normal rate, regular rhythm and normal heart sounds.   Pulmonary/Chest: Effort normal and breath sounds normal. No respiratory distress. He has no wheezes.  Abdominal: Soft. Bowel sounds are normal. He exhibits no distension. There is no tenderness.  Musculoskeletal: Normal range of motion. He exhibits no edema.  Lymphadenopathy:    He has no cervical adenopathy.  Neurological: He is alert. He has normal reflexes.  Diminished sensation on lateral aspect of index finger.  Skin:     Laceration across the palmar aspect of hand involving the index, middle, and ring fingers.  Vitals reviewed.   ED Course  .Nerve Block Date/Time: 07/28/2015 1:16 PM Performed by: Liberty Handy Authorized by: Deno Etienne Consent: Verbal consent obtained. Consent given by: patient Patient understanding: patient states  understanding of the procedure being performed Patient consent: the patient's understanding of the procedure matches consent given Procedure consent: procedure consent matches procedure scheduled Site marked: the operative site was marked Imaging studies: imaging studies available Patient identity confirmed: verbally with patient and hospital-assigned identification number Indications: pain relief Body area: upper extremity Nerve: digital Laterality: right Patient sedated: no Patient position: supine Needle gauge: 7 G Location technique: anatomical landmarks Local anesthetic: lidocaine 2% with epinephrine Outcome: pain improved Patient tolerance: Patient tolerated the procedure well with no immediate complications Comments: Injection in tendon sheath of 2nd, 3rd, and 4th digits of right hand.    LACERATION REPAIR Performed by: Liberty Handy Authorized by: Deno Etienne Consent: Verbal consent obtained. Risks and benefits: risks, benefits and alternatives were discussed Consent given by: patient Patient identity confirmed: provided demographic data Prepped and Draped in normal sterile fashion Wound explored  Laceration Location: 2nd, 3rd, and 4th digits of right hand  Laceration Length: 2nd digit (4 cm), 3rd digit (3 cm), and 4th digit (2 cm)  No Foreign Bodies seen or palpated  Anesthesia: local infiltration  Local anesthetic: lidocaine 2% with epinephrine  Anesthetic total: 15 ml  Irrigation method: syringe Amount of cleaning: standard  Number of sutures: 12   Patient tolerance: Patient tolerated the procedure well with no immediate complications. (including critical care time)  Labs Review Labs Reviewed - No data to display  Imaging Review No results found. I have personally reviewed and evaluated these images and lab results as part of my medical decision-making.   EKG Interpretation None      MDM   Final diagnoses:  None   This is his second incident  with a table saw and we discussed strategies for 5 minutes on how to prevent similar episodes the future. There appears to be no antecedents to the incident. We consulted Dr. Caralyn Guile, hand surgeon, who recommended follow-up in his outpatient clinic and a course of antibiotics. A right hand X-ray was obtained which demonstrated small bony fragements. We cleaned the wound, provided digital nerve block, and sutured his wounds as described above. We instructed him to call Dr. Angus Palms office today and schedule an appoint and prescribed Keflex 500 mg qid for a 10-day course. He was discharged in good condition.    Liberty Handy, MD 07/28/15 [place], DO 07/28/15 1336

## 2015-08-04 DIAGNOSIS — S64490A Injury of digital nerve of right index finger, initial encounter: Secondary | ICD-10-CM | POA: Diagnosis not present

## 2015-08-04 DIAGNOSIS — M19231 Secondary osteoarthritis, right wrist: Secondary | ICD-10-CM | POA: Diagnosis not present

## 2015-08-04 DIAGNOSIS — S65500A Unspecified injury of blood vessel of right index finger, initial encounter: Secondary | ICD-10-CM | POA: Diagnosis not present

## 2015-08-04 DIAGNOSIS — S62634B Displaced fracture of distal phalanx of right ring finger, initial encounter for open fracture: Secondary | ICD-10-CM | POA: Diagnosis not present

## 2015-08-04 DIAGNOSIS — S62652A Nondisplaced fracture of medial phalanx of right middle finger, initial encounter for closed fracture: Secondary | ICD-10-CM | POA: Diagnosis not present

## 2015-08-04 DIAGNOSIS — R52 Pain, unspecified: Secondary | ICD-10-CM | POA: Diagnosis not present

## 2015-08-04 DIAGNOSIS — S61202A Unspecified open wound of right middle finger without damage to nail, initial encounter: Secondary | ICD-10-CM | POA: Diagnosis not present

## 2015-08-06 ENCOUNTER — Other Ambulatory Visit: Payer: Self-pay | Admitting: Orthopedic Surgery

## 2015-08-10 ENCOUNTER — Encounter (HOSPITAL_BASED_OUTPATIENT_CLINIC_OR_DEPARTMENT_OTHER): Payer: Self-pay | Admitting: *Deleted

## 2015-08-12 ENCOUNTER — Encounter (HOSPITAL_BASED_OUTPATIENT_CLINIC_OR_DEPARTMENT_OTHER): Payer: Self-pay | Admitting: Anesthesiology

## 2015-08-12 ENCOUNTER — Ambulatory Visit (HOSPITAL_BASED_OUTPATIENT_CLINIC_OR_DEPARTMENT_OTHER)
Admission: RE | Admit: 2015-08-12 | Discharge: 2015-08-12 | Disposition: A | Discharge status: Home / Self Care | Payer: Commercial Managed Care - HMO | Source: Ambulatory Visit | Attending: Orthopedic Surgery | Admitting: Orthopedic Surgery

## 2015-08-12 ENCOUNTER — Ambulatory Visit (HOSPITAL_BASED_OUTPATIENT_CLINIC_OR_DEPARTMENT_OTHER): Payer: Commercial Managed Care - HMO | Admitting: Anesthesiology

## 2015-08-12 ENCOUNTER — Encounter (HOSPITAL_BASED_OUTPATIENT_CLINIC_OR_DEPARTMENT_OTHER)
Admission: RE | Disposition: A | Discharge status: Home / Self Care | Payer: Self-pay | Source: Ambulatory Visit | Attending: Orthopedic Surgery

## 2015-08-12 DIAGNOSIS — S66120A Laceration of flexor muscle, fascia and tendon of right index finger at wrist and hand level, initial encounter: Secondary | ICD-10-CM | POA: Diagnosis not present

## 2015-08-12 DIAGNOSIS — S62620B Displaced fracture of medial phalanx of right index finger, initial encounter for open fracture: Secondary | ICD-10-CM | POA: Insufficient documentation

## 2015-08-12 DIAGNOSIS — Z7982 Long term (current) use of aspirin: Secondary | ICD-10-CM | POA: Diagnosis not present

## 2015-08-12 DIAGNOSIS — S62650A Nondisplaced fracture of medial phalanx of right index finger, initial encounter for closed fracture: Secondary | ICD-10-CM | POA: Diagnosis not present

## 2015-08-12 DIAGNOSIS — W298XXA Contact with other powered powered hand tools and household machinery, initial encounter: Secondary | ICD-10-CM | POA: Diagnosis not present

## 2015-08-12 DIAGNOSIS — S64490A Injury of digital nerve of right index finger, initial encounter: Secondary | ICD-10-CM | POA: Insufficient documentation

## 2015-08-12 DIAGNOSIS — S6991XA Unspecified injury of right wrist, hand and finger(s), initial encounter: Secondary | ICD-10-CM | POA: Diagnosis present

## 2015-08-12 DIAGNOSIS — S65510A Laceration of blood vessel of right index finger, initial encounter: Secondary | ICD-10-CM | POA: Diagnosis not present

## 2015-08-12 DIAGNOSIS — K219 Gastro-esophageal reflux disease without esophagitis: Secondary | ICD-10-CM | POA: Insufficient documentation

## 2015-08-12 DIAGNOSIS — S6981XA Other specified injuries of right wrist, hand and finger(s), initial encounter: Secondary | ICD-10-CM | POA: Diagnosis not present

## 2015-08-12 DIAGNOSIS — S66320A Laceration of extensor muscle, fascia and tendon of right index finger at wrist and hand level, initial encounter: Secondary | ICD-10-CM | POA: Insufficient documentation

## 2015-08-12 HISTORY — PX: NERVE REPAIR: SHX2083

## 2015-08-12 SURGERY — REPAIR, NERVE
Anesthesia: Regional | Site: Hand | Laterality: Right

## 2015-08-12 MED ORDER — CEFAZOLIN SODIUM-DEXTROSE 2-3 GM-% IV SOLR
2.0000 g | INTRAVENOUS | Status: AC
  Administered 2015-08-12: 2 g via INTRAVENOUS

## 2015-08-12 MED ORDER — CEPHALEXIN 250 MG PO CAPS: 250.0000 mg | ORAL_CAPSULE | Freq: Four times a day (QID) | ORAL | Status: DC

## 2015-08-12 MED ORDER — HEPARIN SODIUM (PORCINE) 1000 UNIT/ML IJ SOLN
INTRAMUSCULAR | Status: AC
  Filled 2015-08-12: qty 1

## 2015-08-12 MED ORDER — BUPIVACAINE-EPINEPHRINE (PF) 0.5% -1:200000 IJ SOLN
INTRAMUSCULAR | Status: DC | PRN
  Administered 2015-08-12: 30 mL via PERINEURAL

## 2015-08-12 MED ORDER — FENTANYL CITRATE (PF) 100 MCG/2ML IJ SOLN
50.0000 ug | INTRAMUSCULAR | Status: DC | PRN
  Administered 2015-08-12: 100 ug via INTRAVENOUS

## 2015-08-12 MED ORDER — PROPOFOL 10 MG/ML IV BOLUS
INTRAVENOUS | Status: AC
  Filled 2015-08-12: qty 40

## 2015-08-12 MED ORDER — CHLORHEXIDINE GLUCONATE 4 % EX LIQD: 60.0000 mL | Freq: Once | CUTANEOUS | Status: DC

## 2015-08-12 MED ORDER — MIDAZOLAM HCL 2 MG/2ML IJ SOLN
INTRAMUSCULAR | Status: AC
  Filled 2015-08-12: qty 2

## 2015-08-12 MED ORDER — DEXAMETHASONE SODIUM PHOSPHATE 4 MG/ML IJ SOLN
INTRAMUSCULAR | Status: DC | PRN
  Administered 2015-08-12: 10 mg via INTRAVENOUS

## 2015-08-12 MED ORDER — FENTANYL CITRATE (PF) 100 MCG/2ML IJ SOLN
INTRAMUSCULAR | Status: AC
  Filled 2015-08-12: qty 2

## 2015-08-12 MED ORDER — PROPOFOL 10 MG/ML IV BOLUS
INTRAVENOUS | Status: DC | PRN
  Administered 2015-08-12 (×6): 30 mg via INTRAVENOUS
  Administered 2015-08-12: 20 mg via INTRAVENOUS
  Administered 2015-08-12 (×10): 30 mg via INTRAVENOUS

## 2015-08-12 MED ORDER — CEFAZOLIN SODIUM-DEXTROSE 2-3 GM-% IV SOLR: 2.0000 g | INTRAVENOUS | Status: DC

## 2015-08-12 MED ORDER — SCOPOLAMINE 1 MG/3DAYS TD PT72: 1.0000 | MEDICATED_PATCH | Freq: Once | TRANSDERMAL | Status: DC | PRN

## 2015-08-12 MED ORDER — DEXAMETHASONE SODIUM PHOSPHATE 10 MG/ML IJ SOLN
INTRAMUSCULAR | Status: AC
  Filled 2015-08-12: qty 1

## 2015-08-12 MED ORDER — LIDOCAINE HCL (CARDIAC) 20 MG/ML IV SOLN
INTRAVENOUS | Status: AC
  Filled 2015-08-12: qty 5

## 2015-08-12 MED ORDER — MEPERIDINE HCL 25 MG/ML IJ SOLN: 6.2500 mg | INTRAMUSCULAR | Status: DC | PRN

## 2015-08-12 MED ORDER — LACTATED RINGERS IV SOLN
INTRAVENOUS | Status: DC
  Administered 2015-08-12: 08:00:00 via INTRAVENOUS

## 2015-08-12 MED ORDER — ONDANSETRON HCL 4 MG/2ML IJ SOLN
INTRAMUSCULAR | Status: AC
  Filled 2015-08-12: qty 2

## 2015-08-12 MED ORDER — LIDOCAINE HCL (CARDIAC) 20 MG/ML IV SOLN
INTRAVENOUS | Status: DC | PRN
  Administered 2015-08-12 (×4): 30 mg via INTRAVENOUS

## 2015-08-12 MED ORDER — LACTATED RINGERS IV SOLN
INTRAVENOUS | Status: DC | PRN
  Administered 2015-08-12 (×2): via INTRAVENOUS

## 2015-08-12 MED ORDER — MIDAZOLAM HCL 2 MG/2ML IJ SOLN: 1.0000 mg | INTRAMUSCULAR | Status: DC | PRN

## 2015-08-12 MED ORDER — HYDROCODONE-ACETAMINOPHEN 5-325 MG PO TABS: 1.0000 | ORAL_TABLET | Freq: Four times a day (QID) | ORAL | Status: DC | PRN

## 2015-08-12 MED ORDER — HYDROMORPHONE HCL 1 MG/ML IJ SOLN: 0.2500 mg | INTRAMUSCULAR | Status: DC | PRN

## 2015-08-12 MED ORDER — PHENYLEPHRINE HCL 10 MG/ML IJ SOLN
INTRAMUSCULAR | Status: DC | PRN
  Administered 2015-08-12: 40 ug via INTRAVENOUS

## 2015-08-12 MED ORDER — GLYCOPYRROLATE 0.2 MG/ML IJ SOLN: 0.2000 mg | Freq: Once | INTRAMUSCULAR | Status: DC | PRN

## 2015-08-12 MED ORDER — LIDOCAINE HCL (PF) 1 % IJ SOLN
INTRAMUSCULAR | Status: AC
  Filled 2015-08-12: qty 30

## 2015-08-12 MED ORDER — FENTANYL CITRATE (PF) 100 MCG/2ML IJ SOLN
INTRAMUSCULAR | Status: DC | PRN
  Administered 2015-08-12: 25 ug via INTRAVENOUS

## 2015-08-12 MED ORDER — PROMETHAZINE HCL 25 MG/ML IJ SOLN: 6.2500 mg | INTRAMUSCULAR | Status: DC | PRN

## 2015-08-12 MED ORDER — BUPIVACAINE HCL (PF) 0.25 % IJ SOLN
INTRAMUSCULAR | Status: AC
  Filled 2015-08-12: qty 90

## 2015-08-12 MED ORDER — CEFAZOLIN SODIUM-DEXTROSE 2-3 GM-% IV SOLR
INTRAVENOUS | Status: AC
  Filled 2015-08-12: qty 50

## 2015-08-12 SURGICAL SUPPLY — 62 items
BAG DECANTER FOR FLEXI CONT (MISCELLANEOUS) IMPLANT
BLADE MINI RND TIP GREEN BEAV (BLADE) ×2 IMPLANT
BLADE SURG 15 STRL LF DISP TIS (BLADE) ×2 IMPLANT
BLADE SURG 15 STRL SS (BLADE) ×2
BNDG COHESIVE 1X5 TAN STRL LF (GAUZE/BANDAGES/DRESSINGS) ×4 IMPLANT
BNDG COHESIVE 3X5 TAN STRL LF (GAUZE/BANDAGES/DRESSINGS) ×2 IMPLANT
BNDG ESMARK 4X9 LF (GAUZE/BANDAGES/DRESSINGS) IMPLANT
BNDG GAUZE ELAST 4 BULKY (GAUZE/BANDAGES/DRESSINGS) ×2 IMPLANT
CHLORAPREP W/TINT 26ML (MISCELLANEOUS) ×2 IMPLANT
CORDS BIPOLAR (ELECTRODE) ×2 IMPLANT
COVER BACK TABLE 60X90IN (DRAPES) ×2 IMPLANT
COVER MAYO STAND STRL (DRAPES) ×2 IMPLANT
CUFF TOURNIQUET SINGLE 18IN (TOURNIQUET CUFF) ×2 IMPLANT
DECANTER SPIKE VIAL GLASS SM (MISCELLANEOUS) IMPLANT
DRAPE EXTREMITY T 121X128X90 (DRAPE) ×2 IMPLANT
DRAPE SURG 17X23 STRL (DRAPES) ×2 IMPLANT
GAUZE SPONGE 4X4 12PLY STRL (GAUZE/BANDAGES/DRESSINGS) ×2 IMPLANT
GAUZE XEROFORM 1X8 LF (GAUZE/BANDAGES/DRESSINGS) ×2 IMPLANT
GLOVE BIO SURGEON STRL SZ7.5 (GLOVE) ×2 IMPLANT
GLOVE BIOGEL PI IND STRL 7.0 (GLOVE) ×1 IMPLANT
GLOVE BIOGEL PI IND STRL 8 (GLOVE) ×1 IMPLANT
GLOVE BIOGEL PI IND STRL 8.5 (GLOVE) ×1 IMPLANT
GLOVE BIOGEL PI IND STRL 9 (GLOVE) ×1 IMPLANT
GLOVE BIOGEL PI INDICATOR 7.0 (GLOVE) ×1
GLOVE BIOGEL PI INDICATOR 8 (GLOVE) ×1
GLOVE BIOGEL PI INDICATOR 8.5 (GLOVE) ×1
GLOVE BIOGEL PI INDICATOR 9 (GLOVE) ×1
GLOVE ECLIPSE 6.5 STRL STRAW (GLOVE) ×2 IMPLANT
GLOVE SURG ORTHO 8.0 STRL STRW (GLOVE) ×2 IMPLANT
GOWN STRL REUS W/ TWL LRG LVL3 (GOWN DISPOSABLE) ×1 IMPLANT
GOWN STRL REUS W/TWL LRG LVL3 (GOWN DISPOSABLE) ×1
GOWN STRL REUS W/TWL XL LVL3 (GOWN DISPOSABLE) ×4 IMPLANT
LOOP VESSEL MAXI BLUE (MISCELLANEOUS) IMPLANT
NDL SAFETY ECLIPSE 18X1.5 (NEEDLE) IMPLANT
NEEDLE BLD DRAW 23GX1   MC LAB (NEEDLE) ×1
NEEDLE BLD DRAW 23GX1  MC LAB (NEEDLE) ×1 IMPLANT
NEEDLE HYPO 18GX1.5 SHARP (NEEDLE)
NEEDLE PRECISIONGLIDE 27X1.5 (NEEDLE) IMPLANT
NS IRRIG 1000ML POUR BTL (IV SOLUTION) ×2 IMPLANT
PACK BASIN DAY SURGERY FS (CUSTOM PROCEDURE TRAY) ×2 IMPLANT
PAD CAST 3X4 CTTN HI CHSV (CAST SUPPLIES) ×1 IMPLANT
PAD CAST 4YDX4 CTTN HI CHSV (CAST SUPPLIES) IMPLANT
PADDING CAST ABS 4INX4YD NS (CAST SUPPLIES)
PADDING CAST ABS COTTON 4X4 ST (CAST SUPPLIES) IMPLANT
PADDING CAST COTTON 3X4 STRL (CAST SUPPLIES) ×1
PADDING CAST COTTON 4X4 STRL (CAST SUPPLIES)
SLEEVE SCD COMPRESS KNEE MED (MISCELLANEOUS) ×2 IMPLANT
SPEAR EYE SURG WECK-CEL (MISCELLANEOUS) ×2 IMPLANT
SPLINT PLASTER CAST XFAST 3X15 (CAST SUPPLIES) IMPLANT
SPLINT PLASTER XTRA FASTSET 3X (CAST SUPPLIES)
STOCKINETTE 4X48 STRL (DRAPES) ×2 IMPLANT
SUT ETHIBOND 3-0 V-5 (SUTURE) IMPLANT
SUT ETHILON 4 0 PS 2 18 (SUTURE) ×4 IMPLANT
SUT MERSILENE 6 0 P 1 (SUTURE) IMPLANT
SUT NYLON 9 0 VRM6 (SUTURE) ×2 IMPLANT
SUT SILK 4 0 PS 2 (SUTURE) ×2 IMPLANT
SUT SUPRAMID 3-0 (SUTURE) IMPLANT
SUT VICRYL 4-0 PS2 18IN ABS (SUTURE) IMPLANT
SYR BULB 3OZ (MISCELLANEOUS) ×2 IMPLANT
SYR CONTROL 10ML LL (SYRINGE) IMPLANT
TOWEL OR 17X24 6PK STRL BLUE (TOWEL DISPOSABLE) ×4 IMPLANT
UNDERPAD 30X30 (UNDERPADS AND DIAPERS) ×2 IMPLANT

## 2015-08-12 NOTE — H&P (Signed)
Marco Sims is an 82 y.o. male.   Chief Complaint: Marco Sims is a 82 year old right-hand dominant male who suffered an injury one week ago on 12/21 while using his table saw. He sustained an injury to his right index, middle and ring fingers, was seen at [facility] Emergency Room where these were closed and irrigated. He was placed on oxycodone and Keflex with the injury to the middle phalanx of his index, middle, and distal of his middle and distal of his ring finger. He has a history of arthritis. 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 He returns now for continued care of this injury.  Past Medical History  Diagnosis Date  . GERD (gastroesophageal reflux disease)   Past Surgical History  Procedure Laterality Date  . Cubital tunnel surgery  . Hernia repair  . Heel spur surgery  . Tonillectomy  . Forearm fracture surgery  . Tibia fracture surgery   No Known Allergies  ROS HENT: Positive (comments: hearing loss)   Eyes: Positive (comments: glasses. cateracts)    HPI:see above  Past Medical History  Diagnosis Date  . Lichen simplex chronicus   . Allergy     seaasonal  . Anemia   . GERD (gastroesophageal reflux disease)     Past Surgical History  Procedure Laterality Date  . Herniorpahy, rt  05/09/2009  . Tonsillectomy 1942    . Fracture surgery    . Hernia repair    . Fractured left arm radius and  ulna 1955    . Colles' fracture right 1956    . Ruptured left achilles tendon 1980    . Surgery for cubital tunnel syndrome and left arm 1999    . Fractured right tibiofibular 2009    . Hand surgery  06/02/11  . Colonoscopy  2013    Family History  Problem Relation Age of Onset  . Mental illness Mother   . Cancer Brother    Social History:  reports that he has never smoked. He has never used smokeless tobacco. He reports that he drinks alcohol. He reports that he does not use illicit drugs.  Allergies: No Known  Allergies  Medications Prior to Admission  Medication Sig Dispense Refill  . cephALEXin (KEFLEX) 500 MG capsule Take 1 capsule (500 mg total) by mouth 4 (four) times daily. 40 capsule 0  . Multiple Vitamin (MULTIVITAMIN WITH MINERALS) TABS tablet Take 1 tablet by mouth daily.    Marland Kitchen omeprazole (PRILOSEC) 20 MG capsule 1 cap qd 90 capsule 3  . aspirin 81 MG tablet Take 81 mg by mouth daily. Reported on 07/28/2015    . oxyCODONE (ROXICODONE) 5 MG immediate release tablet Take 1 tablet (5 mg total) by mouth every 4 (four) hours as needed for severe pain. 10 tablet 0    No results found for this or any previous visit (from the past 48 hour(s)).  No results found.   Pertinent items are noted in HPI.  Blood pressure 177/60, pulse 52, temperature 97.6 F (36.4 C), temperature source Oral, resp. rate 16, height 5\' 10"  (1.778 m), weight 80.91 kg (178 lb 6 oz), SpO2 97 %.  General appearance: alert, cooperative and appears stated age Head: Normocephalic, without obvious abnormality Neck: no JVD Resp: clear to auscultation bilaterally Cardio: regular rate and rhythm, S1, S2 normal, no murmur, click, rub or gallop GI: soft, non-tender; bowel sounds normal; no masses,  no organomegaly Extremities: lacerations right index and middle fingers Pulses: 2+  and symmetric Skin: Skin color, texture, turgor normal. No rashes or lesions Neurologic: Grossly normal Incision/Wound: sututred  Assessment/Plan X-rays of his right hand reveal a bony defect in the proximal aspect of the index finger middle phalanx. He has a fracture of the distal phalanx of his ring finger. It is difficult to determine if there is any bony injury to his middle finger. 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 This is  from an old fracture years ago. DIAGNOSIS: Tablesaw injury, index, middle, and ring fingers with injury to the radial digital artery and nerve possible flexor tendon to the index finger, fracture of the distal phalanx of his ring finger and lacerations of the middle finger right hand. Plan:Physical examination reveals that he has the injuries to the index, middle and ring fingers. I would not recommend doing anything to his arthritis. We need to explore the digital artery nerve, flexor tendon on his index finger. We will explore the middle finger. I do not feel we need to explore the ring finger. He is aware that there was an injury to the radial digital nerve and artery. We will plan on exploration of the index and middle fingers with Axogen nerve tube repair in an effort to minimize neuroma formation on his index finger. Hopefully, regain some sensibility to the tip. We will have to make a decision about repairing radial artery and flexor tendon should these be involved. This will be scheduled as an outpatient under regional anesthesia. He is aware that there is no guarantee with the surgery, possibility of infection, recurrence, injury to arteries, nerves, tendons and symptoms of dystrophy. He we have told him that if he comes in at this appears infected that we will simply debride and drain the area, allow this to heal and then repeat surgery as previously dictated.    Janiya Millirons R 08/12/2015, 8:21 AM

## 2015-08-12 NOTE — Anesthesia Postprocedure Evaluation (Signed)
Anesthesia Post Note  Patient: Marco Sims  Procedure(s) Performed: Procedure(s) (LRB): EXPLORATION REPAIR  NERVE, DEBRIDEMENT FRACTURE RIGHT INDEX MIDDLE FINGER (Right)  Patient location during evaluation: PACU Anesthesia Type: MAC and Regional Level of consciousness: awake and alert Pain management: pain level controlled Vital Signs Assessment: post-procedure vital signs reviewed and stable Respiratory status: spontaneous breathing Cardiovascular status: stable Anesthetic complications: no    Last Vitals:  Filed Vitals:   08/12/15 1015 08/12/15 1016  BP: 126/64   Pulse: 57 56  Temp:  36.4 C  Resp:  10    Last Pain:  Filed Vitals:   08/12/15 1027  PainSc: 0-No pain                 Nolon Nations

## 2015-08-12 NOTE — Op Note (Signed)
NAME:  Marco Sims, Marco Sims                      ACCOUNT NO.:  MEDICAL RECORD NO.:  [id number]  LOCATION:                                 FACILITY:  PHYSICIAN:  Daryll Brod, M.D.       DATE OF BIRTH:  October 09, 1933  DATE OF PROCEDURE:  08/12/2015 DATE OF DISCHARGE:                              OPERATIVE REPORT   PREOPERATIVE DIAGNOSIS:  Table saw injury, right index and middle fingers.  POSTOPERATIVE DIAGNOSIS:  Table saw injury, right index and middle fingers.  OPERATION:  Exploration of index and middle fingers, arteries, nerves with repair of digital nerve, index finger.  Debridement of open fracture, index finger, middle phalanx.  Exploration of arteries, flexor tendons, both fingers.  SURGEON:  Daryll Brod, M.D.  ASSISTANT:  Leanora Cover, MD  ANESTHESIA:  Supraclavicular block with sedation.  ANESTHESIOLOGIST:  Cleon Dew. Kalman Shan, M.D.  HISTORY:  The patient is a 82 year old male who suffered a saw injury to his index, middle and ring fingers of his right hand approximately 2 weeks ago.  He underwent repair in the emergency room with closure.  X- rays revealed a fracture of his middle phalanx.  He is complaining of loss of sensation on the radial aspect of his index finger, some discomfort with flexion, resisted with altered sensation of his middle finger with the wounds over the middle phalanges of each.  He was admitted for exploration, possible repairs dictated by findings with debridement of the wound and joint.  He is aware that there was no guarantee with the surgery; possibility of infection; recurrence of injury to arteries, nerves, tendons; incomplete relief of symptoms and dystrophy that he will probably not regain normal sensation to the tip of his index finger despite repair.  In the preoperative area, the patient was seen, the extremity marked by both patient and surgeon, and antibiotic given.  Regional block was undertaken by Dr. Kalman Shan.  DESCRIPTION OF PROCEDURE:  He  was brought to the operating room where sedation was performed.  He was placed in supine position, prepped using Betadine scrub and solution after removal of his sutures.  Time-out taken confirming the patient and procedure.  The limb was exsanguinated with an Esmarch bandage.  Tourniquet placed high on the arm was inflated to 250 mmHg.  The wounds were then opened.  He had some maceration of the skin, subcutaneous tissue on the index finger in one portion of the old wound.  Cultures were taken for both aerobic and anaerobic cultures. This was thoroughly debrided with sharp dissection using scissors and scalpel.  The incisions were extended proximally and distally in a zigzag manner.  This involved taking the wound incision to the dorsal aspect of the middle phalanx on the index finger.  A partial laceration to the extensor tendon was debrided sharply.  The neurovascular bundle was found to be lacerated.  The fracture was identified.  The flexor sheath was opened and the flexor tendon was found to be intact.  The fracture was then thoroughly debrided with curettes.  The wound was irrigated.  The middle finger was attended to next.  The incision was made through the  old laceration, carried down through the subcutaneous tissue, neurovascular bundles on the radial aspect.  Both artery and nerve were found to be intact.  There was no violation of the flexor tendon sheath.  This wound was irrigated and closed with interrupted 4-0 nylon sutures.  The operative microscope was then brought into position for inspection of the index finger.  The digital artery was found to be extremely small, it was found proximally, but could not be identified distally.  The digital nerve was fully lacerated.  This was then repaired with interrupted 9-0 nylon sutures and fascicular repair.  The wound was again irrigated.  The skin was then closed with interrupted 4- 0 nylon sutures.  Sterile compressive dressing  and splint to the index finger was applied.  A sterile compressive dressing to the middle finger was applied.  On deflation of the tourniquet, remaining fingers were pinked.  He was taken to the recovery room for observation in satisfactory condition.  He will be discharged to home to return to the [facility] in 1 week, on Vicodin and Keflex.          ______________________________ Daryll Brod, M.D.     GK/MEDQ  D:  08/12/2015  T:  08/12/2015  Job:  [id number]

## 2015-08-12 NOTE — Discharge Instructions (Addendum)

## 2015-08-12 NOTE — Anesthesia Procedure Notes (Addendum)
Anesthesia Regional Block:  Axillary brachial plexus block  Pre-Anesthetic Checklist: ,, timeout performed, Correct Patient, Correct Site, Correct Laterality, Correct Procedure, Correct Position, site marked, Risks and benefits discussed, Surgical consent,  Pre-op evaluation,  Post-op pain management  Laterality: Right  Prep: chloraprep       Needles:  Injection technique: Single-shot  Needle Type: Stimulator Needle - 40     Needle Length: 4cm 4 cm Needle Gauge: 22 and 22 G    Additional Needles:  Procedures: ultrasound guided (picture in chart) Axillary brachial plexus block Narrative:  Start time: 08/12/2015 8:20 AM End time: 08/12/2015 8:27 AM Injection made incrementally with aspirations every 5 mL.  Performed by: Personally  Anesthesiologist: Nolon Nations  Additional Notes: BP cuff, EKG monitors applied. Sedation begun. Nerve location verified with U/S. Anesthetic injected incrementally, slowly , and after neg aspirations under direct u/s guidance. Good perineural spread. Tolerated well.   Procedure Name: MAC Date/Time: 08/12/2015 8:42 AM Performed by: Marrianne Mood Pre-anesthesia Checklist: Patient identified, Timeout performed, Emergency Drugs available, Suction available and Patient being monitored Patient Re-evaluated:Patient Re-evaluated prior to inductionOxygen Delivery Method: Simple face mask

## 2015-08-12 NOTE — Anesthesia Preprocedure Evaluation (Signed)
Anesthesia Evaluation  Patient identified by MRN, date of birth, ID band Patient awake    Reviewed: Allergy & Precautions, NPO status , Patient's Chart, lab work & pertinent test results  Airway Mallampati: II  TM Distance: >3 FB Neck ROM: Full    Dental no notable dental hx.    Pulmonary neg pulmonary ROS,    Pulmonary exam normal breath sounds clear to auscultation       Cardiovascular negative cardio ROS Normal cardiovascular exam Rhythm:Regular Rate:Normal     Neuro/Psych negative neurological ROS  negative psych ROS   GI/Hepatic Neg liver ROS, GERD  ,  Endo/Other  negative endocrine ROSHypothyroidism   Renal/GU negative Renal ROS     Musculoskeletal negative musculoskeletal ROS (+)   Abdominal   Peds  Hematology  (+) anemia ,   Anesthesia Other Findings   Reproductive/Obstetrics                             Anesthesia Physical Anesthesia Plan  ASA: II  Anesthesia Plan: Regional   Post-op Pain Management:    Induction: Intravenous  Airway Management Planned:   Additional Equipment:   Intra-op Plan:   Post-operative Plan:   Informed Consent: I have reviewed the patients History and Physical, chart, labs and discussed the procedure including the risks, benefits and alternatives for the proposed anesthesia with the patient or authorized representative who has indicated his/her understanding and acceptance.   Dental advisory given  Plan Discussed with: CRNA  Anesthesia Plan Comments:         Anesthesia Quick Evaluation

## 2015-08-12 NOTE — Transfer of Care (Signed)
Immediate Anesthesia Transfer of Care Note  Patient: Marco Sims  Procedure(s) Performed: Procedure(s): EXPLORATION REPAIR  NERVE, DEBRIDEMENT FRACTURE RIGHT INDEX MIDDLE FINGER (Right)  Patient Location: PACU  Anesthesia Type:MAC  Level of Consciousness: awake and patient cooperative  Airway & Oxygen Therapy: Patient Spontanous Breathing and Patient connected to face mask oxygen  Post-op Assessment: Report given to RN and Post -op Vital signs reviewed and stable  Post vital signs: Reviewed and stable  Last Vitals:  Filed Vitals:   08/12/15 0826 08/12/15 0827  BP:    Pulse: 59 63  Temp:    Resp: 12 14    Complications: No apparent anesthesia complications

## 2015-08-12 NOTE — Brief Op Note (Signed)
08/12/2015  10:23 AM  PATIENT:  Marco Sims  82 y.o. male  PRE-OPERATIVE DIAGNOSIS:  SAW INJURY INDEX MIDDLE  RIGHT HAND  POST-OPERATIVE DIAGNOSIS:  SAW INJURY INDEX AND MIDDLE FINGER RIGHT HAND  PROCEDURE:  Procedure(s): EXPLORATION REPAIR  NERVE, DEBRIDEMENT FRACTURE RIGHT INDEX MIDDLE FINGER (Right)  SURGEON:  Surgeon(s) and Role:    * Daryll Brod, MD - Primary  PHYSICIAN ASSISTANT:   ASSISTANTS: Richardo Priest, MD   ANESTHESIA:   regional and IV sedation  EBL:  Total I/O In: 1500 [I.V.:1500] Out: -   BLOOD ADMINISTERED:none  DRAINS: none   LOCAL MEDICATIONS USED:  NONE  SPECIMEN:  No Specimen  DISPOSITION OF SPECIMEN:  N/A  COUNTS:  YES  TOURNIQUET:   Total Tourniquet Time Documented: Upper Arm (Right) - 67 minutes Total: Upper Arm (Right) - 67 minutes   DICTATION: .Other Dictation: Dictation Number [id number]  PLAN OF CARE: Discharge to home after PACU  PATIENT DISPOSITION:  PACU - hemodynamically stable.

## 2015-08-12 NOTE — Progress Notes (Signed)
Assisted Dr. Germeroth with right, ultrasound guided, axillary block. Side rails up, monitors on throughout procedure. See vital signs in flow sheet. Tolerated Procedure well. 

## 2015-08-12 NOTE — Op Note (Signed)
Dictation Number [id number]

## 2015-08-13 ENCOUNTER — Encounter (HOSPITAL_BASED_OUTPATIENT_CLINIC_OR_DEPARTMENT_OTHER): Payer: Self-pay | Admitting: Orthopedic Surgery

## 2015-08-13 NOTE — Op Note (Signed)
I assisted Surgeon(s) and Role:    * Daryll Brod, MD - Primary on the Procedure(s): Man, DEBRIDEMENT FRACTURE RIGHT INDEX MIDDLE FINGER on 08/12/2015.  I provided assistance on this case as follows: exploration of neve and vessel under the microscope and assistance with repair of nerve under microscope.  Electronically signed by: Tennis Must, MD Date: 08/13/2015 Time: 2:01 PM

## 2015-08-17 LAB — WOUND CULTURE

## 2015-08-18 LAB — ANAEROBIC CULTURE

## 2015-08-20 DIAGNOSIS — S64490A Injury of digital nerve of right index finger, initial encounter: Secondary | ICD-10-CM | POA: Diagnosis not present

## 2015-08-20 DIAGNOSIS — S62652A Nondisplaced fracture of medial phalanx of right middle finger, initial encounter for closed fracture: Secondary | ICD-10-CM | POA: Diagnosis not present

## 2015-09-01 DIAGNOSIS — I10 Essential (primary) hypertension: Secondary | ICD-10-CM | POA: Diagnosis not present

## 2015-09-01 NOTE — Op Note (Signed)
Addendum Pre op diagnosis: open injury right index and middle fingers Post op diagnosis: open lacerations index and middle fingers right hand

## 2015-09-09 LAB — FUNGUS CULTURE W SMEAR: Fungal Smear: NONE SEEN

## 2015-09-21 DIAGNOSIS — S62652D Nondisplaced fracture of medial phalanx of right middle finger, subsequent encounter for fracture with routine healing: Secondary | ICD-10-CM | POA: Diagnosis not present

## 2015-09-21 DIAGNOSIS — S64490D Injury of digital nerve of right index finger, subsequent encounter: Secondary | ICD-10-CM | POA: Diagnosis not present

## 2015-09-21 DIAGNOSIS — S62634D Displaced fracture of distal phalanx of right ring finger, subsequent encounter for fracture with routine healing: Secondary | ICD-10-CM | POA: Diagnosis not present

## 2015-09-24 LAB — AFB CULTURE WITH SMEAR (NOT AT ARMC): ACID FAST SMEAR: NONE SEEN

## 2015-09-29 DIAGNOSIS — E784 Other hyperlipidemia: Secondary | ICD-10-CM | POA: Diagnosis not present

## 2015-09-29 DIAGNOSIS — I10 Essential (primary) hypertension: Secondary | ICD-10-CM | POA: Diagnosis not present

## 2015-12-07 ENCOUNTER — Other Ambulatory Visit: Payer: Self-pay

## 2016-04-04 DIAGNOSIS — E784 Other hyperlipidemia: Secondary | ICD-10-CM | POA: Diagnosis not present

## 2016-04-04 DIAGNOSIS — Z0000 Encounter for general adult medical examination without abnormal findings: Secondary | ICD-10-CM | POA: Diagnosis not present

## 2016-04-04 DIAGNOSIS — K219 Gastro-esophageal reflux disease without esophagitis: Secondary | ICD-10-CM | POA: Diagnosis not present

## 2016-04-04 DIAGNOSIS — I10 Essential (primary) hypertension: Secondary | ICD-10-CM | POA: Diagnosis not present

## 2016-05-04 ENCOUNTER — Other Ambulatory Visit: Payer: Self-pay

## 2016-06-09 ENCOUNTER — Other Ambulatory Visit: Payer: Self-pay | Admitting: Family Medicine

## 2016-06-13 ENCOUNTER — Ambulatory Visit (INDEPENDENT_AMBULATORY_CARE_PROVIDER_SITE_OTHER): Payer: Commercial Managed Care - HMO | Admitting: Family Medicine

## 2016-06-13 ENCOUNTER — Encounter: Payer: Self-pay | Admitting: Family Medicine

## 2016-06-13 VITALS — BP 130/62 | HR 78 | Ht 69.0 in | Wt 185.0 lb

## 2016-06-13 DIAGNOSIS — L439 Lichen planus, unspecified: Secondary | ICD-10-CM

## 2016-06-13 DIAGNOSIS — Z0000 Encounter for general adult medical examination without abnormal findings: Secondary | ICD-10-CM

## 2016-06-13 DIAGNOSIS — R3989 Other symptoms and signs involving the genitourinary system: Secondary | ICD-10-CM | POA: Diagnosis not present

## 2016-06-13 DIAGNOSIS — Z862 Personal history of diseases of the blood and blood-forming organs and certain disorders involving the immune mechanism: Secondary | ICD-10-CM

## 2016-06-13 DIAGNOSIS — K219 Gastro-esophageal reflux disease without esophagitis: Secondary | ICD-10-CM | POA: Diagnosis not present

## 2016-06-13 MED ORDER — OMEPRAZOLE 20 MG PO CPDR: DELAYED_RELEASE_CAPSULE | ORAL | 3 refills | Status: DC

## 2016-06-13 NOTE — Progress Notes (Signed)
Name: Marco Sims   MRN: [id number]    DOB: 1934/04/27   Date:06/13/2016       Progress Note  Subjective  Chief Complaint  Chief Complaint  Patient presents with  . Annual Exam  . Gastroesophageal Reflux  . Rash  . Anemia    Patient presents for annual physical exam.   Gastroesophageal Reflux  He complains of heartburn. He reports no abdominal pain, no belching, no chest pain, no choking, no coughing, no dysphagia, no early satiety, no hoarse voice, no nausea, no sore throat, no stridor, no tooth decay, no water brash or no wheezing. This is a new problem. The current episode started more than 1 year ago. The problem occurs occasionally. The problem has been waxing and waning. The heartburn is of mild intensity. The heartburn does not wake him from sleep. The heartburn does not limit his activity. The heartburn doesn't change with position. The symptoms are aggravated by certain foods. Pertinent negatives include no anemia, fatigue, melena, muscle weakness, orthopnea or weight loss. There are no known risk factors. He has tried a PPI for the symptoms. The treatment provided mild relief. Past procedures do not include an abdominal ultrasound, an EGD, esophageal manometry, esophageal pH monitoring, H. pylori antibody titer or a UGI.    No problem-specific Assessment & Plan notes found for this encounter.   Past Medical History:  Diagnosis Date  . Allergy    seaasonal  . Anemia   . GERD (gastroesophageal reflux disease)   . Hypertension   . Lichen simplex chronicus     Past Surgical History:  Procedure Laterality Date  . Colles' fracture right 1956    . COLONOSCOPY  2013  . FRACTURE SURGERY    . fractured left arm radius and  ulna 1955    . fractured right tibiofibular 2009    . HAND SURGERY  06/02/11  . HERNIA REPAIR    . herniorpahy, rt  05/09/2009  . NERVE REPAIR Right 08/12/2015   Procedure: EXPLORATION REPAIR  NERVE, DEBRIDEMENT FRACTURE RIGHT INDEX MIDDLE FINGER;   Surgeon: Daryll Brod, MD;  Location: [facility];  Service: Orthopedics;  Laterality: Right;  . ruptured left Achilles tendon 1980    . surgery for cubital tunnel syndrome and left arm 1999    . tonsillectomy 1942      Family History  Problem Relation Age of Onset  . Mental illness Mother   . Cancer Brother     Social History   Social History  . Marital status: Widowed    Spouse name: N/A  . Number of children: N/A  . Years of education: N/A   Occupational History  . Not on file.   Social History Main Topics  . Smoking status: Never Smoker  . Smokeless tobacco: Never Used  . Alcohol use 0.0 oz/week     Comment: rare; 1 to 2 beers a month  . Drug use: No  . Sexual activity: Yes   Other Topics Concern  . Not on file   Social History Narrative  . No narrative on file    No Known Allergies   Review of Systems  Constitutional: Negative for chills, fatigue, fever, malaise/fatigue and weight loss.  HENT: Negative for ear discharge, ear pain, hoarse voice and sore throat.   Eyes: Negative for blurred vision.  Respiratory: Negative for cough, sputum production, choking, shortness of breath and wheezing.   Cardiovascular: Negative for chest pain, palpitations, claudication and leg swelling.  Gastrointestinal: Positive  for heartburn. Negative for abdominal pain, blood in stool, constipation, diarrhea, dysphagia, melena and nausea.  Genitourinary: Negative for dysuria, frequency, hematuria and urgency.  Musculoskeletal: Negative for back pain, joint pain, myalgias, muscle weakness and neck pain.  Skin: Negative for rash.  Neurological: Negative for dizziness, tingling, sensory change, focal weakness and headaches.  Endo/Heme/Allergies: Negative for environmental allergies and polydipsia. Does not bruise/bleed easily.  Psychiatric/Behavioral: Negative for depression and suicidal ideas. The patient is not nervous/anxious and does not have insomnia.       Objective  Vitals:   06/13/16 0834  BP: 130/62  Pulse: 78  Weight: 185 lb (83.9 kg)  Height: 5\' 9"  (1.753 m)    Physical Exam  Constitutional: He is oriented to person, place, and time and well-developed, well-nourished, and in no distress.  HENT:  Head: Normocephalic.  Right Ear: Tympanic membrane, external ear and ear canal normal.  Left Ear: Tympanic membrane, external ear and ear canal normal.  Nose: Nose normal.  Mouth/Throat: Uvula is midline, oropharynx is clear and moist and mucous membranes are normal.  Eyes: Conjunctivae and EOM are normal. Right eye exhibits no discharge. Left eye exhibits no discharge. No scleral icterus.  Fundoscopic exam:      The right eye shows no arteriolar narrowing, no AV nicking, no hemorrhage and no papilledema.       The left eye shows no arteriolar narrowing, no AV nicking, no hemorrhage and no papilledema.  Neck: Normal range of motion. Neck supple. Normal carotid pulses, no hepatojugular reflux and no JVD present. Carotid bruit is not present. No tracheal deviation present. No thyromegaly present.  Cardiovascular: Normal rate, regular rhythm, S1 normal, S2 normal, normal heart sounds, intact distal pulses and normal pulses.  PMI is not displaced.  Exam reveals no gallop, no S3, no S4 and no friction rub.   No murmur heard. Pulmonary/Chest: Breath sounds normal. No respiratory distress. He has no wheezes. He has no rales. Right breast exhibits no inverted nipple, no mass, no nipple discharge, no skin change and no tenderness. Left breast exhibits no inverted nipple, no mass, no nipple discharge, no skin change and no tenderness. Breasts are symmetrical.  Abdominal: Soft. Normal aorta and bowel sounds are normal. He exhibits no mass. There is no hepatosplenomegaly. There is no tenderness. There is no rebound, no guarding and no CVA tenderness.  Genitourinary: Rectum normal, testes/scrotum normal and penis normal. Rectal exam shows guaiac  negative stool. Prostate is enlarged. Prostate is not tender.  Genitourinary Comments: Left lobe enlarged /firm  Musculoskeletal: Normal range of motion. He exhibits no edema or tenderness.  Lymphadenopathy:    He has no cervical adenopathy.    He has no axillary adenopathy.  Neurological: He is alert and oriented to person, place, and time. He has normal sensation, normal strength, normal reflexes and intact cranial nerves. No cranial nerve deficit.  Skin: Skin is warm. Rash noted. Rash is not macular.     C/w lichen planus  Psychiatric: Mood and affect normal.  Nursing note and vitals reviewed.     Assessment & Plan  Problem List Items Addressed This Visit      Digestive   GERD   Relevant Medications   omeprazole (PRILOSEC) 20 MG capsule    Other Visit Diagnoses    Annual physical exam    -  Primary   Relevant Orders   Renal Function Panel   Lipid Profile   Abnormal prostate exam       Relevant Orders  PSA, total and free   CBC w/Diff/Platelet   Ambulatory referral to Urology   Hx of iron deficiency anemia       Relevant Orders   Ferritin   Lichen planus       Relevant Orders   Ambulatory referral to Dermatology        Dr. Otilio Miu [facility]  06/13/16

## 2016-06-15 LAB — RENAL FUNCTION PANEL
Albumin: 4.1 g/dL (ref 3.5–4.7)
BUN / CREAT RATIO: 18 (ref 10–24)
BUN: 18 mg/dL (ref 8–27)
CALCIUM: 9 mg/dL (ref 8.6–10.2)
CHLORIDE: 105 mmol/L (ref 96–106)
CO2: 24 mmol/L (ref 18–29)
Creatinine, Ser: 0.98 mg/dL (ref 0.76–1.27)
GFR calc non Af Amer: 73 mL/min/{1.73_m2} (ref >59)
GFR, EST AFRICAN AMERICAN: 84 mL/min/{1.73_m2} (ref >59)
GLUCOSE: 98 mg/dL (ref 65–99)
Phosphorus: 2.6 mg/dL (ref 2.5–4.5)
Potassium: 4.4 mmol/L (ref 3.5–5.2)
SODIUM: 146 mmol/L — AB (ref 134–144)

## 2016-06-15 LAB — LIPID PANEL
Chol/HDL Ratio: 4.9 ratio units (ref 0.0–5.0)
Cholesterol, Total: 220 mg/dL — ABNORMAL HIGH (ref 100–199)
HDL: 45 mg/dL (ref >39)
LDL Calculated: 140 mg/dL — ABNORMAL HIGH (ref 0–99)
Triglycerides: 174 mg/dL — ABNORMAL HIGH (ref 0–149)
VLDL CHOLESTEROL CAL: 35 mg/dL (ref 5–40)

## 2016-06-15 LAB — PSA, TOTAL AND FREE
PROSTATE SPECIFIC AG, SERUM: 1.7 ng/mL (ref 0.0–4.0)
PSA FREE PCT: 42.4 %
PSA FREE: 0.72 ng/mL

## 2016-06-15 LAB — CBC WITH DIFFERENTIAL/PLATELET
BASOS ABS: 0 10*3/uL (ref 0.0–0.2)
BASOS: 0 %
EOS (ABSOLUTE): 0.2 10*3/uL (ref 0.0–0.4)
Eos: 5 %
Hematocrit: 39.5 % (ref 37.5–51.0)
Hemoglobin: 13.8 g/dL (ref 12.6–17.7)
Immature Grans (Abs): 0 10*3/uL (ref 0.0–0.1)
Immature Granulocytes: 0 %
Lymphocytes Absolute: 1.1 10*3/uL (ref 0.7–3.1)
Lymphs: 24 %
MCH: 29 pg (ref 26.6–33.0)
MCHC: 34.9 g/dL (ref 31.5–35.7)
MCV: 83 fL (ref 79–97)
MONOS ABS: 0.3 10*3/uL (ref 0.1–0.9)
Monocytes: 7 %
NEUTROS ABS: 3 10*3/uL (ref 1.4–7.0)
NEUTROS PCT: 64 %
PLATELETS: 198 10*3/uL (ref 150–379)
RBC: 4.76 x10E6/uL (ref 4.14–5.80)
RDW: 14.7 % (ref 12.3–15.4)
WBC: 4.7 10*3/uL (ref 3.4–10.8)

## 2016-06-15 LAB — FERRITIN: Ferritin: 53 ng/mL (ref 30–400)

## 2016-07-03 DIAGNOSIS — L280 Lichen simplex chronicus: Secondary | ICD-10-CM | POA: Diagnosis not present

## 2016-07-24 ENCOUNTER — Ambulatory Visit (INDEPENDENT_AMBULATORY_CARE_PROVIDER_SITE_OTHER): Payer: Commercial Managed Care - HMO | Admitting: Family Medicine

## 2016-07-24 ENCOUNTER — Encounter: Payer: Self-pay | Admitting: Family Medicine

## 2016-07-24 VITALS — BP 148/78 | HR 65 | Resp 16 | Ht 69.0 in | Wt 190.6 lb

## 2016-07-24 DIAGNOSIS — R079 Chest pain, unspecified: Secondary | ICD-10-CM

## 2016-07-24 DIAGNOSIS — K219 Gastro-esophageal reflux disease without esophagitis: Secondary | ICD-10-CM | POA: Diagnosis not present

## 2016-07-24 NOTE — Progress Notes (Addendum)
Name: Marco Sims   MRN: [id number]    DOB: 12-07-1933   Date:07/25/2016       Progress Note  Subjective  Chief Complaint  Chief Complaint  Patient presents with  . Sore Throat    woke in middle of night with acid in throat.     Chest Pain   This is a recurrent problem. The current episode started in the past 7 days (onset Saturday night). The onset quality is sudden. The problem occurs intermittently. The problem has been waxing and waning. The pain is present in the substernal region. The pain is at a severity of 5/10. The pain is moderate. The quality of the pain is described as tightness. The pain radiates to the right neck and left neck. Associated symptoms include shortness of breath. Pertinent negatives include no abdominal pain, back pain, claudication, cough, diaphoresis, dizziness, fever, headaches, irregular heartbeat, leg pain, malaise/fatigue, nausea, palpitations, PND, sputum production or weakness. Associated symptoms comments: palpatation. He has tried nothing (elevated head) for the symptoms. The treatment provided mild relief.  Pertinent negatives for past medical history include no aneurysm, no anxiety/panic attacks, no CAD and no MI.    No problem-specific Assessment & Plan notes found for this encounter.   Past Medical History:  Diagnosis Date  . Allergy    seaasonal  . Anemia   . GERD (gastroesophageal reflux disease)   . Hypertension   . Lichen simplex chronicus     Past Surgical History:  Procedure Laterality Date  . Colles' fracture right 1956    . COLONOSCOPY  2013  . FRACTURE SURGERY    . fractured left arm radius and  ulna 1955    . fractured right tibiofibular 2009    . HAND SURGERY  06/02/11  . HERNIA REPAIR    . herniorpahy, rt  05/09/2009  . NERVE REPAIR Right 08/12/2015   Procedure: EXPLORATION REPAIR  NERVE, DEBRIDEMENT FRACTURE RIGHT INDEX MIDDLE FINGER;  Surgeon: Daryll Brod, MD;  Location: [facility];  Service: Orthopedics;   Laterality: Right;  . ruptured left Achilles tendon 1980    . surgery for cubital tunnel syndrome and left arm 1999    . tonsillectomy 1942      Family History  Problem Relation Age of Onset  . Mental illness Mother   . Cancer Brother     Social History   Social History  . Marital status: Widowed    Spouse name: N/A  . Number of children: N/A  . Years of education: N/A   Occupational History  . Not on file.   Social History Main Topics  . Smoking status: Never Smoker  . Smokeless tobacco: Never Used  . Alcohol use 0.0 oz/week     Comment: rare; 1 to 2 beers a month  . Drug use: No  . Sexual activity: Yes   Other Topics Concern  . Not on file   Social History Narrative  . No narrative on file    No Known Allergies   Review of Systems  Constitutional: Negative for chills, diaphoresis, fever, malaise/fatigue and weight loss.  HENT: Negative for ear discharge, ear pain and sore throat.   Eyes: Negative for blurred vision.  Respiratory: Positive for shortness of breath. Negative for cough, sputum production and wheezing.   Cardiovascular: Positive for chest pain. Negative for palpitations, claudication, leg swelling and PND.  Gastrointestinal: Positive for heartburn. Negative for abdominal pain, blood in stool, constipation, diarrhea, melena and nausea.  Genitourinary: Negative  for dysuria, frequency, hematuria and urgency.  Musculoskeletal: Negative for back pain, joint pain, myalgias and neck pain.  Skin: Negative for rash.  Neurological: Negative for dizziness, tingling, sensory change, focal weakness, weakness and headaches.  Endo/Heme/Allergies: Negative for environmental allergies and polydipsia. Does not bruise/bleed easily.  Psychiatric/Behavioral: Negative for depression and suicidal ideas. The patient is not nervous/anxious and does not have insomnia.      Objective  Vitals:   07/24/16 1136  BP: (!) 148/78  Pulse: 65  Resp: 16  SpO2: 98%  Weight:  190 lb 9.6 oz (86.5 kg)  Height: 5\' 9"  (1.753 m)    Physical Exam  Constitutional: He is oriented to person, place, and time and well-developed, well-nourished, and in no distress.  HENT:  Head: Normocephalic.  Right Ear: External ear normal.  Left Ear: External ear normal.  Nose: Nose normal.  Mouth/Throat: Oropharynx is clear and moist.  Eyes: Conjunctivae and EOM are normal. Pupils are equal, round, and reactive to light. Right eye exhibits no discharge. Left eye exhibits no discharge. No scleral icterus.  Neck: Normal range of motion. Neck supple. No JVD present. No tracheal deviation present. No thyromegaly present.  Cardiovascular: Normal rate, regular rhythm, normal heart sounds and intact distal pulses.  Exam reveals no gallop and no friction rub.   No murmur heard. Pulmonary/Chest: Breath sounds normal. No respiratory distress. He has no wheezes. He has no rales. He exhibits no tenderness.  Abdominal: Soft. Bowel sounds are normal. He exhibits no mass. There is no hepatosplenomegaly. There is no tenderness. There is no rebound, no guarding and no CVA tenderness.  Musculoskeletal: Normal range of motion. He exhibits no edema or tenderness.  Lymphadenopathy:    He has no cervical adenopathy.  Neurological: He is alert and oriented to person, place, and time. He has normal sensation, normal strength, normal reflexes and intact cranial nerves. No cranial nerve deficit.  Skin: Skin is warm. No rash noted.  Psychiatric: Mood and affect normal.  Nursing note and vitals reviewed.     Assessment & Plan  Problem List Items Addressed This Visit      Digestive   GERD   Relevant Orders   DG Esophagus    Other Visit Diagnoses    Chest pain, unspecified type    -  Primary   if pain resumes go to er   Relevant Orders   EKG 12-Lead (Completed)        Dr. Mac[facility]  07/25/16

## 2016-07-24 NOTE — Patient Instructions (Signed)
Food Choices for Gastroesophageal Reflux Disease, Adult When you have gastroesophageal reflux disease (GERD), the foods you eat and your eating habits are very important. Choosing the right foods can help ease the discomfort of GERD. What general guidelines do I need to follow?  Choose fruits, vegetables, whole grains, low-fat dairy products, and low-fat meat, fish, and poultry.  Limit fats such as oils, salad dressings, butter, nuts, and avocado.  Keep a food diary to identify foods that cause symptoms.  Avoid foods that cause reflux. These may be different for different people.  Eat frequent small meals instead of three large meals each day.  Eat your meals slowly, in a relaxed setting.  Limit fried foods.  Cook foods using methods other than frying.  Avoid drinking alcohol.  Avoid drinking large amounts of liquids with your meals.  Avoid bending over or lying down until 2-3 hours after eating. What foods are not recommended? The following are some foods and drinks that may worsen your symptoms: Vegetables  Tomatoes. Tomato juice. Tomato and spaghetti sauce. Chili peppers. Onion and garlic. Horseradish. Fruits  Oranges, grapefruit, and lemon (fruit and juice). Meats  High-fat meats, fish, and poultry. This includes hot dogs, ribs, ham, sausage, salami, and bacon. Dairy  Whole milk and chocolate milk. Sour cream. Cream. Butter. Ice cream. Cream cheese. Beverages  Coffee and tea, with or without caffeine. Carbonated beverages or energy drinks. Condiments  Hot sauce. Barbecue sauce. Sweets/Desserts  Chocolate and cocoa. Donuts. Peppermint and spearmint. Fats and Oils  High-fat foods, including [place] fries and potato chips. Other  Vinegar. Strong spices, such as black pepper, white pepper, red pepper, cayenne, curry powder, cloves, ginger, and chili powder. The items listed above may not be a complete list of foods and beverages to avoid. Contact your dietitian for more  information.  This information is not intended to replace advice given to you by your health care provider. Make sure you discuss any questions you have with your health care provider. Document Released: 07/24/2005 Document Revised: 12/30/2015 Document Reviewed: 05/28/2013 Elsevier Interactive Patient Education  2017 [employer].

## 2016-07-25 ENCOUNTER — Ambulatory Visit: Payer: Self-pay | Admitting: Family Medicine

## 2016-07-28 ENCOUNTER — Ambulatory Visit
Admission: RE | Admit: 2016-07-28 | Discharge: 2016-07-28 | Disposition: A | Discharge status: Home / Self Care | Payer: Commercial Managed Care - HMO | Source: Ambulatory Visit | Attending: Family Medicine | Admitting: Family Medicine

## 2016-07-28 DIAGNOSIS — K228 Other specified diseases of esophagus: Secondary | ICD-10-CM | POA: Diagnosis not present

## 2016-07-28 DIAGNOSIS — K219 Gastro-esophageal reflux disease without esophagitis: Secondary | ICD-10-CM | POA: Diagnosis not present

## 2016-07-28 DIAGNOSIS — K449 Diaphragmatic hernia without obstruction or gangrene: Secondary | ICD-10-CM | POA: Diagnosis not present

## 2016-10-02 DIAGNOSIS — R072 Precordial pain: Secondary | ICD-10-CM | POA: Diagnosis not present

## 2016-10-02 DIAGNOSIS — E784 Other hyperlipidemia: Secondary | ICD-10-CM | POA: Diagnosis not present

## 2016-10-02 DIAGNOSIS — I10 Essential (primary) hypertension: Secondary | ICD-10-CM | POA: Diagnosis not present

## 2016-10-02 DIAGNOSIS — K219 Gastro-esophageal reflux disease without esophagitis: Secondary | ICD-10-CM | POA: Diagnosis not present

## 2016-11-30 ENCOUNTER — Encounter: Payer: Self-pay | Admitting: Family Medicine

## 2016-11-30 ENCOUNTER — Ambulatory Visit (INDEPENDENT_AMBULATORY_CARE_PROVIDER_SITE_OTHER): Payer: Medicare HMO | Admitting: Family Medicine

## 2016-11-30 VITALS — BP 120/70 | HR 80 | Temp 97.3°F | Ht 69.0 in | Wt 189.0 lb

## 2016-11-30 DIAGNOSIS — J0100 Acute maxillary sinusitis, unspecified: Secondary | ICD-10-CM | POA: Diagnosis not present

## 2016-11-30 DIAGNOSIS — J40 Bronchitis, not specified as acute or chronic: Secondary | ICD-10-CM

## 2016-11-30 MED ORDER — AMOXICILLIN 500 MG PO CAPS: 500.0000 mg | ORAL_CAPSULE | Freq: Three times a day (TID) | ORAL | 0 refills | Status: DC

## 2016-11-30 MED ORDER — FLUTICASONE PROPIONATE 50 MCG/ACT NA SUSP: 2.0000 | Freq: Every day | NASAL | 6 refills | Status: DC

## 2016-11-30 NOTE — Progress Notes (Signed)
Name: Marco Sims   MRN: [id number]    DOB: Nov 07, 1933   Date:11/30/2016       Progress Note  Subjective  Chief Complaint  Chief Complaint  Patient presents with  . Sinusitis    mowed grass last weekend- has since felt congested and stuffy nose- has taken 2 doses of mucinex- no production    Sinusitis  This is a new problem. The current episode started in the past 7 days. The problem has been gradually worsening since onset. There has been no fever. Associated symptoms include congestion, coughing, shortness of breath, sinus pressure and sneezing. Pertinent negatives include no chills, diaphoresis, ear pain, headaches, hoarse voice, neck pain, sore throat or swollen glands. Past treatments include acetaminophen (mucinex). The treatment provided mild relief.  Cough  This is a new problem. The problem occurs hourly. The cough is non-productive. Associated symptoms include nasal congestion, postnasal drip, shortness of breath and wheezing. Pertinent negatives include no chest pain, chills, ear pain, fever, headaches, heartburn, myalgias, rash, sore throat or weight loss. The symptoms are aggravated by pollens. The treatment provided mild relief. His past medical history is significant for asthma and bronchitis. There is no history of environmental allergies.    No problem-specific Assessment & Plan notes found for this encounter.   Past Medical History:  Diagnosis Date  . Allergy    seaasonal  . Anemia   . GERD (gastroesophageal reflux disease)   . Hypertension   . Lichen simplex chronicus     Past Surgical History:  Procedure Laterality Date  . Colles' fracture right 1956    . COLONOSCOPY  2013  . FRACTURE SURGERY    . fractured left arm radius and  ulna 1955    . fractured right tibiofibular 2009    . HAND SURGERY  06/02/11  . HERNIA REPAIR    . herniorpahy, rt  05/09/2009  . NERVE REPAIR Right 08/12/2015   Procedure: EXPLORATION REPAIR  NERVE, DEBRIDEMENT FRACTURE RIGHT  INDEX MIDDLE FINGER;  Surgeon: Daryll Brod, MD;  Location: [place];  Service: Orthopedics;  Laterality: Right;  . ruptured left Achilles tendon 1980    . surgery for cubital tunnel syndrome and left arm 1999    . tonsillectomy 1942      Family History  Problem Relation Age of Onset  . Mental illness Mother   . Cancer Brother     Social History   Social History  . Marital status: Widowed    Spouse name: N/A  . Number of children: N/A  . Years of education: N/A   Occupational History  . Not on file.   Social History Main Topics  . Smoking status: Never Smoker  . Smokeless tobacco: Never Used  . Alcohol use 0.0 oz/week     Comment: rare; 1 to 2 beers a month  . Drug use: No  . Sexual activity: Yes   Other Topics Concern  . Not on file   Social History Narrative  . No narrative on file    No Known Allergies  Outpatient Medications Prior to Visit  Medication Sig Dispense Refill  . amLODipine (NORVASC) 10 MG tablet Take 1 tablet by mouth daily. Dr [name]    . aspirin 81 MG tablet Take 81 mg by mouth daily. Reported on 07/28/2015    . Multiple Vitamin (MULTIVITAMIN WITH MINERALS) TABS tablet Take 1 tablet by mouth daily.    . Omega-3 1000 MG CAPS Take 1 capsule by mouth 3 (three)  times a week.    Marland Kitchen omeprazole (PRILOSEC) 20 MG capsule 1 cap qd 90 capsule 3  . Pediatric Multivitamins-Iron (FLINTSTONES PLUS IRON PO) Take 1 tablet by mouth daily.    Marland Kitchen FLUZONE HIGH-DOSE 0.5 ML SUSY      No facility-administered medications prior to visit.     Review of Systems  Constitutional: Negative for chills, diaphoresis, fever, malaise/fatigue and weight loss.  HENT: Positive for congestion, postnasal drip, sinus pressure and sneezing. Negative for ear discharge, ear pain, hoarse voice and sore throat.   Eyes: Negative for blurred vision.  Respiratory: Positive for cough, shortness of breath and wheezing. Negative for sputum production.   Cardiovascular: Negative  for chest pain, palpitations and leg swelling.  Gastrointestinal: Negative for abdominal pain, blood in stool, constipation, diarrhea, heartburn, melena and nausea.  Genitourinary: Negative for dysuria, frequency, hematuria and urgency.  Musculoskeletal: Negative for back pain, joint pain, myalgias and neck pain.  Skin: Negative for rash.  Neurological: Negative for dizziness, tingling, sensory change, focal weakness and headaches.  Endo/Heme/Allergies: Negative for environmental allergies and polydipsia. Does not bruise/bleed easily.  Psychiatric/Behavioral: Negative for depression and suicidal ideas. The patient is not nervous/anxious and does not have insomnia.      Objective  Vitals:   11/30/16 0903  BP: 120/70  Pulse: 80  Temp: 97.3 F (36.3 C)  TempSrc: Oral  Weight: 189 lb (85.7 kg)  Height: 5\' 9"  (1.753 m)    Physical Exam  Constitutional: He is oriented to person, place, and time and well-developed, well-nourished, and in no distress.  HENT:  Head: Normocephalic.  Right Ear: External ear normal.  Left Ear: External ear normal.  Nose: Nose normal.  Mouth/Throat: Oropharynx is clear and moist.  Eyes: EOM and lids are normal. Pupils are equal, round, and reactive to light. Right eye exhibits no discharge. Left eye exhibits no discharge. Right conjunctiva is injected. Left conjunctiva is injected. No scleral icterus.  Neck: Normal range of motion. Neck supple. No JVD present. No tracheal deviation present. No thyromegaly present.  Cardiovascular: Normal rate, regular rhythm, normal heart sounds and intact distal pulses.  Exam reveals no gallop and no friction rub.   No murmur heard. Pulmonary/Chest: Breath sounds normal. No respiratory distress. He has no wheezes. He has no rales.  Abdominal: Soft. Bowel sounds are normal. He exhibits no mass. There is no hepatosplenomegaly. There is no tenderness. There is no rebound, no guarding and no CVA tenderness.  Musculoskeletal:  Normal range of motion. He exhibits no edema or tenderness.  Lymphadenopathy:    He has no cervical adenopathy.  Neurological: He is alert and oriented to person, place, and time. He has normal sensation, normal strength, normal reflexes and intact cranial nerves. No cranial nerve deficit.  Skin: Skin is warm. No rash noted.  Psychiatric: Mood and affect normal.  Nursing note and vitals reviewed.     Assessment & Plan  Problem List Items Addressed This Visit    None    Visit Diagnoses    Acute maxillary sinusitis, recurrence not specified    -  Primary   Relevant Medications   fluticasone (FLONASE) 50 MCG/ACT nasal spray   amoxicillin (AMOXIL) 500 MG capsule   Bronchitis       Relevant Medications   amoxicillin (AMOXIL) 500 MG capsule      Meds ordered this encounter  Medications  . fluticasone (FLONASE) 50 MCG/ACT nasal spray    Sig: Place 2 sprays into both nostrils daily.  Dispense:  16 g    Refill:  6  . amoxicillin (AMOXIL) 500 MG capsule    Sig: Take 1 capsule (500 mg total) by mouth 3 (three) times daily.    Dispense:  30 capsule    Refill:  0      Dr. Otilio Miu [facility]  11/30/16

## 2017-04-02 DIAGNOSIS — E784 Other hyperlipidemia: Secondary | ICD-10-CM | POA: Diagnosis not present

## 2017-04-02 DIAGNOSIS — I10 Essential (primary) hypertension: Secondary | ICD-10-CM | POA: Diagnosis not present

## 2017-04-02 DIAGNOSIS — K219 Gastro-esophageal reflux disease without esophagitis: Secondary | ICD-10-CM | POA: Diagnosis not present

## 2017-05-07 DIAGNOSIS — R69 Illness, unspecified: Secondary | ICD-10-CM | POA: Diagnosis not present

## 2017-05-08 ENCOUNTER — Telehealth: Payer: Self-pay | Admitting: Family Medicine

## 2017-05-08 NOTE — Telephone Encounter (Signed)
Called pt to sched for Annual Wellness Visit with Nurse Health Advisor. C/b #  [phone]  Kathryn Brown ° °

## 2017-07-06 ENCOUNTER — Other Ambulatory Visit: Payer: Self-pay | Admitting: Family Medicine

## 2017-08-30 ENCOUNTER — Ambulatory Visit (INDEPENDENT_AMBULATORY_CARE_PROVIDER_SITE_OTHER): Payer: Medicare HMO

## 2017-08-30 VITALS — BP 140/68 | HR 80 | Temp 97.7°F | Resp 12 | Ht 69.0 in | Wt 184.6 lb

## 2017-08-30 DIAGNOSIS — Z0000 Encounter for general adult medical examination without abnormal findings: Secondary | ICD-10-CM

## 2017-08-30 DIAGNOSIS — Z23 Encounter for immunization: Secondary | ICD-10-CM | POA: Diagnosis not present

## 2017-08-30 NOTE — Progress Notes (Signed)
Subjective:   Marco Sims is a 84 y.o. male who presents for Medicare Annual/Subsequent preventive examination.  Review of Systems:  N/A Cardiac Risk Factors include: sedentary lifestyle;dyslipidemia;advanced age (>54men, >45 women);male gender     Objective:    Vitals: BP 140/68 (BP Location: Right Arm, Patient Position: Sitting, Cuff Size: Normal)   Pulse 80   Temp 97.7 F (36.5 C) (Oral)   Resp 12   Ht 5\' 9"  (1.753 m)   Wt 184 lb 9.6 oz (83.7 kg)   BMI 27.26 kg/m   Body mass index is 27.26 kg/m.  Advanced Directives 08/30/2017 08/12/2015 08/10/2015 07/28/2015 02/26/2015 02/24/2015 02/18/2015  Does Patient Have a Medical Advance Directive? No No Yes No No No No  Type of Advance Directive - Public librarian;Living will - - - -  Would patient like information on creating a medical advance directive? Yes (MAU/Ambulatory/Procedural Areas - Information given) No - patient declined information - - - - No - patient declined information    Tobacco Social History   Tobacco Use  Smoking Status Never Smoker  Smokeless Tobacco Never Used  Tobacco Comment   smoking cessation materials not required     Counseling given: No Comment: smoking cessation materials not required   Clinical Intake:  Pre-visit preparation completed: Yes  Pain : No/denies pain     BMI - recorded: 27.26 Nutritional Status: BMI 25 -29 Overweight Nutritional Risks: None Diabetes: No  How often do you need to have someone help you when you read instructions, pamphlets, or other written materials from your doctor or pharmacy?: 1 - Never  Interpreter Needed?: No  Information entered by :: AEversole, LPN  Past Medical History:  Diagnosis Date  . Allergy    seaasonal  . Anemia   . GERD (gastroesophageal reflux disease)   . Hypertension   . Lichen simplex chronicus    Past Surgical History:  Procedure Laterality Date  . Colles' fracture right 1956    . COLONOSCOPY  2013  .  FRACTURE SURGERY    . fractured left arm radius and  ulna 1955    . fractured right tibiofibular 2009    . HAND SURGERY  06/02/11  . HERNIA REPAIR    . herniorpahy, rt  05/09/2009  . NERVE REPAIR Right 08/12/2015   Procedure: EXPLORATION REPAIR  NERVE, DEBRIDEMENT FRACTURE RIGHT INDEX MIDDLE FINGER;  Surgeon: Daryll Brod, MD;  Location: [place];  Service: Orthopedics;  Laterality: Right;  . ruptured left Achilles tendon 1980    . surgery for cubital tunnel syndrome and left arm 1999    . tonsillectomy 1942     Family History  Problem Relation Age of Onset  . Parkinson's disease Mother   . Arthritis Father   . Cancer Brother    Social History   Socioeconomic History  . Marital status: Soil scientist    Spouse name: None  . Number of children: 3  . Years of education: None  . Highest education level: Bachelor's degree (e.g., BA, AB, BS)  Social Needs  . Financial resource strain: Not hard at all  . Food insecurity - worry: Never true  . Food insecurity - inability: Never true  . Transportation needs - medical: No  . Transportation needs - non-medical: No  Occupational History  . Occupation: Retired  Tobacco Use  . Smoking status: Never Smoker  . Smokeless tobacco: Never Used  . Tobacco comment: smoking cessation materials not required  Substance and  Sexual Activity  . Alcohol use: Yes    Alcohol/week: 0.0 oz    Comment: rare; 1 to 2 beers a month  . Drug use: No  . Sexual activity: Not Currently  Other Topics Concern  . None  Social History Narrative  . None    Outpatient Encounter Medications as of 08/30/2017  Medication Sig  . amLODipine (NORVASC) 10 MG tablet Take 1 tablet by mouth daily. Dr [name]  . aspirin 81 MG tablet Take 81 mg by mouth daily. Reported on 07/28/2015  . fluticasone (FLONASE) 50 MCG/ACT nasal spray Place 2 sprays into both nostrils daily.  Marland Kitchen omeprazole (PRILOSEC) 20 MG capsule TAKE ONE CAPSULE BY MOUTH ONCE DAILY  .  Multiple Vitamin (MULTIVITAMIN WITH MINERALS) TABS tablet Take 1 tablet by mouth daily.  . Omega-3 1000 MG CAPS Take 1 capsule by mouth 3 (three) times a week.  . Pediatric Multivitamins-Iron (FLINTSTONES PLUS IRON PO) Take 1 tablet by mouth daily.  . [DISCONTINUED] amoxicillin (AMOXIL) 500 MG capsule Take 1 capsule (500 mg total) by mouth 3 (three) times daily.   No facility-administered encounter medications on file as of 08/30/2017.     Activities of Daily Living In your present state of health, do you have any difficulty performing the following activities: 08/30/2017  Hearing? N  Comment wears B hearing aids  Vision? N  Comment wears eyeglasses  Difficulty concentrating or making decisions? Y  Comment short term memory loss  Walking or climbing stairs? Y  Comment fatigue  Dressing or bathing? N  Doing errands, shopping? N  Preparing Food and eating ? N  Comment denies wearing dentures  Using the Toilet? N  In the past six months, have you accidently leaked urine? N  Do you have problems with loss of bowel control? N  Managing your Medications? N  Managing your Finances? N  Housekeeping or managing your Housekeeping? N  Some recent data might be hidden    Patient Care Team: [name], MD as PCP - General (Family Medicine) Corey Skains, MD as Consulting Physician (Cardiology)   Assessment:   This is a routine wellness examination for Marco Sims.  Exercise Activities and Dietary recommendations Current Exercise Habits: The patient does not participate in regular exercise at present, Exercise limited by: None identified  Goals    . Exercise 150 min/wk Moderate Activity     Recommend to exercise for at least 150 minutes per week.       Fall Risk Fall Risk  08/30/2017 06/13/2016 02/18/2015  Falls in the past year? Yes Yes No  Number falls in past yr: 1 1 -  Injury with Fall? No No -  Risk for fall due to : History of fall(s);Impaired vision - -  Risk for fall due  to: Comment wears eyeglasses - -  Follow up Falls evaluation completed;Education provided;Falls prevention discussed Falls evaluation completed -   Is the patient's home free of loose throw rugs in walkways, pet beds, electrical cords, etc?   Yes Does the patient have any grab bars in the bathroom? Yes  Does the patient use a shower chair when bathing? Yes Does the patient have any stairs in or around the home? Yes If so, are there any handrails?  Yes Does the patient have adequate lighting?  Yes Does the patient use a cane, walker or w/c? No Does the patient use of an elevated toilet seat? No  Timed Get Up and Go Performed: Yes. Pt ambulated 10 feet within  8 sec. Gait stead-fast and without the use of an assistive device. No intervention required at this time. Fall risk prevention has been discussed.  Depression Screen PHQ 2/9 Scores 08/30/2017 06/13/2016 02/18/2015  PHQ - 2 Score 0 0 0    Cognitive Function     6CIT Screen 08/30/2017  What Year? 4 points  What month? 0 points  What time? 0 points  Count back from 20 0 points  Months in reverse 0 points  Repeat phrase 0 points  Total Score 4    Immunization History  Administered Date(s) Administered  . Influenza Split 04/26/2012  . Influenza Whole 06/13/2007  . Influenza,inj,Quad PF,6+ Mos 04/18/2013, 06/03/2015  . Influenza-Unspecified 05/07/2013, 05/07/2014, 05/07/2017  . Pneumococcal Conjugate-13 01/27/2015  . Pneumococcal Polysaccharide-23 08/30/2017  . Td 08/08/2003  . Tdap 03/20/2011    Qualifies for Shingles Vaccine? Yes. Due for Zostavax or Shingrix vaccine. Education has been provided regarding the importance of this vaccine. Pt has been advised to call his insurance company to determine his out of pocket expense. Advised he may also receive this vaccine at his local pharmacy or Health Dept. Verbalized acceptance and understanding.  Screening Tests Health Maintenance  Topic Date Due  . TETANUS/TDAP  03/19/2021    . INFLUENZA VACCINE  Completed  . PNA vac Low Risk Adult  Completed   Cancer Screenings: Lung: Low Dose CT Chest recommended if Age 39-80 years, 30 pack-year currently smoking OR have quit w/in 15years. Patient does not qualify.  Colorectal: Colonoscopy completed 11/24/08. Pt states he repeated his colorectal screening in 2015 due to an upcoming hernia surgery. States he was informed colorectal screenings would no longer be required.  Additional Screenings: Hepatitis B/HIV/Syphillis: Does not qualify Hepatitis C Screening: Does not qualify    Plan:  I have personally reviewed and addressed the Medicare Annual Wellness questionnaire and have noted the following in the patient's chart:  A. Medical and social history B. Use of alcohol, tobacco or illicit drugs  C. Current medications and supplements D. Functional ability and status E.  Nutritional status F.  Physical activity G. Advance directives H. List of other physicians I.  Hospitalizations, surgeries, and ER visits in previous 12 months J.  [place] such as hearing and vision if needed, cognitive and depression L. Referrals and appointments - none  In addition, I have reviewed and discussed with patient certain preventive protocols, quality metrics, and best practice recommendations. A written personalized care plan for preventive services as well as general preventive health recommendations were provided to patient.  Signed,  Aleatha Borer, LPN Nurse Health Advisor  MD Recommendations: None

## 2017-08-30 NOTE — Patient Instructions (Signed)
Marco Sims , Thank you for taking time to come for your Medicare Wellness Visit. I appreciate your ongoing commitment to your health goals. Please review the following plan we discussed and let me know if I can assist you in the future.   Screening recommendations/referrals: Colonoscopy: No longer required Recommended yearly ophthalmology/optometry visit for glaucoma screening and checkup Recommended yearly dental visit for hygiene and checkup  Vaccinations: Influenza vaccine: Up to date Pneumococcal vaccine: Completed series Shingles vaccine: Declined. Please call your insurance company to determine your out of pocket expense. You may also receive this vaccine at your local pharmacy or Health Dept.  Advanced directives: Advance directive discussed with you today. I have provided a copy for you to complete at home and have notarized. Once this is complete please bring a copy in to our office so we can scan it into your chart.  Conditions/risks identified: Recommend to exercise for at least 150 minutes per week.  Next appointment: Please schedule an appointment with Dr. [name] within the next 30 days for follow up after today's Annual Wellness Visit.  Please schedule your Annual Wellness Visit with your Nurse Health Advisor in one year.  Preventive Care 17 Years and Older, Male Preventive care refers to lifestyle choices and visits with your health care provider that can promote health and wellness. What does preventive care include?  A yearly physical exam. This is also called an annual well check.  Dental exams once or twice a year.  Routine eye exams. Ask your health care provider how often you should have your eyes checked.  Personal lifestyle choices, including:  Daily care of your teeth and gums.  Regular physical activity.  Eating a healthy diet.  Avoiding tobacco and drug use.  Limiting alcohol use.  Practicing safe sex.  Taking low doses of aspirin every  day.  Taking vitamin and mineral supplements as recommended by your health care provider. What happens during an annual well check? The services and screenings done by your health care provider during your annual well check will depend on your age, overall health, lifestyle risk factors, and family history of disease. Counseling  Your health care provider may ask you questions about your:  Alcohol use.  Tobacco use.  Drug use.  Emotional well-being.  Home and relationship well-being.  Sexual activity.  Eating habits.  History of falls.  Memory and ability to understand (cognition).  Work and work Statistician. Screening  You may have the following tests or measurements:  Height, weight, and BMI.  Blood pressure.  Lipid and cholesterol levels. These may be checked every 5 years, or more frequently if you are over 70 years old.  Skin check.  Lung cancer screening. You may have this screening every year starting at age 59 if you have a 30-pack-year history of smoking and currently smoke or have quit within the past 15 years.  Fecal occult blood test (FOBT) of the stool. You may have this test every year starting at age 61.  Flexible sigmoidoscopy or colonoscopy. You may have a sigmoidoscopy every 5 years or a colonoscopy every 10 years starting at age 19.  Prostate cancer screening. Recommendations will vary depending on your family history and other risks.  Hepatitis C blood test.  Hepatitis B blood test.  Sexually transmitted disease (STD) testing.  Diabetes screening. This is done by checking your blood sugar (glucose) after you have not eaten for a while (fasting). You may have this done every 1-3 years.  Abdominal aortic  aneurysm (AAA) screening. You may need this if you are a current or former smoker.  Osteoporosis. You may be screened starting at age 88 if you are at high risk. Talk with your health care provider about your test results, treatment options,  and if necessary, the need for more tests. Vaccines  Your health care provider may recommend certain vaccines, such as:  Influenza vaccine. This is recommended every year.  Tetanus, diphtheria, and acellular pertussis (Tdap, Td) vaccine. You may need a Td booster every 10 years.  Zoster vaccine. You may need this after age 75.  Pneumococcal 13-valent conjugate (PCV13) vaccine. One dose is recommended after age 60.  Pneumococcal polysaccharide (PPSV23) vaccine. One dose is recommended after age 23. Talk to your health care provider about which screenings and vaccines you need and how often you need them. This information is not intended to replace advice given to you by your health care provider. Make sure you discuss any questions you have with your health care provider. Document Released: 08/20/2015 Document Revised: 04/12/2016 Document Reviewed: 05/25/2015 Elsevier Interactive Patient Education  2017 [place] Prevention in the Home Falls can cause injuries. They can happen to people of all ages. There are many things you can do to make your home safe and to help prevent falls. What can I do on the outside of my home?  Regularly fix the edges of walkways and driveways and fix any cracks.  Remove anything that might make you trip as you walk through a door, such as a raised step or threshold.  Trim any bushes or trees on the path to your home.  Use bright outdoor lighting.  Clear any walking paths of anything that might make someone trip, such as rocks or tools.  Regularly check to see if handrails are loose or broken. Make sure that both sides of any steps have handrails.  Any raised decks and porches should have guardrails on the edges.  Have any leaves, snow, or ice cleared regularly.  Use sand or salt on walking paths during winter.  Clean up any spills in your garage right away. This includes oil or grease spills. What can I do in the bathroom?  Use night  lights.  Install grab bars by the toilet and in the tub and shower. Do not use towel bars as grab bars.  Use non-skid mats or decals in the tub or shower.  If you need to sit down in the shower, use a plastic, non-slip stool.  Keep the floor dry. Clean up any water that spills on the floor as soon as it happens.  Remove soap buildup in the tub or shower regularly.  Attach bath mats securely with double-sided non-slip rug tape.  Do not have throw rugs and other things on the floor that can make you trip. What can I do in the bedroom?  Use night lights.  Make sure that you have a light by your bed that is easy to reach.  Do not use any sheets or blankets that are too big for your bed. They should not hang down onto the floor.  Have a firm chair that has side arms. You can use this for support while you get dressed.  Do not have throw rugs and other things on the floor that can make you trip. What can I do in the kitchen?  Clean up any spills right away.  Avoid walking on wet floors.  Keep items that you use a lot  in easy-to-reach places.  If you need to reach something above you, use a strong step stool that has a grab bar.  Keep electrical cords out of the way.  Do not use floor polish or wax that makes floors slippery. If you must use wax, use non-skid floor wax.  Do not have throw rugs and other things on the floor that can make you trip. What can I do with my stairs?  Do not leave any items on the stairs.  Make sure that there are handrails on both sides of the stairs and use them. Fix handrails that are broken or loose. Make sure that handrails are as long as the stairways.  Check any carpeting to make sure that it is firmly attached to the stairs. Fix any carpet that is loose or worn.  Avoid having throw rugs at the top or bottom of the stairs. If you do have throw rugs, attach them to the floor with carpet tape.  Make sure that you have a light switch at the  top of the stairs and the bottom of the stairs. If you do not have them, ask someone to add them for you. What else can I do to help prevent falls?  Wear shoes that:  Do not have high heels.  Have rubber bottoms.  Are comfortable and fit you well.  Are closed at the toe. Do not wear sandals.  If you use a stepladder:  Make sure that it is fully opened. Do not climb a closed stepladder.  Make sure that both sides of the stepladder are locked into place.  Ask someone to hold it for you, if possible.  Clearly mark and make sure that you can see:  Any grab bars or handrails.  First and last steps.  Where the edge of each step is.  Use tools that help you move around (mobility aids) if they are needed. These include:  Canes.  Walkers.  Scooters.  Crutches.  Turn on the lights when you go into a dark area. Replace any light bulbs as soon as they burn out.  Set up your furniture so you have a clear path. Avoid moving your furniture around.  If any of your floors are uneven, fix them.  If there are any pets around you, be aware of where they are.  Review your medicines with your doctor. Some medicines can make you feel dizzy. This can increase your chance of falling. Ask your doctor what other things that you can do to help prevent falls. This information is not intended to replace advice given to you by your health care provider. Make sure you discuss any questions you have with your health care provider. Document Released: 05/20/2009 Document Revised: 12/30/2015 Document Reviewed: 08/28/2014 Elsevier Interactive Patient Education  2017 [employer].

## 2017-09-11 ENCOUNTER — Other Ambulatory Visit: Payer: Self-pay

## 2017-09-11 MED ORDER — OMEPRAZOLE 20 MG PO CPDR: DELAYED_RELEASE_CAPSULE | ORAL | 0 refills | Status: DC

## 2017-10-01 DIAGNOSIS — Z809 Family history of malignant neoplasm, unspecified: Secondary | ICD-10-CM | POA: Diagnosis not present

## 2017-10-01 DIAGNOSIS — R269 Unspecified abnormalities of gait and mobility: Secondary | ICD-10-CM | POA: Diagnosis not present

## 2017-10-01 DIAGNOSIS — I10 Essential (primary) hypertension: Secondary | ICD-10-CM | POA: Diagnosis not present

## 2017-10-01 DIAGNOSIS — N529 Male erectile dysfunction, unspecified: Secondary | ICD-10-CM | POA: Diagnosis not present

## 2017-10-01 DIAGNOSIS — Z7982 Long term (current) use of aspirin: Secondary | ICD-10-CM | POA: Diagnosis not present

## 2017-10-01 DIAGNOSIS — K08409 Partial loss of teeth, unspecified cause, unspecified class: Secondary | ICD-10-CM | POA: Diagnosis not present

## 2017-10-01 DIAGNOSIS — J309 Allergic rhinitis, unspecified: Secondary | ICD-10-CM | POA: Diagnosis not present

## 2017-10-01 DIAGNOSIS — Z9181 History of falling: Secondary | ICD-10-CM | POA: Diagnosis not present

## 2017-10-01 DIAGNOSIS — K219 Gastro-esophageal reflux disease without esophagitis: Secondary | ICD-10-CM | POA: Diagnosis not present

## 2017-10-05 ENCOUNTER — Ambulatory Visit (INDEPENDENT_AMBULATORY_CARE_PROVIDER_SITE_OTHER): Payer: Medicare HMO | Admitting: Family Medicine

## 2017-10-05 ENCOUNTER — Encounter: Payer: Self-pay | Admitting: Family Medicine

## 2017-10-05 VITALS — BP 138/80 | HR 64 | Ht 69.0 in | Wt 186.0 lb

## 2017-10-05 DIAGNOSIS — L2084 Intrinsic (allergic) eczema: Secondary | ICD-10-CM | POA: Diagnosis not present

## 2017-10-05 MED ORDER — TRIAMCINOLONE ACETONIDE 0.1 % EX CREA: 1.0000 "application " | TOPICAL_CREAM | Freq: Two times a day (BID) | CUTANEOUS | 1 refills | Status: DC

## 2017-10-05 NOTE — Progress Notes (Signed)
Name: Marco Sims   MRN: [id number]    DOB: 1934-06-25   Date:10/05/2017       Progress Note  Subjective  Chief Complaint  Chief Complaint  Patient presents with  . Rash    approx 4 months- got worse in the past week- started after a flu shot in Oct. Wakes him up itching at night. Red, raised, dry in appearance    Rash  This is a new problem. The current episode started more than 1 month ago. The problem has been waxing and waning since onset. The affected locations include the left upper leg. The rash is characterized by itchiness and burning. Pertinent negatives include no anorexia, congestion, cough, diarrhea, eye pain, facial edema, fatigue, fever, joint pain, nail changes, rhinorrhea, shortness of breath, sore throat or vomiting. Past treatments include nothing.    No problem-specific Assessment & Plan notes found for this encounter.   Past Medical History:  Diagnosis Date  . Allergy    seaasonal  . Anemia   . GERD (gastroesophageal reflux disease)   . Hypertension   . Lichen simplex chronicus     Past Surgical History:  Procedure Laterality Date  . Colles' fracture right 1956    . COLONOSCOPY  2013  . FRACTURE SURGERY    . fractured left arm radius and  ulna 1955    . fractured right tibiofibular 2009    . HAND SURGERY  06/02/11  . HERNIA REPAIR    . herniorpahy, rt  05/09/2009  . NERVE REPAIR Right 08/12/2015   Procedure: EXPLORATION REPAIR  NERVE, DEBRIDEMENT FRACTURE RIGHT INDEX MIDDLE FINGER;  Surgeon: Daryll Brod, MD;  Location: [place];  Service: Orthopedics;  Laterality: Right;  . ruptured left Achilles tendon 1980    . surgery for cubital tunnel syndrome and left arm 1999    . tonsillectomy 1942      Family History  Problem Relation Age of Onset  . Parkinson's disease Mother   . Arthritis Father   . Cancer Brother     Social History   Socioeconomic History  . Marital status: Soil scientist    Spouse name: Not on file  . Number  of children: 3  . Years of education: Not on file  . Highest education level: Bachelor's degree (e.g., BA, AB, BS)  Social Needs  . Financial resource strain: Not hard at all  . Food insecurity - worry: Never true  . Food insecurity - inability: Never true  . Transportation needs - medical: No  . Transportation needs - non-medical: No  Occupational History  . Occupation: Retired  Tobacco Use  . Smoking status: Never Smoker  . Smokeless tobacco: Never Used  . Tobacco comment: smoking cessation materials not required  Substance and Sexual Activity  . Alcohol use: Yes    Alcohol/week: 0.0 oz    Comment: rare; 1 to 2 beers a month  . Drug use: No  . Sexual activity: Not Currently  Other Topics Concern  . Not on file  Social History Narrative  . Not on file    No Known Allergies  Outpatient Medications Prior to Visit  Medication Sig Dispense Refill  . amLODipine (NORVASC) 10 MG tablet Take 1 tablet by mouth daily. Dr [name]    . aspirin 81 MG tablet Take 81 mg by mouth daily. Reported on 07/28/2015    . Multiple Vitamin (MULTIVITAMIN WITH MINERALS) TABS tablet Take 1 tablet by mouth daily.    . Omega-3 1000  MG CAPS Take 1 capsule by mouth 3 (three) times a week.    Marland Kitchen omeprazole (PRILOSEC) 20 MG capsule TAKE ONE CAPSULE BY MOUTH ONCE DAILY 90 capsule 0  . Pediatric Multivitamins-Iron (FLINTSTONES PLUS IRON PO) Take 1 tablet by mouth daily.    . fluticasone (FLONASE) 50 MCG/ACT nasal spray Place 2 sprays into both nostrils daily. (Patient not taking: Reported on 10/05/2017) 16 g 6   No facility-administered medications prior to visit.     Review of Systems  Constitutional: Negative for chills, fatigue, fever, malaise/fatigue and weight loss.  HENT: Negative for congestion, ear discharge, ear pain, rhinorrhea and sore throat.   Eyes: Negative for blurred vision and pain.  Respiratory: Negative for cough, sputum production, shortness of breath and wheezing.   Cardiovascular:  Negative for chest pain, palpitations and leg swelling.  Gastrointestinal: Negative for abdominal pain, anorexia, blood in stool, constipation, diarrhea, heartburn, melena, nausea and vomiting.  Genitourinary: Negative for dysuria, frequency, hematuria and urgency.  Musculoskeletal: Negative for back pain, joint pain, myalgias and neck pain.  Skin: Positive for rash. Negative for nail changes.  Neurological: Negative for dizziness, tingling, sensory change, focal weakness and headaches.  Endo/Heme/Allergies: Negative for environmental allergies and polydipsia. Does not bruise/bleed easily.  Psychiatric/Behavioral: Negative for depression and suicidal ideas. The patient is not nervous/anxious and does not have insomnia.      Objective  Vitals:   10/05/17 1126  BP: 138/80  Pulse: 64  Weight: 186 lb (84.4 kg)  Height: 5\' 9"  (1.753 m)    Physical Exam  Constitutional: He is oriented to person, place, and time and well-developed, well-nourished, and in no distress.  HENT:  Head: Normocephalic.  Right Ear: External ear normal.  Left Ear: External ear normal.  Nose: Nose normal.  Mouth/Throat: Oropharynx is clear and moist.  Eyes: Conjunctivae and EOM are normal. Pupils are equal, round, and reactive to light. Right eye exhibits no discharge. Left eye exhibits no discharge. No scleral icterus.  Neck: Normal range of motion. Neck supple. No JVD present. No tracheal deviation present. No thyromegaly present.  Cardiovascular: Normal rate, regular rhythm, normal heart sounds and intact distal pulses. Exam reveals no gallop and no friction rub.  No murmur heard. Pulmonary/Chest: Breath sounds normal. No respiratory distress. He has no wheezes. He has no rales.  Abdominal: Soft. Bowel sounds are normal. He exhibits no mass. There is no hepatosplenomegaly. There is no tenderness. There is no rebound, no guarding and no CVA tenderness.  Musculoskeletal: Normal range of motion. He exhibits no  edema or tenderness.  Lymphadenopathy:    He has no cervical adenopathy.  Neurological: He is alert and oriented to person, place, and time. He has normal sensation, normal strength, normal reflexes and intact cranial nerves. No cranial nerve deficit.  Skin: Skin is warm. Rash noted.  Psychiatric: Mood and affect normal.  Nursing note and vitals reviewed.     Assessment & Plan  Problem List Items Addressed This Visit    None    Visit Diagnoses    Intrinsic eczema    -  Primary   allergra/benedryl hs/eczema crem   Relevant Medications   triamcinolone cream (KENALOG) 0.1 %      Meds ordered this encounter  Medications  . triamcinolone cream (KENALOG) 0.1 %    Sig: Apply 1 application topically 2 (two) times daily.    Dispense:  453.6 g    Refill:  1      Dr. Otilio Miu [facility]  10/05/17

## 2017-10-05 NOTE — Patient Instructions (Signed)

## 2018-01-08 DIAGNOSIS — L309 Dermatitis, unspecified: Secondary | ICD-10-CM | POA: Diagnosis not present

## 2018-01-08 DIAGNOSIS — L280 Lichen simplex chronicus: Secondary | ICD-10-CM | POA: Diagnosis not present

## 2018-01-09 ENCOUNTER — Encounter: Payer: Self-pay | Admitting: Family Medicine

## 2018-01-09 ENCOUNTER — Ambulatory Visit (INDEPENDENT_AMBULATORY_CARE_PROVIDER_SITE_OTHER): Payer: Medicare HMO | Admitting: Family Medicine

## 2018-01-09 VITALS — BP 130/70 | HR 52 | Ht 69.0 in | Wt 185.0 lb

## 2018-01-09 DIAGNOSIS — I10 Essential (primary) hypertension: Secondary | ICD-10-CM | POA: Insufficient documentation

## 2018-01-09 DIAGNOSIS — E785 Hyperlipidemia, unspecified: Secondary | ICD-10-CM | POA: Diagnosis not present

## 2018-01-09 DIAGNOSIS — K219 Gastro-esophageal reflux disease without esophagitis: Secondary | ICD-10-CM | POA: Diagnosis not present

## 2018-01-09 MED ORDER — AMLODIPINE BESYLATE 10 MG PO TABS: 10.0000 mg | ORAL_TABLET | Freq: Every day | ORAL | 1 refills | Status: DC

## 2018-01-09 MED ORDER — OMEPRAZOLE 20 MG PO CPDR: DELAYED_RELEASE_CAPSULE | ORAL | 3 refills | Status: DC

## 2018-01-09 NOTE — Assessment & Plan Note (Signed)
Controlled on amlodipine 10 mg q day and will check renal panel.

## 2018-01-09 NOTE — Assessment & Plan Note (Signed)
Controlled on omeprazole 20 mg q day .

## 2018-01-09 NOTE — Progress Notes (Signed)
Name: Marco Sims   MRN: [id number]    DOB: [birth year]   Date:01/09/2018       Progress Note  Subjective  Chief Complaint  Chief Complaint  Patient presents with  . Gastroesophageal Reflux  . Hypertension    missed appt with Dr [name] out of med    Gastroesophageal Reflux  He reports no abdominal pain, no belching, no chest pain, no choking, no coughing, no dysphagia, no early satiety, no globus sensation, no heartburn, no hoarse voice, no nausea, no sore throat, no stridor, no tooth decay, no water brash or no wheezing. This is a chronic problem. The current episode started more than 1 year ago. The problem occurs rarely. The problem has been unchanged. The symptoms are aggravated by certain foods. Pertinent negatives include no anemia, fatigue, melena, muscle weakness, orthopnea or weight loss. He has tried a PPI for the symptoms. The treatment provided moderate relief. Past procedures do not include an abdominal ultrasound, an EGD, esophageal manometry, esophageal pH monitoring, H. pylori antibody titer or a UGI.  Hypertension  This is a chronic problem. The current episode started more than 1 year ago. The problem is unchanged. The problem is controlled. Pertinent negatives include no anxiety, blurred vision, chest pain, headaches, malaise/fatigue, neck pain, orthopnea, palpitations, peripheral edema, PND, shortness of breath or sweats. There are no associated agents to hypertension. Past treatments include calcium channel blockers. The current treatment provides moderate improvement. There are no compliance problems.  There is no history of angina, kidney disease, CAD/MI, CVA, heart failure, left ventricular hypertrophy, PVD or retinopathy. There is no history of chronic renal disease, a hypertension causing med or renovascular disease.    Essential hypertension Controlled on amlodipine 10 mg q day and will check renal panel.  GERD Controlled on omeprazole 20 mg q day .   Past  Medical History:  Diagnosis Date  . Allergy    seaasonal  . Anemia   . GERD (gastroesophageal reflux disease)   . Hypertension   . Lichen simplex chronicus     Past Surgical History:  Procedure Laterality Date  . Colles' fracture right 1956    . COLONOSCOPY  2013  . FRACTURE SURGERY    . fractured left arm radius and  ulna 1955    . fractured right tibiofibular 2009    . HAND SURGERY  06/02/11  . HERNIA REPAIR    . herniorpahy, rt  05/09/2009  . NERVE REPAIR Right 08/12/2015   Procedure: EXPLORATION REPAIR  NERVE, DEBRIDEMENT FRACTURE RIGHT INDEX MIDDLE FINGER;  Surgeon: Daryll Brod, MD;  Location: [facility];  Service: Orthopedics;  Laterality: Right;  . ruptured left Achilles tendon 1980    . surgery for cubital tunnel syndrome and left arm 1999    . tonsillectomy 1942      Family History  Problem Relation Age of Onset  . Parkinson's disease Mother   . Arthritis Father   . Cancer Brother     Social History   Socioeconomic History  . Marital status: Soil scientist    Spouse name: Not on file  . Number of children: 3  . Years of education: Not on file  . Highest education level: Bachelor's degree (e.g., BA, AB, BS)  Occupational History  . Occupation: Retired  Scientific laboratory technician  . Financial resource strain: Not hard at all  . Food insecurity:    Worry: Never true    Inability: Never true  . Transportation needs:    Medical:  No    Non-medical: No  Tobacco Use  . Smoking status: Never Smoker  . Smokeless tobacco: Never Used  . Tobacco comment: smoking cessation materials not required  Substance and Sexual Activity  . Alcohol use: Yes    Alcohol/week: 0.0 oz    Comment: rare; 1 to 2 beers a month  . Drug use: No  . Sexual activity: Not Currently  Lifestyle  . Physical activity:    Days per week: 0 days    Minutes per session: 0 min  . Stress: Not at all  Relationships  . Social connections:    Talks on phone: Patient refused    Gets  together: Patient refused    Attends religious service: Patient refused    Active member of club or organization: Patient refused    Attends meetings of clubs or organizations: Patient refused    Relationship status: Patient refused  . Intimate partner violence:    Fear of current or ex partner: No    Emotionally abused: No    Physically abused: No    Forced sexual activity: No  Other Topics Concern  . Not on file  Social History Narrative  . Not on file    No Known Allergies  Outpatient Medications Prior to Visit  Medication Sig Dispense Refill  . aspirin 81 MG tablet Take 81 mg by mouth daily. Reported on 07/28/2015    . cefdinir (OMNICEF) 300 MG capsule Take 300 mg by mouth 2 (two) times daily. derm  0  . hydrOXYzine (ATARAX/VISTARIL) 10 MG tablet Take 10 mg by mouth at bedtime. derm  0  . Multiple Vitamin (MULTIVITAMIN WITH MINERALS) TABS tablet Take 1 tablet by mouth daily.    . Pediatric Multivitamins-Iron (FLINTSTONES PLUS IRON PO) Take 1 tablet by mouth daily.    Marland Kitchen triamcinolone cream (KENALOG) 0.1 % Apply 1 application topically 2 (two) times daily. 453.6 g 1  . amLODipine (NORVASC) 10 MG tablet Take 1 tablet by mouth daily. Dr [name]    . omeprazole (PRILOSEC) 20 MG capsule TAKE ONE CAPSULE BY MOUTH ONCE DAILY 90 capsule 0  . fluticasone (FLONASE) 50 MCG/ACT nasal spray Place 2 sprays into both nostrils daily. (Patient not taking: Reported on 10/05/2017) 16 g 6  . Omega-3 1000 MG CAPS Take 1 capsule by mouth 3 (three) times a week.     No facility-administered medications prior to visit.     Review of Systems  Constitutional: Negative for chills, fatigue, fever, malaise/fatigue and weight loss.  HENT: Negative for ear discharge, ear pain, hoarse voice and sore throat.   Eyes: Negative for blurred vision.  Respiratory: Negative for cough, sputum production, choking, shortness of breath and wheezing.   Cardiovascular: Negative for chest pain, palpitations, orthopnea,  leg swelling and PND.  Gastrointestinal: Negative for abdominal pain, blood in stool, constipation, diarrhea, dysphagia, heartburn, melena and nausea.  Genitourinary: Negative for dysuria, frequency, hematuria and urgency.  Musculoskeletal: Negative for back pain, joint pain, myalgias, muscle weakness and neck pain.  Skin: Negative for rash.  Neurological: Negative for dizziness, tingling, sensory change, focal weakness and headaches.  Endo/Heme/Allergies: Negative for environmental allergies and polydipsia. Does not bruise/bleed easily.  Psychiatric/Behavioral: Negative for depression and suicidal ideas. The patient is not nervous/anxious and does not have insomnia.      Objective  Vitals:   01/09/18 1013  BP: 130/70  Pulse: (!) 52  Weight: 185 lb (83.9 kg)  Height: 5\' 9"  (1.753 m)    Physical Exam  Constitutional: He is oriented to person, place, and time.  HENT:  Head: Normocephalic.  Right Ear: External ear normal.  Left Ear: External ear normal.  Nose: Nose normal.  Mouth/Throat: Oropharynx is clear and moist.  Eyes: Pupils are equal, round, and reactive to light. Conjunctivae and EOM are normal. Right eye exhibits no discharge. Left eye exhibits no discharge. No scleral icterus.  Neck: Normal range of motion. Neck supple. No JVD present. No tracheal deviation present. No thyromegaly present.  Cardiovascular: Normal rate, regular rhythm, normal heart sounds and intact distal pulses. Exam reveals no gallop and no friction rub.  No murmur heard. Pulmonary/Chest: Breath sounds normal. No respiratory distress. He has no wheezes. He has no rales.  Abdominal: Soft. Bowel sounds are normal. He exhibits no mass. There is no hepatosplenomegaly. There is no tenderness. There is no rebound, no guarding and no CVA tenderness.  Musculoskeletal: Normal range of motion. He exhibits no edema or tenderness.  Lymphadenopathy:    He has no cervical adenopathy.  Neurological: He is alert and  oriented to person, place, and time. He has normal strength and normal reflexes. No cranial nerve deficit.  Skin: Skin is warm. No rash noted.  Nursing note and vitals reviewed.     Assessment & Plan  Problem List Items Addressed This Visit      Cardiovascular and Mediastinum   Essential hypertension - Primary    Controlled on amlodipine 10 mg q day and will check renal panel.      Relevant Medications   amLODipine (NORVASC) 10 MG tablet   Other Relevant Orders   Renal Function Panel     Digestive   GERD    Controlled on omeprazole 20 mg q day .      Relevant Medications   omeprazole (PRILOSEC) 20 MG capsule    Other Visit Diagnoses    Dyslipidemia       Hx of hyperlipdemia will check lipid panel.   Relevant Orders   Lipid panel      Meds ordered this encounter  Medications  . amLODipine (NORVASC) 10 MG tablet    Sig: Take 1 tablet (10 mg total) by mouth daily. Dr [name]    Dispense:  90 tablet    Refill:  1  . omeprazole (PRILOSEC) 20 MG capsule    Sig: TAKE ONE CAPSULE BY MOUTH ONCE DAILY    Dispense:  90 capsule    Refill:  3      Dr. Jean Skow [facility]  01/09/18

## 2018-01-10 ENCOUNTER — Ambulatory Visit: Payer: Self-pay | Admitting: Family Medicine

## 2018-01-10 LAB — RENAL FUNCTION PANEL
Albumin: 4.4 g/dL (ref 3.5–4.7)
BUN / CREAT RATIO: 13 (ref 10–24)
BUN: 12 mg/dL (ref 8–27)
CALCIUM: 9.4 mg/dL (ref 8.6–10.2)
CHLORIDE: 106 mmol/L (ref 96–106)
CO2: 24 mmol/L (ref 20–29)
CREATININE: 0.89 mg/dL (ref 0.76–1.27)
GFR calc Af Amer: 93 mL/min/{1.73_m2} (ref >59)
GFR calc non Af Amer: 80 mL/min/{1.73_m2} (ref >59)
Glucose: 89 mg/dL (ref 65–99)
PHOSPHORUS: 2.7 mg/dL (ref 2.5–4.5)
Potassium: 4.7 mmol/L (ref 3.5–5.2)
SODIUM: 142 mmol/L (ref 134–144)

## 2018-01-10 LAB — LIPID PANEL
Chol/HDL Ratio: 5.6 ratio — ABNORMAL HIGH (ref 0.0–5.0)
Cholesterol, Total: 257 mg/dL — ABNORMAL HIGH (ref 100–199)
HDL: 46 mg/dL (ref >39)
LDL CALC: 174 mg/dL — AB (ref 0–99)
TRIGLYCERIDES: 186 mg/dL — AB (ref 0–149)
VLDL CHOLESTEROL CAL: 37 mg/dL (ref 5–40)

## 2018-01-10 MED ORDER — OMEPRAZOLE 20 MG PO CPDR: 20.0000 mg | DELAYED_RELEASE_CAPSULE | Freq: Every day | ORAL | 3 refills | Status: DC

## 2018-01-10 NOTE — Addendum Note (Signed)
Addended by: [name] on: 01/10/2018 08:36 AM   Modules accepted: Orders

## 2018-02-11 DIAGNOSIS — L280 Lichen simplex chronicus: Secondary | ICD-10-CM | POA: Diagnosis not present

## 2018-05-01 ENCOUNTER — Ambulatory Visit (INDEPENDENT_AMBULATORY_CARE_PROVIDER_SITE_OTHER): Payer: Medicare HMO | Admitting: Family Medicine

## 2018-05-01 ENCOUNTER — Encounter: Payer: Self-pay | Admitting: Family Medicine

## 2018-05-01 VITALS — BP 138/62 | HR 64 | Ht 69.0 in | Wt 186.0 lb

## 2018-05-01 DIAGNOSIS — Z23 Encounter for immunization: Secondary | ICD-10-CM

## 2018-05-01 DIAGNOSIS — K921 Melena: Secondary | ICD-10-CM | POA: Diagnosis not present

## 2018-05-01 DIAGNOSIS — R1031 Right lower quadrant pain: Secondary | ICD-10-CM | POA: Diagnosis not present

## 2018-05-01 DIAGNOSIS — K219 Gastro-esophageal reflux disease without esophagitis: Secondary | ICD-10-CM | POA: Diagnosis not present

## 2018-05-01 DIAGNOSIS — I10 Essential (primary) hypertension: Secondary | ICD-10-CM | POA: Diagnosis not present

## 2018-05-01 MED ORDER — OMEPRAZOLE 20 MG PO CPDR: DELAYED_RELEASE_CAPSULE | ORAL | 3 refills | Status: DC

## 2018-05-01 NOTE — Progress Notes (Signed)
Date:  05/01/2018   Name:  Marco Sims   DOB:  [birth year]   MRN:  [id number]   Chief Complaint: Gastroesophageal Reflux and Hypertension (followed by cardiology- has upcoming appt) Gastroesophageal Reflux  He reports no abdominal pain, no belching, no chest pain, no choking, no coughing, no dysphagia, no early satiety, no globus sensation, no heartburn, no hoarse voice, no nausea, no sore throat, no stridor, no tooth decay, no water brash or no wheezing. This is a chronic problem. The current episode started more than 1 year ago. The problem occurs occasionally. The problem has been gradually improving. The symptoms are aggravated by certain foods. Pertinent negatives include no anemia, fatigue, melena, muscle weakness, orthopnea or weight loss. There are no known risk factors. He has tried a PPI for the symptoms. The treatment provided moderate relief. Past procedures do not include an abdominal ultrasound, an EGD, esophageal manometry, esophageal pH monitoring, H. pylori antibody titer or a UGI.  Hypertension  This is a chronic problem. The current episode started more than 1 year ago. The problem has been gradually improving since onset. The problem is controlled. Pertinent negatives include no anxiety, blurred vision, chest pain, headaches, malaise/fatigue, neck pain, orthopnea, palpitations, peripheral edema, PND, shortness of breath or sweats. There are no associated agents to hypertension. There are no known risk factors for coronary artery disease. Past treatments include calcium channel blockers. The current treatment provides moderate improvement. There are no compliance problems.  There is no history of angina, kidney disease, CAD/MI, CVA, heart failure, left ventricular hypertrophy, PVD or retinopathy. There is no history of chronic renal disease, a hypertension causing med or renovascular disease.  Abdominal Pain  This is a new problem. The current episode started more than 1 month ago.  The onset quality is sudden. The problem occurs daily. The problem has been waxing and waning. The pain is located in the RLQ. The pain is mild. The quality of the pain is aching. The abdominal pain does not radiate. Associated symptoms include hematochezia. Pertinent negatives include no belching, constipation, diarrhea, dysuria, fever, frequency, headaches, hematuria, melena, myalgias, nausea or weight loss. Nothing aggravates the pain. The pain is relieved by nothing. He has tried proton pump inhibitors for the symptoms. His past medical history is significant for GERD.     Review of Systems  Constitutional: Negative for chills, fatigue, fever, malaise/fatigue and weight loss.  HENT: Negative for drooling, ear discharge, ear pain, hoarse voice, postnasal drip, rhinorrhea, sinus pain and sore throat.   Eyes: Negative for blurred vision and discharge.  Respiratory: Negative for cough, choking, shortness of breath and wheezing.   Cardiovascular: Negative for chest pain, palpitations, orthopnea, leg swelling and PND.  Gastrointestinal: Positive for hematochezia. Negative for abdominal pain, blood in stool, constipation, diarrhea, dysphagia, heartburn, melena and nausea.  Endocrine: Negative for polydipsia.  Genitourinary: Negative for dysuria, frequency, hematuria and urgency.  Musculoskeletal: Negative for back pain, myalgias, muscle weakness and neck pain.  Skin: Negative for rash.  Allergic/Immunologic: Negative for environmental allergies.  Neurological: Negative for dizziness and headaches.  Hematological: Does not bruise/bleed easily.  Psychiatric/Behavioral: Negative for suicidal ideas. The patient is not nervous/anxious.     Patient Active Problem List   Diagnosis Date Noted  . Essential hypertension 01/09/2018  . GERD 11/04/2009  . ERECTILE DYSFUNCTION 07/28/2008  . DERMATITIS 07/28/2008  . LABYRINTHITIS 11/05/2007  . HYPERLIPIDEMIA 09/24/2007  . ANEMIA 09/24/2007  . CONDUCTIVE  HEARING LOSS UNILATERAL 09/24/2007  . ARTHRITIS 09/24/2007  .  UNS ADVRS EFF OTH RX MEDICINAL&BIOLOGICAL SBSTNC 09/24/2007    No Known Allergies  Past Surgical History:  Procedure Laterality Date  . Colles' fracture right 1956    . COLONOSCOPY  2013  . FRACTURE SURGERY    . fractured left arm radius and  ulna 1955    . fractured right tibiofibular 2009    . HAND SURGERY  06/02/11  . HERNIA REPAIR    . herniorpahy, rt  05/09/2009  . NERVE REPAIR Right 08/12/2015   Procedure: EXPLORATION REPAIR  NERVE, DEBRIDEMENT FRACTURE RIGHT INDEX MIDDLE FINGER;  Surgeon: Daryll Brod, MD;  Location: [facility];  Service: Orthopedics;  Laterality: Right;  . ruptured left Achilles tendon 1980    . surgery for cubital tunnel syndrome and left arm 1999    . tonsillectomy 1942      Social History   Tobacco Use  . Smoking status: Never Smoker  . Smokeless tobacco: Never Used  . Tobacco comment: smoking cessation materials not required  Substance Use Topics  . Alcohol use: Yes    Alcohol/week: 0.0 standard drinks    Comment: rare; 1 to 2 beers a month  . Drug use: No     Medication list has been reviewed and updated.  Current Meds  Medication Sig  . amLODipine (NORVASC) 10 MG tablet Take 1 tablet (10 mg total) by mouth daily. Dr [name]  . aspirin 81 MG tablet Take 81 mg by mouth daily. Reported on 07/28/2015  . cefdinir (OMNICEF) 300 MG capsule Take 300 mg by mouth 2 (two) times daily. derm  . hydrOXYzine (ATARAX/VISTARIL) 10 MG tablet Take 10 mg by mouth at bedtime. derm  . Multiple Vitamin (MULTIVITAMIN WITH MINERALS) TABS tablet Take 1 tablet by mouth daily.  Marland Kitchen omeprazole (PRILOSEC) 20 MG capsule TAKE ONE CAPSULE BY MOUTH ONCE DAILY  . Pediatric Multivitamins-Iron (FLINTSTONES PLUS IRON PO) Take 1 tablet by mouth daily.  Marland Kitchen triamcinolone cream (KENALOG) 0.1 % Apply 1 application topically 2 (two) times daily.  . [DISCONTINUED] omeprazole (PRILOSEC) 20 MG capsule TAKE ONE  CAPSULE BY MOUTH ONCE DAILY    PHQ 2/9 Scores 01/09/2018 08/30/2017 06/13/2016 02/18/2015  PHQ - 2 Score 0 0 0 0  PHQ- 9 Score 0 - - -    Physical Exam  Constitutional: He is oriented to person, place, and time.  HENT:  Head: Normocephalic.  Right Ear: External ear normal.  Left Ear: External ear normal.  Nose: Nose normal.  Mouth/Throat: Oropharynx is clear and moist.  Eyes: Pupils are equal, round, and reactive to light. Conjunctivae and EOM are normal. Right eye exhibits no discharge. Left eye exhibits no discharge. No scleral icterus.  Neck: Normal range of motion. Neck supple. No JVD present. No tracheal deviation present. No thyromegaly present.  Cardiovascular: Normal rate, regular rhythm, normal heart sounds and intact distal pulses. Exam reveals no gallop and no friction rub.  No murmur heard. Pulmonary/Chest: Breath sounds normal. No respiratory distress. He has no wheezes. He has no rales.  Abdominal: Soft. Bowel sounds are normal. He exhibits no mass. There is no hepatosplenomegaly. There is tenderness in the right lower quadrant. There is no rigidity, no rebound, no guarding and no CVA tenderness.  Musculoskeletal: Normal range of motion. He exhibits no edema or tenderness.  Lymphadenopathy:    He has no cervical adenopathy.  Neurological: He is alert and oriented to person, place, and time. He has normal strength and normal reflexes. No cranial nerve deficit.  Skin: Skin  is warm. No rash noted.  Nursing note and vitals reviewed.   BP 138/62   Pulse 64   Ht 5\' 9"  (1.753 m)   Wt 186 lb (84.4 kg)   BMI 27.47 kg/m   Assessment and Plan: 1. Gastroesophageal reflux disease, esophagitis presence not specified Chronic Controlled Cont omeprazole - omeprazole (PRILOSEC) 20 MG capsule; TAKE ONE CAPSULE BY MOUTH ONCE DAILY  Dispense: 90 capsule; Refill: 3  2. Essential hypertension Followed by Dr [name] There are no diagnoses linked to this encounter.  3. Right lower  quadrant abdominal pain Onset 1 month ago with hematochezia. Scheduled to see Dr. Rosana Hoes tomorrow at 9:00 for possible CT scan. - CBC with Differential/Platelet - Ambulatory referral to Gastroenterology  4. Hematochezia Noted small episode with one specific larger amount about 1 week ago. - CBC with Differential/Platelet - Ambulatory referral to Gastroenterology  5. Flu vaccine need Discussed and administered. - Flu vaccine HIGH DOSE PF  Dr. Otilio Miu [facility]  05/01/2018

## 2018-05-02 ENCOUNTER — Ambulatory Visit (INDEPENDENT_AMBULATORY_CARE_PROVIDER_SITE_OTHER): Payer: Medicare HMO | Admitting: Surgery

## 2018-05-02 ENCOUNTER — Encounter: Payer: Self-pay | Admitting: Surgery

## 2018-05-02 VITALS — BP 163/75 | HR 60 | Temp 97.2°F | Resp 96 | Ht 69.0 in | Wt 184.8 lb

## 2018-05-02 DIAGNOSIS — K921 Melena: Secondary | ICD-10-CM

## 2018-05-02 DIAGNOSIS — R1031 Right lower quadrant pain: Secondary | ICD-10-CM

## 2018-05-02 LAB — CBC WITH DIFFERENTIAL/PLATELET
BASOS ABS: 0 10*3/uL (ref 0.0–0.2)
Basos: 0 %
EOS (ABSOLUTE): 0.3 10*3/uL (ref 0.0–0.4)
Eos: 6 %
HEMOGLOBIN: 14.3 g/dL (ref 13.0–17.7)
Hematocrit: 42.4 % (ref 37.5–51.0)
Immature Grans (Abs): 0 10*3/uL (ref 0.0–0.1)
Immature Granulocytes: 0 %
LYMPHS ABS: 1.1 10*3/uL (ref 0.7–3.1)
Lymphs: 21 %
MCH: 29.3 pg (ref 26.6–33.0)
MCHC: 33.7 g/dL (ref 31.5–35.7)
MCV: 87 fL (ref 79–97)
MONOS ABS: 0.3 10*3/uL (ref 0.1–0.9)
Monocytes: 7 %
Neutrophils Absolute: 3.3 10*3/uL (ref 1.4–7.0)
Neutrophils: 66 %
PLATELETS: 215 10*3/uL (ref 150–450)
RBC: 4.88 x10E6/uL (ref 4.14–5.80)
RDW: 13.2 % (ref 12.3–15.4)
WBC: 5.1 10*3/uL (ref 3.4–10.8)

## 2018-05-02 NOTE — Progress Notes (Signed)
Surgical Clinic History and Physical  Referring provider:  [name], MD [address] [place], [place] [address]  HISTORY OF PRESENT ILLNESS (HPI):  84 y.o. male presents for evaluation of mild/subtle RLQ abdominal "discomfort" x 3 months (specifically denies abdominal pain) that has neither improved nor worsened since it was first noted and does not seem to be associated with eating/drinking or BM's. He has only noticed such "discomfort" in the mornings, before getting out of bed, after which it "disappears" and returns again the next morning. One week ago, patient reports he noticed a large amount of blood in the toilet after a BM. He denies constipation (reports loose BM's) and denies any N/V, fever/chills, weight loss, or peri-anal pain. He was told following his most recent colonoscopy (2015) that he has internal hemorrhoids, but he denies any symptoms, including peri-anal pain, itching, sensation of a peri-anal mass, or awareness otherwise. Patient reports he accordingly presented to his primary care physician to make sure "everything is okay" considering persistence of mild RLQ abdominal "discomfort", adding that his current "discomfort" is different than the Right groin pain he experienced prior to his Right inguinal hernia repair (2015), which resolved after surgical repair, but only following a long painful recovery, following shortly after he underwent repair of his Left inguinal hernia likewise.  PAST MEDICAL HISTORY (PMH):  Past Medical History:  Diagnosis Date  . Allergy    seaasonal  . Anemia   . GERD (gastroesophageal reflux disease)   . Hypertension   . Lichen simplex chronicus      PAST SURGICAL HISTORY ([place]):  Past Surgical History:  Procedure Laterality Date  . Colles' fracture right 1956    . COLONOSCOPY  2013  . FRACTURE SURGERY    . fractured left arm radius and  ulna 1955    . fractured right tibiofibular 2009    . HAND SURGERY  06/02/11  .  HERNIA REPAIR    . herniorpahy, rt  05/09/2009  . NERVE REPAIR Right 08/12/2015   Procedure: EXPLORATION REPAIR  NERVE, DEBRIDEMENT FRACTURE RIGHT INDEX MIDDLE FINGER;  Surgeon: Daryll Brod, MD;  Location: [facility];  Service: Orthopedics;  Laterality: Right;  . ruptured left Achilles tendon 1980    . surgery for cubital tunnel syndrome and left arm 1999    . tonsillectomy 1942       MEDICATIONS:  Prior to Admission medications   Medication Sig Start Date End Date Taking? Authorizing Provider  amLODipine (NORVASC) 10 MG tablet Take 1 tablet (10 mg total) by mouth daily. Dr [name] 01/09/18 10/16/19 Yes [name], MD  aspirin 81 MG tablet Take 81 mg by mouth daily. Reported on 07/28/2015   Yes [provider]  Multiple Vitamin (MULTIVITAMIN WITH MINERALS) TABS tablet Take 1 tablet by mouth daily.   Yes [provider]  mupirocin ointment (BACTROBAN) 2 % APPLY 1 OINTMENT TOPICALLY TWICE DAILY AS NEEDED 02/11/18  Yes [provider]  omeprazole (PRILOSEC) 20 MG capsule TAKE ONE CAPSULE BY MOUTH ONCE DAILY 05/01/18  Yes [name], MD  Pediatric Multivitamins-Iron (FLINTSTONES PLUS IRON PO) Take 1 tablet by mouth daily.   Yes [provider]  triamcinolone cream (KENALOG) 0.1 % Apply 1 application topically 2 (two) times daily. 10/05/17  Yes [name], MD     ALLERGIES:  No Known Allergies   SOCIAL HISTORY:  Social History   Socioeconomic History  . Marital status: Soil scientist    Spouse name:  Not on file  . Number of children: 3  . Years of education: Not on file  . Highest education level: Bachelor's degree (e.g., BA, AB, BS)  Occupational History  . Occupation: Retired  Scientific laboratory technician  . Financial resource strain: Not hard at all  . Food insecurity:    Worry: Never true    Inability: Never true  . Transportation needs:    Medical: No    Non-medical: No  Tobacco Use  . Smoking status: Never Smoker  . Smokeless  tobacco: Never Used  . Tobacco comment: smoking cessation materials not required  Substance and Sexual Activity  . Alcohol use: Yes    Alcohol/week: 0.0 standard drinks    Comment: rare; 1 to 2 beers a month  . Drug use: No  . Sexual activity: Not Currently  Lifestyle  . Physical activity:    Days per week: 0 days    Minutes per session: 0 min  . Stress: Not at all  Relationships  . Social connections:    Talks on phone: Patient refused    Gets together: Patient refused    Attends religious service: Patient refused    Active member of club or organization: Patient refused    Attends meetings of clubs or organizations: Patient refused    Relationship status: Patient refused  . Intimate partner violence:    Fear of current or ex partner: No    Emotionally abused: No    Physically abused: No    Forced sexual activity: No  Other Topics Concern  . Not on file  Social History Narrative  . Not on file    The patient currently resides (home / rehab facility / nursing home): Home The patient normally is (ambulatory / bedbound): Ambulatory  FAMILY HISTORY:  Family History  Problem Relation Age of Onset  . Parkinson's disease Mother   . Arthritis Father   . Cancer Brother     Patient specifically denies family history of colon cancer.  Otherwise negative/non-contributory.  REVIEW OF SYSTEMS:  Constitutional: denies any other weight loss, fever, chills, or sweats  Eyes: denies any other vision changes, history of eye injury  ENT: denies sore throat, hearing problems  Respiratory: denies shortness of breath, wheezing  Cardiovascular: denies chest pain, palpitations  Gastrointestinal: abdominal pain, N/V, and bowel function as per HPI Musculoskeletal: denies any other joint pains or cramps  Skin: Denies any other rashes or skin discolorations Neurological: denies any other headache, dizziness, weakness  Psychiatric: Denies any other depression, anxiety   All other review of  systems were otherwise negative   VITAL SIGNS:  BP (!) 163/75   Pulse 60   Temp (!) 97.2 F (36.2 C) (Temporal)   Resp (!) 96   Ht 5\' 9"  (1.753 m)   Wt 184 lb 12.8 oz (83.8 kg)   BMI 27.29 kg/m    PHYSICAL EXAM:  Constitutional:  -- Normal body habitus  -- Awake, alert, and oriented x3  Eyes:  -- Pupils equally round and reactive to light  -- No scleral icterus  Ear, nose, throat:  -- No jugular venous distension -- No nasal drainage, bleeding Pulmonary:  -- No crackles  -- Equal breath sounds bilaterally -- Breathing non-labored at rest Cardiovascular:  -- S1, S2 present  -- No pericardial rubs  Gastrointestinal:  -- Abdomen soft, nontender, and non-distended with no guarding/rebound tenderness -- No abdominal masses appreciated, pulsatile or otherwise  Musculoskeletal and Integumentary:  -- Wounds or skin discoloration: None appreciated --  Extremities: B/L UE and LE FROM, hands and feet warm, no edema  Neurologic:  -- Motor function: Intact and symmetric -- Sensation: Intact and symmetric  Labs:  CBC Latest Ref Rng & Units 05/01/2018 06/13/2016 11/20/2013  WBC 3.4 - 10.8 x10E3/uL 5.1 4.7 5.7  Hemoglobin 13.0 - 17.7 g/dL 14.3 13.8 11.1(L)  Hematocrit 37.5 - 51.0 % 42.4 39.5 33.3(L)  Platelets 150 - 450 x10E3/uL 215 198 154   CMP Latest Ref Rng & Units 01/09/2018 06/13/2016 11/20/2013  Glucose 65 - 99 mg/dL 89 98 127(H)  BUN 8 - 27 mg/dL 12 18 20(H)  Creatinine 0.76 - 1.27 mg/dL 0.89 0.98 1.07  Sodium 134 - 144 mmol/L 142 146(H) 139  Potassium 3.5 - 5.2 mmol/L 4.7 4.4 3.5  Chloride 96 - 106 mmol/L 106 105 110(H)  CO2 20 - 29 mmol/L 24 24 24   Calcium 8.6 - 10.2 mg/dL 9.4 9.0 8.3(L)  Total Protein 6.0 - 8.3 g/dL - - -  Total Bilirubin 0.3 - 1.2 mg/dL - - -  Alkaline Phos 39 - 117 U/L - - -  AST 0 - 37 U/L - - -  ALT 0 - 53 U/L - - -   Imaging studies: No new pertinent imaging studies available for review    Assessment/Plan:  84 y.o. male with mild persistent  RLQ abdominal discomfort with recent episode of hematochezia without evidence to suggest recurrence of Right inguinal hernia, complicated by co-morbidities including advanced chronological age, HTN, and GERD.   - continue adequate hydration and heart-healthy high-fiber diet  - can hold off CT for now, considering mild symptoms without worsening or signs/symptoms of systemic illness and colonoscopy within past 5 years  - return to clinic following GI appointment (scheduled) and anticipated follow-up colonoscopy if performed  - instructed to call if any questions or concerns  All of the above recommendations were discussed with the patient and patient's family, and all of patient's and family's questions were answered to their expressed satisfaction.  Thank you for the opportunity to participate in this patient's care.  -- Marilynne Drivers Rosana Hoes, MD, [place] Partnering for exceptional care. Office: [phone]

## 2018-05-02 NOTE — Patient Instructions (Addendum)
Please be sure to follow up with Dr.Wohl 06/12/18 at 10:00 am.   Please call our office to schedule an appointment after you see Dr.Wohl.

## 2018-05-06 ENCOUNTER — Telehealth: Payer: Self-pay

## 2018-05-06 NOTE — Telephone Encounter (Signed)
Sent to [employer] for [employer]

## 2018-05-13 ENCOUNTER — Ambulatory Visit (INDEPENDENT_AMBULATORY_CARE_PROVIDER_SITE_OTHER): Payer: Medicare HMO | Admitting: Family Medicine

## 2018-05-13 ENCOUNTER — Encounter: Payer: Self-pay | Admitting: Family Medicine

## 2018-05-13 VITALS — BP 134/60 | HR 64 | Ht 69.0 in | Wt 184.0 lb

## 2018-05-13 DIAGNOSIS — H6123 Impacted cerumen, bilateral: Secondary | ICD-10-CM | POA: Diagnosis not present

## 2018-05-13 DIAGNOSIS — H60501 Unspecified acute noninfective otitis externa, right ear: Secondary | ICD-10-CM | POA: Diagnosis not present

## 2018-05-13 MED ORDER — CARBAMIDE PEROXIDE 6.5 % OT SOLN: 5.0000 [drp] | Freq: Two times a day (BID) | OTIC | 0 refills | Status: DC

## 2018-05-13 MED ORDER — AMOXICILLIN 500 MG PO CAPS: 500.0000 mg | ORAL_CAPSULE | Freq: Three times a day (TID) | ORAL | 0 refills | Status: DC

## 2018-05-13 NOTE — Progress Notes (Signed)
Date:  05/13/2018   Name:  Marco Sims   DOB:  08-Sep-1933   MRN:  [id number]   Chief Complaint: Ear Pain (R) ear hurting/ can't wera hearing aides) Otalgia   There is pain in the right ear. This is a new problem. The current episode started in the past 7 days (Friday morning). The problem occurs hourly. The problem has been waxing and waning. There has been no fever. The fever has been present for 3 to 4 days. The pain is at a severity of 7/10 (at worst Friday night). The pain is moderate. Pertinent negatives include no abdominal pain, coughing, diarrhea, ear discharge, headaches, hearing loss, neck pain, rash, rhinorrhea, sore throat or vomiting. He has tried ear drops for the symptoms. The treatment provided mild relief.     Review of Systems  Constitutional: Negative for chills and fever.  HENT: Positive for ear pain. Negative for drooling, ear discharge, hearing loss, rhinorrhea and sore throat.   Respiratory: Negative for cough, shortness of breath and wheezing.   Cardiovascular: Negative for chest pain, palpitations and leg swelling.  Gastrointestinal: Negative for abdominal pain, blood in stool, constipation, diarrhea, nausea and vomiting.  Endocrine: Negative for polydipsia.  Genitourinary: Negative for dysuria, frequency, hematuria and urgency.  Musculoskeletal: Negative for back pain, myalgias and neck pain.  Skin: Negative for rash.  Allergic/Immunologic: Negative for environmental allergies.  Neurological: Negative for dizziness and headaches.  Hematological: Does not bruise/bleed easily.  Psychiatric/Behavioral: Negative for suicidal ideas. The patient is not nervous/anxious.     Patient Active Problem List   Diagnosis Date Noted  . Essential hypertension 01/09/2018  . GERD 11/04/2009  . ERECTILE DYSFUNCTION 07/28/2008  . DERMATITIS 07/28/2008  . LABYRINTHITIS 11/05/2007  . HYPERLIPIDEMIA 09/24/2007  . ANEMIA 09/24/2007  . CONDUCTIVE HEARING LOSS UNILATERAL  09/24/2007  . ARTHRITIS 09/24/2007  . UNS ADVRS EFF OTH RX MEDICINAL&BIOLOGICAL SBSTNC 09/24/2007    No Known Allergies  Past Surgical History:  Procedure Laterality Date  . Colles' fracture right 1956    . COLONOSCOPY  2013  . FRACTURE SURGERY    . fractured left arm radius and  ulna 1955    . fractured right tibiofibular 2009    . HAND SURGERY  06/02/11  . HERNIA REPAIR    . herniorpahy, rt  05/09/2009  . NERVE REPAIR Right 08/12/2015   Procedure: EXPLORATION REPAIR  NERVE, DEBRIDEMENT FRACTURE RIGHT INDEX MIDDLE FINGER;  Surgeon: Daryll Brod, MD;  Location: [facility];  Service: Orthopedics;  Laterality: Right;  . ruptured left Achilles tendon 1980    . surgery for cubital tunnel syndrome and left arm 1999    . tonsillectomy 1942      Social History   Tobacco Use  . Smoking status: Never Smoker  . Smokeless tobacco: Never Used  . Tobacco comment: smoking cessation materials not required  Substance Use Topics  . Alcohol use: Yes    Alcohol/week: 0.0 standard drinks    Comment: rare; 1 to 2 beers a month  . Drug use: No     Medication list has been reviewed and updated.  Current Meds  Medication Sig  . amLODipine (NORVASC) 10 MG tablet Take 1 tablet (10 mg total) by mouth daily. Dr [name]  . aspirin 81 MG tablet Take 81 mg by mouth daily. Reported on 07/28/2015  . Multiple Vitamin (MULTIVITAMIN WITH MINERALS) TABS tablet Take 1 tablet by mouth daily.  . mupirocin ointment (BACTROBAN) 2 % APPLY 1 OINTMENT  TOPICALLY TWICE DAILY AS NEEDED  . omeprazole (PRILOSEC) 20 MG capsule TAKE ONE CAPSULE BY MOUTH ONCE DAILY  . Pediatric Multivitamins-Iron (FLINTSTONES PLUS IRON PO) Take 1 tablet by mouth daily.  Marland Kitchen triamcinolone cream (KENALOG) 0.1 % Apply 1 application topically 2 (two) times daily.    PHQ 2/9 Scores 01/09/2018 08/30/2017 06/13/2016 02/18/2015  PHQ - 2 Score 0 0 0 0  PHQ- 9 Score 0 - - -    Physical Exam  Constitutional: He is oriented to person,  place, and time. He appears well-developed and well-nourished.  HENT:  Head: Normocephalic.  Right Ear: External ear normal. There is swelling. Decreased hearing is noted.  Left Ear: External ear normal. There is swelling. Decreased hearing is noted.  Nose: Nose normal.  Mouth/Throat: Oropharynx is clear and moist.  Cerumen impaction  Eyes: Pupils are equal, round, and reactive to light. Conjunctivae and EOM are normal. Right eye exhibits no discharge. Left eye exhibits no discharge. No scleral icterus.  Neck: Normal range of motion. Neck supple. No JVD present. No tracheal deviation present. No thyromegaly present.  Cardiovascular: Normal rate, regular rhythm, normal heart sounds and intact distal pulses. Exam reveals no gallop and no friction rub.  No murmur heard. Pulmonary/Chest: Breath sounds normal. No respiratory distress. He has no wheezes. He has no rales.  Abdominal: Soft. Bowel sounds are normal. He exhibits no mass. There is no hepatosplenomegaly. There is no tenderness. There is no rebound, no guarding and no CVA tenderness.  Musculoskeletal: Normal range of motion. He exhibits no edema or tenderness.  Lymphadenopathy:    He has no cervical adenopathy.  Neurological: He is alert and oriented to person, place, and time. He has normal strength and normal reflexes. No cranial nerve deficit.  Skin: Skin is warm. No rash noted.  Nursing note and vitals reviewed.   BP 134/60   Pulse 64   Ht 5\' 9"  (1.753 m)   Wt 184 lb (83.5 kg)   BMI 27.17 kg/m   Assessment and Plan:  1. Acute otitis externa of right ear, unspecified type Prescribed Amoxil - amoxicillin (AMOXIL) 500 MG capsule; Take 1 capsule (500 mg total) by mouth 3 (three) times daily.  Dispense: 30 capsule; Refill: 0  2. Bilateral impacted cerumen Sent in [place] ear drops/ refer to ENT after unsuccessful attempt at washing ear out - carbamide peroxide (DEBROX) 6.5 % OTIC solution; Place 5 drops into both ears 2 (two)  times daily.  Dispense: 15 mL; Refill: 0 - Ambulatory referral to ENT   Dr. Otilio Miu [facility]  05/13/2018

## 2018-05-27 DIAGNOSIS — H9209 Otalgia, unspecified ear: Secondary | ICD-10-CM | POA: Diagnosis not present

## 2018-05-27 DIAGNOSIS — H6123 Impacted cerumen, bilateral: Secondary | ICD-10-CM | POA: Diagnosis not present

## 2018-05-27 DIAGNOSIS — H60339 Swimmer's ear, unspecified ear: Secondary | ICD-10-CM | POA: Diagnosis not present

## 2018-06-10 DIAGNOSIS — H60339 Swimmer's ear, unspecified ear: Secondary | ICD-10-CM | POA: Diagnosis not present

## 2018-06-10 DIAGNOSIS — L299 Pruritus, unspecified: Secondary | ICD-10-CM | POA: Diagnosis not present

## 2018-06-12 ENCOUNTER — Ambulatory Visit: Payer: Medicare HMO | Admitting: Gastroenterology

## 2018-06-12 ENCOUNTER — Encounter: Payer: Self-pay | Admitting: Gastroenterology

## 2018-06-12 ENCOUNTER — Other Ambulatory Visit: Payer: Self-pay

## 2018-06-12 VITALS — BP 154/66 | HR 62 | Ht 69.0 in | Wt 188.0 lb

## 2018-06-12 DIAGNOSIS — R1084 Generalized abdominal pain: Secondary | ICD-10-CM

## 2018-06-12 DIAGNOSIS — G8929 Other chronic pain: Secondary | ICD-10-CM | POA: Diagnosis not present

## 2018-06-12 DIAGNOSIS — R1031 Right lower quadrant pain: Secondary | ICD-10-CM

## 2018-06-12 DIAGNOSIS — K921 Melena: Secondary | ICD-10-CM | POA: Diagnosis not present

## 2018-06-12 NOTE — H&P (View-Only) (Signed)
Gastroenterology Consultation  Referring Provider:     [name], MD Primary Care Physician:  [name], MD Primary Gastroenterologist:  Dr. Allen Norris      Reason for Consultation:     Hematochezia and right lower quadrant pain        HPI:   Marco Sims is a 84 y.o. y/o male referred for consultation & management of right lower quadrant pain and hematochezia by Dr. [name], Iven Finn, MD.  This patient comes in today for evaluation of right-sided abdominal pain area the patient states that he had a very similar pain which was a result of a hernia.  The patient then had his hernia repaired and states that his pain went completely away until a few months ago.  He also reports that he had one episode of rectal bleeding.  His rectal bleeding was reported to be darker than he would expect with hemorrhoidal bleeding.  His abdominal discomfort in the right side is not made any worse or better by eating or drinking.  He also denies that moving his bowels make the pain any better or worse.  The patient's last colonoscopy was in [place] in 2010.  The patient was seen by surgery who did not think that the symptoms were hernia related.  The patient has recently gained some weight and denies any unexplained weight loss.  The patient is also concerned because his wife is undergoing surgery today for a small bowel obstruction.  He reports that she was seen multiple times and sent home after decompression and now is diagnosed with a possible small bowel mass.  He also mentions that he is concerned because he is seeing the newspaper and TV articles about problems with surgical meshes.  Past Medical History:  Diagnosis Date  . Allergy    seaasonal  . Anemia   . GERD (gastroesophageal reflux disease)   . Hypertension   . Lichen simplex chronicus     Past Surgical History:  Procedure Laterality Date  . Colles' fracture right 1956    . COLONOSCOPY  2013  . FRACTURE SURGERY    . fractured left arm  radius and  ulna 1955    . fractured right tibiofibular 2009    . HAND SURGERY  06/02/11  . HERNIA REPAIR    . herniorpahy, rt  05/09/2009  . NERVE REPAIR Right 08/12/2015   Procedure: EXPLORATION REPAIR  NERVE, DEBRIDEMENT FRACTURE RIGHT INDEX MIDDLE FINGER;  Surgeon: Daryll Brod, MD;  Location: [facility];  Service: Orthopedics;  Laterality: Right;  . ruptured left Achilles tendon 1980    . surgery for cubital tunnel syndrome and left arm 1999    . tonsillectomy 1942      Prior to Admission medications   Medication Sig Start Date End Date Taking? Authorizing Provider  amLODipine (NORVASC) 10 MG tablet Take 1 tablet (10 mg total) by mouth daily. Dr [name] 01/09/18 10/16/19 Yes [name], MD  aspirin 81 MG tablet Take 81 mg by mouth daily. Reported on 07/28/2015   Yes [provider]  hydrOXYzine (ATARAX/VISTARIL) 10 MG tablet Take 10 mg by mouth at bedtime. 05/14/18  Yes [provider]  Multiple Vitamin (MULTIVITAMIN WITH MINERALS) TABS tablet Take 1 tablet by mouth daily.   Yes [provider]  mupirocin ointment (BACTROBAN) 2 % APPLY 1 OINTMENT TOPICALLY TWICE DAILY AS NEEDED 02/11/18  Yes [provider]  omeprazole (PRILOSEC) 20 MG capsule TAKE ONE CAPSULE BY MOUTH  ONCE DAILY 05/01/18  Yes [name], MD  Pediatric Multivitamins-Iron (FLINTSTONES PLUS IRON PO) Take 1 tablet by mouth daily.   Yes [provider]  triamcinolone cream (KENALOG) 0.1 % Apply 1 application topically 2 (two) times daily. 10/05/17  Yes [name], MD    Family History  Problem Relation Age of Onset  . Parkinson's disease Mother   . Arthritis Father   . Cancer Brother      Social History   Tobacco Use  . Smoking status: Never Smoker  . Smokeless tobacco: Never Used  . Tobacco comment: smoking cessation materials not required  Substance Use Topics  . Alcohol use: Yes    Alcohol/week: 0.0 standard drinks    Comment: rare; 1 to 2  beers a month  . Drug use: No    Allergies as of 06/12/2018  . (No Known Allergies)    Review of Systems:    All systems reviewed and negative except where noted in HPI.   Physical Exam:  BP (!) 154/66   Pulse 62   Ht 5\' 9"  (1.753 m)   Wt 188 lb (85.3 kg)   BMI 27.76 kg/m  No LMP for male patient. General:   Alert,  Well-developed, well-nourished, pleasant and cooperative in NAD Head:  Normocephalic and atraumatic. Eyes:  Sclera clear, no icterus.   Conjunctiva pink. Ears:  Normal auditory acuity. Nose:  No deformity, discharge, or lesions. Mouth:  No deformity or lesions,oropharynx pink & moist. Neck:  Supple; no masses or thyromegaly. Lungs:  Respirations even and unlabored.  Clear throughout to auscultation.   No wheezes, crackles, or rhonchi. No acute distress. Heart:  Regular rate and rhythm; no murmurs, clicks, rubs, or gallops. Abdomen:  Normal bowel sounds.  No bruits.  Soft, non-tender and non-distended without masses, hepatosplenomegaly or hernias noted.  No guarding or rebound tenderness.  Negative Carnett sign.   Rectal:  Deferred.  Msk:  Symmetrical without gross deformities.  Good, equal movement & strength bilaterally. Pulses:  Normal pulses noted. Extremities:  No clubbing or edema.  No cyanosis. Neurologic:  Alert and oriented x3;  grossly normal neurologically. Skin:  Intact without significant lesions or rashes.  No jaundice. Lymph Nodes:  No significant cervical adenopathy. Psych:  Alert and cooperative. Normal mood and affect.  Imaging Studies: No results found.  Assessment and Plan:   Marco Sims is a 84 y.o. y/o male who has had right lower quadrant discomfort and rectal bleeding.  The patient will be set up for a colonoscopy to rule out any neoplasm as a cause of his rectal bleeding.  The patient will also be set up for a CT scan of the abdomen to rule out any intra-abdominal pathology as the cause of his rectal bleeding and right lower quadrant  pain.  The patient has been explained the plan and agrees with it.  Lucilla Lame, MD. [facility]    Note: This dictation was prepared with Dragon dictation along with smaller phrase technology. Any transcriptional errors that result from this process are unintentional.

## 2018-06-12 NOTE — Progress Notes (Signed)
Gastroenterology Consultation  Referring Provider:     [name], MD Primary Care Physician:  Marco Patch, MD Primary Gastroenterologist:  Dr. Allen Sims      Reason for Consultation:     Hematochezia and right lower quadrant pain        HPI:   Marco Sims is a 84 y.o. y/o male referred for consultation & management of right lower quadrant pain and hematochezia by Dr. Ronnald Sims, Marco Finn, MD.  This patient comes in today for evaluation of right-sided abdominal pain area the patient states that he had a very similar pain which was a result of a hernia.  The patient then had his hernia repaired and states that his pain went completely away until a few months ago.  He also reports that he had one episode of rectal bleeding.  His rectal bleeding was reported to be darker than he would expect with hemorrhoidal bleeding.  His abdominal discomfort in the right side is not made any worse or better by eating or drinking.  He also denies that moving his bowels make the pain any better or worse.  The patient's last colonoscopy was in [place] in 2010.  The patient was seen by surgery who did not think that the symptoms were hernia related.  The patient has recently gained some weight and denies any unexplained weight loss.  The patient is also concerned because his wife is undergoing surgery today for a small bowel obstruction.  He reports that she was seen multiple times and sent home after decompression and now is diagnosed with a possible small bowel mass.  He also mentions that he is concerned because he is seeing the newspaper and TV articles about problems with surgical meshes.  Past Medical History:  Diagnosis Date  . Allergy    seaasonal  . Anemia   . GERD (gastroesophageal reflux disease)   . Hypertension   . Lichen simplex chronicus     Past Surgical History:  Procedure Laterality Date  . Colles' fracture right 1956    . COLONOSCOPY  2013  . FRACTURE SURGERY    . fractured left arm  radius and  ulna 1955    . fractured right tibiofibular 2009    . HAND SURGERY  06/02/11  . HERNIA REPAIR    . herniorpahy, rt  05/09/2009  . NERVE REPAIR Right 08/12/2015   Procedure: EXPLORATION REPAIR  NERVE, DEBRIDEMENT FRACTURE RIGHT INDEX MIDDLE FINGER;  Surgeon: Marco Brod, MD;  Location: [place];  Service: Orthopedics;  Laterality: Right;  . ruptured left Achilles tendon 1980    . surgery for cubital tunnel syndrome and left arm 1999    . tonsillectomy 1942      Prior to Admission medications   Medication Sig Start Date End Date Taking? Authorizing Provider  amLODipine (NORVASC) 10 MG tablet Take 1 tablet (10 mg total) by mouth daily. Dr Marco Sims 01/09/18 10/16/19 Yes Marco Patch, MD  aspirin 81 MG tablet Take 81 mg by mouth daily. Reported on 07/28/2015   Yes [provider]  hydrOXYzine (ATARAX/VISTARIL) 10 MG tablet Take 10 mg by mouth at bedtime. 05/14/18  Yes [provider]  Multiple Vitamin (MULTIVITAMIN WITH MINERALS) TABS tablet Take 1 tablet by mouth daily.   Yes [provider]  mupirocin ointment (BACTROBAN) 2 % APPLY 1 OINTMENT TOPICALLY TWICE DAILY AS NEEDED 02/11/18  Yes [provider]  omeprazole (PRILOSEC) 20 MG capsule TAKE ONE CAPSULE BY MOUTH  ONCE DAILY 05/01/18  Yes Marco Patch, MD  Pediatric Multivitamins-Iron (FLINTSTONES PLUS IRON PO) Take 1 tablet by mouth daily.   Yes [provider]  triamcinolone cream (KENALOG) 0.1 % Apply 1 application topically 2 (two) times daily. 10/05/17  Yes Marco Patch, MD    Family History  Problem Relation Age of Onset  . Parkinson's disease Mother   . Arthritis Father   . Cancer Brother      Social History   Tobacco Use  . Smoking status: Never Smoker  . Smokeless tobacco: Never Used  . Tobacco comment: smoking cessation materials not required  Substance Use Topics  . Alcohol use: Yes    Alcohol/week: 0.0 standard drinks    Comment: rare; 1 to 2  beers a month  . Drug use: No    Allergies as of 06/12/2018  . (No Known Allergies)    Review of Systems:    All systems reviewed and negative except where noted in HPI.   Physical Exam:  BP (!) 154/66   Pulse 62   Ht 5\' 9"  (1.753 m)   Wt 188 lb (85.3 kg)   BMI 27.76 kg/m  No LMP for male patient. General:   Alert,  Well-developed, well-nourished, pleasant and cooperative in NAD Head:  Normocephalic and atraumatic. Eyes:  Sclera clear, no icterus.   Conjunctiva pink. Ears:  Normal auditory acuity. Nose:  No deformity, discharge, or lesions. Mouth:  No deformity or lesions,oropharynx pink & moist. Neck:  Supple; no masses or thyromegaly. Lungs:  Respirations even and unlabored.  Clear throughout to auscultation.   No wheezes, crackles, or rhonchi. No acute distress. Heart:  Regular rate and rhythm; no murmurs, clicks, rubs, or gallops. Abdomen:  Normal bowel sounds.  No bruits.  Soft, non-tender and non-distended without masses, hepatosplenomegaly or hernias noted.  No guarding or rebound tenderness.  Negative Carnett sign.   Rectal:  Deferred.  Msk:  Symmetrical without gross deformities.  Good, equal movement & strength bilaterally. Pulses:  Normal pulses noted. Extremities:  No clubbing or edema.  No cyanosis. Neurologic:  Alert and oriented x3;  grossly normal neurologically. Skin:  Intact without significant lesions or rashes.  No jaundice. Lymph Nodes:  No significant cervical adenopathy. Psych:  Alert and cooperative. Normal mood and affect.  Imaging Studies: No results found.  Assessment and Plan:   Marco Sims is a 84 y.o. y/o male who has had right lower quadrant discomfort and rectal bleeding.  The patient will be set up for a colonoscopy to rule out any neoplasm as a cause of his rectal bleeding.  The patient will also be set up for a CT scan of the abdomen to rule out any intra-abdominal pathology as the cause of his rectal bleeding and right lower quadrant  pain.  The patient has been explained the plan and agrees with it.  Marco Lame, MD. Marco Sims    Note: This dictation was prepared with Dragon dictation along with smaller phrase technology. Any transcriptional errors that result from this process are unintentional.

## 2018-06-12 NOTE — Patient Instructions (Addendum)
You are scheduled for CT abdomen/pelvis at [facility] [place] on Tuesday, Nov 12th at 9:30am. Please arrive at 9:15am. You cannot have anything to eat or drink after midnight on Monday night.   If you need to reschedule this appointment for any reason, please contact central scheduling at [phone].

## 2018-06-14 ENCOUNTER — Emergency Department: Payer: Medicare HMO

## 2018-06-14 ENCOUNTER — Other Ambulatory Visit: Payer: Self-pay

## 2018-06-14 ENCOUNTER — Ambulatory Visit (INDEPENDENT_AMBULATORY_CARE_PROVIDER_SITE_OTHER): Payer: Medicare HMO | Admitting: Family Medicine

## 2018-06-14 ENCOUNTER — Encounter: Payer: Self-pay | Admitting: Family Medicine

## 2018-06-14 ENCOUNTER — Emergency Department
Admission: EM | Admit: 2018-06-14 | Discharge: 2018-06-14 | Disposition: A | Discharge status: Home / Self Care | Payer: Medicare HMO | Attending: Emergency Medicine | Admitting: Emergency Medicine

## 2018-06-14 VITALS — BP 140/62 | HR 84 | Temp 98.2°F | Ht 69.0 in | Wt 188.0 lb

## 2018-06-14 DIAGNOSIS — J189 Pneumonia, unspecified organism: Secondary | ICD-10-CM | POA: Diagnosis not present

## 2018-06-14 DIAGNOSIS — R1084 Generalized abdominal pain: Secondary | ICD-10-CM | POA: Diagnosis not present

## 2018-06-14 DIAGNOSIS — R6883 Chills (without fever): Secondary | ICD-10-CM

## 2018-06-14 DIAGNOSIS — R531 Weakness: Secondary | ICD-10-CM | POA: Diagnosis not present

## 2018-06-14 DIAGNOSIS — M6281 Muscle weakness (generalized): Secondary | ICD-10-CM | POA: Diagnosis not present

## 2018-06-14 DIAGNOSIS — R079 Chest pain, unspecified: Secondary | ICD-10-CM | POA: Diagnosis not present

## 2018-06-14 DIAGNOSIS — L819 Disorder of pigmentation, unspecified: Secondary | ICD-10-CM | POA: Diagnosis not present

## 2018-06-14 DIAGNOSIS — R1031 Right lower quadrant pain: Secondary | ICD-10-CM | POA: Diagnosis not present

## 2018-06-14 DIAGNOSIS — K921 Melena: Secondary | ICD-10-CM

## 2018-06-14 DIAGNOSIS — R410 Disorientation, unspecified: Secondary | ICD-10-CM | POA: Diagnosis not present

## 2018-06-14 DIAGNOSIS — R404 Transient alteration of awareness: Secondary | ICD-10-CM | POA: Diagnosis not present

## 2018-06-14 DIAGNOSIS — Z79899 Other long term (current) drug therapy: Secondary | ICD-10-CM | POA: Diagnosis not present

## 2018-06-14 DIAGNOSIS — L400 Psoriasis vulgaris: Secondary | ICD-10-CM | POA: Diagnosis not present

## 2018-06-14 DIAGNOSIS — I10 Essential (primary) hypertension: Secondary | ICD-10-CM | POA: Insufficient documentation

## 2018-06-14 DIAGNOSIS — A419 Sepsis, unspecified organism: Secondary | ICD-10-CM

## 2018-06-14 DIAGNOSIS — Z7982 Long term (current) use of aspirin: Secondary | ICD-10-CM | POA: Diagnosis not present

## 2018-06-14 LAB — CBC WITH DIFFERENTIAL/PLATELET
ABS IMMATURE GRANULOCYTES: 0.08 10*3/uL — AB (ref 0.00–0.07)
Basophils Absolute: 0 10*3/uL (ref 0.0–0.1)
Basophils Relative: 0 %
Eosinophils Absolute: 0.1 10*3/uL (ref 0.0–0.5)
Eosinophils Relative: 0 %
HEMATOCRIT: 41.3 % (ref 39.0–52.0)
Hemoglobin: 13.9 g/dL (ref 13.0–17.0)
IMMATURE GRANULOCYTES: 1 %
LYMPHS ABS: 1.1 10*3/uL (ref 0.7–4.0)
LYMPHS PCT: 8 %
MCH: 28.8 pg (ref 26.0–34.0)
MCHC: 33.7 g/dL (ref 30.0–36.0)
MCV: 85.5 fL (ref 80.0–100.0)
MONOS PCT: 6 %
Monocytes Absolute: 1 10*3/uL (ref 0.1–1.0)
NEUTROS ABS: 12.7 10*3/uL — AB (ref 1.7–7.7)
NEUTROS PCT: 85 %
PLATELETS: 207 10*3/uL (ref 150–400)
RBC: 4.83 MIL/uL (ref 4.22–5.81)
RDW: 13.1 % (ref 11.5–15.5)
WBC: 15 10*3/uL — ABNORMAL HIGH (ref 4.0–10.5)
nRBC: 0 % (ref 0.0–0.2)

## 2018-06-14 LAB — URINALYSIS, COMPLETE (UACMP) WITH MICROSCOPIC
Bacteria, UA: NONE SEEN
Bilirubin Urine: NEGATIVE
GLUCOSE, UA: NEGATIVE mg/dL
Hgb urine dipstick: NEGATIVE
Ketones, ur: NEGATIVE mg/dL
Leukocytes, UA: NEGATIVE
Nitrite: NEGATIVE
PH: 7 (ref 5.0–8.0)
Protein, ur: NEGATIVE mg/dL
Specific Gravity, Urine: 1.016 (ref 1.005–1.030)
Squamous Epithelial / LPF: NONE SEEN (ref 0–5)

## 2018-06-14 LAB — COMPREHENSIVE METABOLIC PANEL
ALK PHOS: 87 U/L (ref 38–126)
ALT: 27 U/L (ref 0–44)
AST: 27 U/L (ref 15–41)
Albumin: 4.2 g/dL (ref 3.5–5.0)
Anion gap: 7 (ref 5–15)
BILIRUBIN TOTAL: 1.1 mg/dL (ref 0.3–1.2)
BUN: 16 mg/dL (ref 8–23)
CO2: 26 mmol/L (ref 22–32)
Calcium: 9 mg/dL (ref 8.9–10.3)
Chloride: 107 mmol/L (ref 98–111)
Creatinine, Ser: 0.93 mg/dL (ref 0.61–1.24)
GFR calc Af Amer: >60 mL/min (ref >60)
GFR calc non Af Amer: >60 mL/min (ref >60)
Glucose, Bld: 121 mg/dL — ABNORMAL HIGH (ref 70–99)
POTASSIUM: 3.8 mmol/L (ref 3.5–5.1)
Sodium: 140 mmol/L (ref 135–145)
TOTAL PROTEIN: 6.9 g/dL (ref 6.5–8.1)

## 2018-06-14 LAB — LACTIC ACID, PLASMA: Lactic Acid, Venous: 1.7 mmol/L (ref 0.5–1.9)

## 2018-06-14 LAB — INFLUENZA PANEL BY PCR (TYPE A & B)
INFLBPCR: NEGATIVE
Influenza A By PCR: NEGATIVE

## 2018-06-14 LAB — GLUCOSE, CAPILLARY: Glucose-Capillary: 95 mg/dL (ref 70–99)

## 2018-06-14 LAB — TROPONIN I: Troponin I: <0.03 ng/mL (ref <0.03)

## 2018-06-14 MED ORDER — SODIUM CHLORIDE 0.9 % IV BOLUS
500.0000 mL | Freq: Once | INTRAVENOUS | Status: AC
  Administered 2018-06-14: 500 mL via INTRAVENOUS

## 2018-06-14 MED ORDER — SODIUM CHLORIDE 0.9 % IV SOLN
1.0000 g | Freq: Once | INTRAVENOUS | Status: AC
  Administered 2018-06-14: 1 g via INTRAVENOUS
  Filled 2018-06-14: qty 10

## 2018-06-14 MED ORDER — IOPAMIDOL (ISOVUE-300) INJECTION 61%
100.0000 mL | Freq: Once | INTRAVENOUS | Status: AC | PRN
  Administered 2018-06-14: 100 mL via INTRAVENOUS

## 2018-06-14 MED ORDER — SODIUM CHLORIDE 0.9 % IV SOLN
500.0000 mg | Freq: Once | INTRAVENOUS | Status: AC
  Administered 2018-06-14: 500 mg via INTRAVENOUS
  Filled 2018-06-14: qty 500

## 2018-06-14 MED ORDER — IOPAMIDOL (ISOVUE-300) INJECTION 61%
30.0000 mL | Freq: Once | INTRAVENOUS | Status: AC | PRN
  Administered 2018-06-14: 30 mL via ORAL

## 2018-06-14 MED ORDER — AZITHROMYCIN 250 MG PO TABS: 250.0000 mg | ORAL_TABLET | Freq: Every day | ORAL | 0 refills | Status: AC

## 2018-06-14 MED ORDER — LEVOFLOXACIN 500 MG PO TABS: 500.0000 mg | ORAL_TABLET | Freq: Every day | ORAL | 0 refills | Status: DC

## 2018-06-14 NOTE — ED Notes (Signed)
Pt given rest of fluid bag (527ml) at 935ml/hour OK per Cherylann Banas, MD

## 2018-06-14 NOTE — Discharge Instructions (Addendum)
Take the antibiotic as prescribed starting tomorrow and finish the full 5-day course.  Make an appointment to follow-up with your primary care doctor in the next week.  You should also inform Dr. Allen Norris that the CT was performed while you are in the ER.  Your chest x-ray shows possible pneumonia.  The CT shows a 10 cm cyst in your right kidney.  There are also multiple nodules in the lower part of the lungs including a 9 mm nodule in the left lower lung.  This will require follow-up with a repeat CT.  You should discuss this with your regular doctor.  Return to the ER for new, worsening, persistent weakness, lethargy or changes in mental status, high fevers, vomiting, shortness of breath, or any other new or worsening symptoms that concern you.

## 2018-06-14 NOTE — ED Triage Notes (Signed)
Pt comes from PCP to rule out urosepsis per EMS. Pt reports some urinary frequency and some chills last night, but denies any flank pain, fever, pain, n/v. He says he remembers waking up this morning and it took him longer than normal to get ready because he was so tired. CBG 125 per EMS.

## 2018-06-14 NOTE — ED Notes (Signed)
Patient transported to X-ray 

## 2018-06-14 NOTE — ED Provider Notes (Signed)
[facility] Emergency Department Provider Note ____________________________________________   None    (approximate)  I have reviewed the triage vital signs and the nursing notes.   HISTORY  Chief Complaint Urinary Frequency and Fatigue    HPI Marco Sims is a 84 y.o. male with PMH as noted below who presents primarily with weakness, acute onset during the night last night and continuing this morning, associated with chills and rigors while he was in bed as well as with urinary frequency.  The patient currently states he just feels more tired and sleepy than usual.  He denies any pain currently and has no shortness of breath.  He was seen by his doctor today who sent him into the ER for evaluation for possible UTI/sepsis.  Past Medical History:  Diagnosis Date  . Allergy    seaasonal  . Anemia   . GERD (gastroesophageal reflux disease)   . Hypertension   . Lichen simplex chronicus     Patient Active Problem List   Diagnosis Date Noted  . Essential hypertension 01/09/2018  . GERD 11/04/2009  . ERECTILE DYSFUNCTION 07/28/2008  . DERMATITIS 07/28/2008  . LABYRINTHITIS 11/05/2007  . HYPERLIPIDEMIA 09/24/2007  . ANEMIA 09/24/2007  . CONDUCTIVE HEARING LOSS UNILATERAL 09/24/2007  . ARTHRITIS 09/24/2007  . UNS ADVRS EFF OTH RX MEDICINAL&BIOLOGICAL SBSTNC 09/24/2007    Past Surgical History:  Procedure Laterality Date  . Colles' fracture right 1956    . COLONOSCOPY  2013  . FRACTURE SURGERY    . fractured left arm radius and  ulna 1955    . fractured right tibiofibular 2009    . HAND SURGERY  06/02/11  . HERNIA REPAIR    . herniorpahy, rt  05/09/2009  . NERVE REPAIR Right 08/12/2015   Procedure: EXPLORATION REPAIR  NERVE, DEBRIDEMENT FRACTURE RIGHT INDEX MIDDLE FINGER;  Surgeon: Daryll Brod, MD;  Location: [place];  Service: Orthopedics;  Laterality: Right;  . ruptured left Achilles tendon 1980    . surgery for cubital tunnel  syndrome and left arm 1999    . tonsillectomy 1942      Prior to Admission medications   Medication Sig Start Date End Date Taking? Authorizing Provider  amLODipine (NORVASC) 10 MG tablet Take 1 tablet (10 mg total) by mouth daily. Dr [name] 01/09/18 10/16/19  [name], MD  aspirin 81 MG tablet Take 81 mg by mouth daily. Reported on 07/28/2015    [provider]  azithromycin (ZITHROMAX) 250 MG tablet Take 1 tablet (250 mg total) by mouth daily for 5 days. 06/15/18 06/20/18  Arta Silence, MD  hydrOXYzine (ATARAX/VISTARIL) 10 MG tablet Take 10 mg by mouth at bedtime. 05/14/18   [provider]  Multiple Vitamin (MULTIVITAMIN WITH MINERALS) TABS tablet Take 1 tablet by mouth daily.    [provider]  mupirocin ointment (BACTROBAN) 2 % APPLY 1 OINTMENT TOPICALLY TWICE DAILY AS NEEDED 02/11/18   [provider]  omeprazole (PRILOSEC) 20 MG capsule TAKE ONE CAPSULE BY MOUTH ONCE DAILY 05/01/18   [name], MD  Pediatric Multivitamins-Iron (FLINTSTONES PLUS IRON PO) Take 1 tablet by mouth daily.    [provider]  triamcinolone cream (KENALOG) 0.1 % Apply 1 application topically 2 (two) times daily. 10/05/17   [name], MD    Allergies Patient has no known allergies.  Family History  Problem Relation Age of Onset  . Parkinson's disease Mother   . Arthritis Father   . Cancer  Brother     Social History Social History   Tobacco Use  . Smoking status: Never Smoker  . Smokeless tobacco: Never Used  . Tobacco comment: smoking cessation materials not required  Substance Use Topics  . Alcohol use: Yes    Alcohol/week: 0.0 standard drinks    Comment: rare; 1 to 2 beers a month  . Drug use: No    Review of Systems  Constitutional: Positive for fatigue. Eyes: No redness. ENT: No sore throat. Cardiovascular: Denies chest pain. Respiratory: Denies shortness of breath. Gastrointestinal: No vomiting or diarrhea.    Genitourinary: Positive for frequency. Musculoskeletal: Negative for back pain. Skin: Negative for rash. Neurological: Negative for headache.  ____________________________________________   PHYSICAL EXAM:  VITAL SIGNS: ED Triage Vitals  Enc Vitals Group     BP      Pulse      Resp      Temp      Temp src      SpO2      Weight      Height      Head Circumference      Peak Flow      Pain Score      Pain Loc      Pain Edu?      Excl. in [place]?     Constitutional: Alert and oriented.  Relatively well appearing and in no acute distress. Eyes: Conjunctivae are normal.  EOMI. Head: Atraumatic. Nose: No congestion/rhinnorhea. Mouth/Throat: Mucous membranes are slightly dry.   Neck: Normal range of motion.  Cardiovascular: Normal rate, regular rhythm. Grossly normal heart sounds.  Good peripheral circulation. Respiratory: Normal respiratory effort.  No retractions. Lungs CTAB. Gastrointestinal: Soft and nontender. No distention.  Genitourinary: No flank tenderness. Musculoskeletal: No lower extremity edema.  Extremities warm and well perfused.  Neurologic:  Normal speech and language. No gross focal neurologic deficits are appreciated.  Skin:  Skin is warm and dry. No rash noted. Psychiatric: Mood and affect are normal. Speech and behavior are normal.  ____________________________________________   LABS (all labs ordered are listed, but only abnormal results are displayed)  Labs Reviewed  URINALYSIS, COMPLETE (UACMP) WITH MICROSCOPIC - Abnormal; Notable for the following components:      Result Value   Color, Urine YELLOW (*)    APPearance CLEAR (*)    All other components within normal limits  CBC WITH DIFFERENTIAL/PLATELET - Abnormal; Notable for the following components:   WBC 15.0 (*)    Neutro Abs 12.7 (*)    Abs Immature Granulocytes 0.08 (*)    All other components within normal limits  COMPREHENSIVE METABOLIC PANEL - Abnormal; Notable for the following  components:   Glucose, Bld 121 (*)    All other components within normal limits  CULTURE, BLOOD (ROUTINE X 2)  CULTURE, BLOOD (ROUTINE X 2)  INFLUENZA PANEL BY PCR (TYPE A & B)  LACTIC ACID, PLASMA  TROPONIN I  GLUCOSE, CAPILLARY  CBG MONITORING, ED   ____________________________________________  EKG  ED ECG REPORT I, Arta Silence, the attending physician, personally viewed and interpreted this ECG.  Date: 06/14/2018 EKG Time: 1551 Rate: 72 Rhythm: normal sinus rhythm QRS Axis: Left axis deviation Intervals: normal ST/T Wave abnormalities: normal Narrative Interpretation: no evidence of acute ischemia  ____________________________________________  RADIOLOGY  CXR: Right midlung opacity  ____________________________________________   PROCEDURES  Procedure(s) performed: No  Procedures  Critical Care performed: No ____________________________________________   INITIAL IMPRESSION / ASSESSMENT AND PLAN / ED COURSE  Pertinent labs &  imaging results that were available during my care of the patient were reviewed by me and considered in my medical decision making (see chart for details).  84 year old male with PMH as noted above presents with generalized weakness, chills, rigors, and urinary frequency since last night.  He denies pain or other acute symptoms currently.  On exam he is quite well-appearing and his vital signs are normal except for hypertension.  The remainder of his exam is unremarkable.  Differential includes UTI or other infection, dehydration, electrolyte abnormality, other metabolic cause, or less likely cardiac etiology.  We will obtain chest x-ray, labs for infection/sepsis work-up, cardiac enzymes, give fluids, and reassess.  ----------------------------------------- 10:15 PM on 06/14/2018 -----------------------------------------  The chest x-ray showed a right midlung opacity consistent with possible pneumonia, and the WBC count is  elevated.  The remainder of the work-up is negative.  There is no evidence of UTI, the chemistry is normal, and the troponin lactate are also normal.  The patient is flu negative.  I ordered ceftriaxone and azithromycin for CAP.  After further discussion with the family, they were concerned that the patient was ordered for an outpatient CT to be performed this week for subacute right lower quadrant pain.  They requested if the CT could be done while in the ED.  I ordered the CT after discussing with radiology.  The report did not crossover to epic but verbal report from radiology was that the CT showed no acute intra-abdominal abnormalities.  There is a large right renal cyst, tubal gallstones, as well as several lung base nodules including a 9 mm left lower lobe nodule which requires repeat imaging or biopsy.  I had an extensive discussion with the patient and his family members about the results of the work-up.  I thoroughly discussed the CT results with the patient and instruct him to follow-up with his primary care doctor for further work-up of the lung nodules.  His doctor should refer him to urologist for further evaluation of the renal cyst.  At this time, the patient's vital signs are normal.  He has been able to ambulate to the bathroom without difficulty.  He has remained alert throughout his ED stay.  There is no indication for admission at this time and the patient has a strong preference to go home.  I counseled the patient and his family members extensively on return precautions and they expressed understanding.  He is stable for discharge at this time.  ____________________________________________   FINAL CLINICAL IMPRESSION(S) / ED DIAGNOSES  Final diagnoses:  Weakness  Community acquired pneumonia of right lung, unspecified part of lung      NEW MEDICATIONS STARTED DURING THIS VISIT:  New Prescriptions   AZITHROMYCIN (ZITHROMAX) 250 MG TABLET    Take 1 tablet (250 mg  total) by mouth daily for 5 days.     Note:  This document was prepared using Dragon voice recognition software and may include unintentional dictation errors.    Arta Silence, MD 06/14/18 2218

## 2018-06-14 NOTE — Progress Notes (Signed)
Date:  06/14/2018   Name:  Marco Sims   DOB:  [birth year]   MRN:  [id number]   Chief Complaint: Extremity Weakness (feels "extremely tired', wanting to sleep, having shaking spells, no fever, dry mouth, having to pause between 3-4 words) .Extremity Weakness   This is a new problem. The current episode started yesterday. The problem occurs intermittently. The problem has been unchanged. The pain is mild. Pertinent negatives include no fever, inability to bear weight, itching, joint locking, joint swelling, limited range of motion, numbness, stiffness or tingling. He has tried acetaminophen for the symptoms. The treatment provided moderate relief. There is no history of diabetes, gout, osteoarthritis or rheumatoid arthritis.  Fever   Chronicity: for chills. The current episode started yesterday. The problem occurs intermittently. The problem has been unchanged. Pertinent negatives include no abdominal pain, chest pain, ear pain, headaches, sleepiness or sore throat.     Review of Systems  Constitutional: Negative for fever.  HENT: Negative for ear pain and sore throat.   Cardiovascular: Negative for chest pain.  Gastrointestinal: Negative for abdominal pain.  Musculoskeletal: Positive for extremity weakness. Negative for gout and stiffness.  Skin: Negative for itching.  Neurological: Negative for tingling, numbness and headaches.    Patient Active Problem List   Diagnosis Date Noted  . Essential hypertension 01/09/2018  . GERD 11/04/2009  . ERECTILE DYSFUNCTION 07/28/2008  . DERMATITIS 07/28/2008  . LABYRINTHITIS 11/05/2007  . HYPERLIPIDEMIA 09/24/2007  . ANEMIA 09/24/2007  . CONDUCTIVE HEARING LOSS UNILATERAL 09/24/2007  . ARTHRITIS 09/24/2007  . UNS ADVRS EFF OTH RX MEDICINAL&BIOLOGICAL SBSTNC 09/24/2007    No Known Allergies  Past Surgical History:  Procedure Laterality Date  . Colles' fracture right 1956    . COLONOSCOPY  2013  . FRACTURE SURGERY    . fractured  left arm radius and  ulna 1955    . fractured right tibiofibular 2009    . HAND SURGERY  06/02/11  . HERNIA REPAIR    . herniorpahy, rt  05/09/2009  . NERVE REPAIR Right 08/12/2015   Procedure: EXPLORATION REPAIR  NERVE, DEBRIDEMENT FRACTURE RIGHT INDEX MIDDLE FINGER;  Surgeon: Daryll Brod, MD;  Location: [place];  Service: Orthopedics;  Laterality: Right;  . ruptured left Achilles tendon 1980    . surgery for cubital tunnel syndrome and left arm 1999    . tonsillectomy 1942      Social History   Tobacco Use  . Smoking status: Never Smoker  . Smokeless tobacco: Never Used  . Tobacco comment: smoking cessation materials not required  Substance Use Topics  . Alcohol use: Yes    Alcohol/week: 0.0 standard drinks    Comment: rare; 1 to 2 beers a month  . Drug use: No     Medication list has been reviewed and updated.  No outpatient medications have been marked as taking for the 06/14/18 encounter (Office Visit) with [name], MD.    [facility] 2/9 Scores 01/09/2018 08/30/2017 06/13/2016 02/18/2015  PHQ - 2 Score 0 0 0 0  PHQ- 9 Score 0 - - -    Physical Exam  Constitutional: He is oriented to person, place, and time.  HENT:  Head: Normocephalic.  Right Ear: Hearing, tympanic membrane, external ear and ear canal normal.  Left Ear: Hearing, tympanic membrane, external ear and ear canal normal.  Nose: Nose normal. No mucosal edema.  Mouth/Throat: Oropharynx is clear and moist. No oropharyngeal exudate, posterior oropharyngeal edema or posterior oropharyngeal erythema.  Eyes: Pupils are equal, round, and reactive to light. Conjunctivae and EOM are normal. Right eye exhibits no discharge. Left eye exhibits no discharge. No scleral icterus.  Neck: Trachea normal and normal range of motion. Neck supple. Normal carotid pulses and no JVD present. Carotid bruit is not present. No tracheal deviation present. No thyroid mass and no thyromegaly present.  Cardiovascular: Normal  rate, regular rhythm, normal heart sounds and intact distal pulses. Exam reveals no gallop and no friction rub.  No murmur heard. Pulmonary/Chest: Breath sounds normal. No respiratory distress. He has no wheezes. He has no rales.  Abdominal: Soft. Normal aorta and bowel sounds are normal. He exhibits no mass. There is no hepatosplenomegaly. There is tenderness in the right lower quadrant. There is no rebound, no guarding and no CVA tenderness.  Genitourinary: Rectum normal. Rectal exam shows no mass and guaiac negative stool. Prostate is enlarged. Prostate is not tender.  Musculoskeletal: Normal range of motion. He exhibits no edema or tenderness.  Lymphadenopathy:    He has no cervical adenopathy.  Neurological: He is alert and oriented to person, place, and time. He has normal strength and normal reflexes. No cranial nerve deficit.  Skin: Skin is warm. No rash noted. No erythema. There is pallor.  Nursing note and vitals reviewed.   BP 140/62   Pulse 84   Temp 98.2 F (36.8 C) (Oral)   Ht 5\' 9"  (1.753 m)   Wt 188 lb (85.3 kg)   SpO2 99%   BMI 27.76 kg/m   Assessment and Plan:  1. Chills Acute.onset of chills last night.  Will transfer to the emergency room for evaluation via rescue squad.   2. Sepsis, due to unspecified organism, unspecified whether acute organ dysfunction present [facility]) Due to mental status changes and onset of chills I suspect there may be sepsis of urologic nature.  3. Disorientation Acute.  Patient has has difficulty with mentation which has progressively worsened over the day  4. Right lower quadrant abdominal pain Followed by Dr. Allen Norris.  Patient is scheduled for a CT scan for persistent right lower quadrant pain and episodes of hematochezia  5. Hematochezia Patient has noted bright red blood per rectum and is in the process of being evaluated. 6.  Dehydration patient unable to produce a urinalysis or unable due to enlarged prostate.  Dr. [facility]  06/14/2018

## 2018-06-18 ENCOUNTER — Ambulatory Visit: Admission: RE | Admit: 2018-06-18 | Payer: Medicare HMO | Source: Ambulatory Visit

## 2018-06-19 LAB — CULTURE, BLOOD (ROUTINE X 2)
CULTURE: NO GROWTH
Culture: NO GROWTH
Special Requests: ADEQUATE
Special Requests: ADEQUATE

## 2018-07-01 ENCOUNTER — Other Ambulatory Visit: Payer: Self-pay

## 2018-07-01 ENCOUNTER — Encounter: Payer: Self-pay | Admitting: *Deleted

## 2018-07-09 ENCOUNTER — Telehealth: Payer: Self-pay

## 2018-07-09 NOTE — Telephone Encounter (Signed)
Contacted patient to review colonoscopy prep instructions.  Patient has been provided with PlenVu.  Advised him to begin Plenvu Dose 1 at 5pm the evening before his colonoscopy, Dose 2 (Pouch  A and B)  5 hours before his colonoscopy time.  Time of procedure will be given to him on 07/10/18 after 11am [facility] will call.  Asked him to please call me if he has any questions.  Thanks [employer]

## 2018-07-10 NOTE — Discharge Instructions (Signed)
General Anesthesia, Adult, Care After °These instructions provide you with information about caring for yourself after your procedure. Your health care provider may also give you more specific instructions. Your treatment has been planned according to current medical practices, but problems sometimes occur. Call your health care provider if you have any problems or questions after your procedure. °What can I expect after the procedure? °After the procedure, it is common to have: °· Vomiting. °· A sore throat. °· Mental slowness. ° °It is common to feel: °· Nauseous. °· Cold or shivery. °· Sleepy. °· Tired. °· Sore or achy, even in parts of your body where you did not have surgery. ° °Follow these instructions at home: °For at least 24 hours after the procedure: °· Do not: °? Participate in activities where you could fall or become injured. °? Drive. °? Use heavy machinery. °? Drink alcohol. °? Take sleeping pills or medicines that cause drowsiness. °? Make important decisions or sign legal documents. °? Take care of children on your own. °· Rest. °Eating and drinking °· If you vomit, drink water, juice, or soup when you can drink without vomiting. °· Drink enough fluid to keep your urine clear or pale yellow. °· Make sure you have little or no nausea before eating solid foods. °· Follow the diet recommended by your health care provider. °General instructions °· Have a responsible adult stay with you until you are awake and alert. °· Return to your normal activities as told by your health care provider. Ask your health care provider what activities are safe for you. °· Take over-the-counter and prescription medicines only as told by your health care provider. °· If you smoke, do not smoke without supervision. °· Keep all follow-up visits as told by your health care provider. This is important. °Contact a health care provider if: °· You continue to have nausea or vomiting at home, and medicines are not helpful. °· You  cannot drink fluids or start eating again. °· You cannot urinate after 8-12 hours. °· You develop a skin rash. °· You have fever. °· You have increasing redness at the site of your procedure. °Get help right away if: °· You have difficulty breathing. °· You have chest pain. °· You have unexpected bleeding. °· You feel that you are having a life-threatening or urgent problem. °This information is not intended to replace advice given to you by your health care provider. Make sure you discuss any questions you have with your health care provider. °Document Released: 10/30/2000 Document Revised: 12/27/2015 Document Reviewed: 07/08/2015 °Elsevier Interactive Patient Education © 2018 [employer] ° °

## 2018-07-11 ENCOUNTER — Ambulatory Visit: Payer: Medicare HMO | Admitting: Anesthesiology

## 2018-07-11 ENCOUNTER — Ambulatory Visit
Admission: RE | Admit: 2018-07-11 | Discharge: 2018-07-11 | Disposition: A | Discharge status: Home / Self Care | Payer: Medicare HMO | Source: Ambulatory Visit | Attending: Gastroenterology | Admitting: Gastroenterology

## 2018-07-11 ENCOUNTER — Encounter
Admission: RE | Disposition: A | Discharge status: Home / Self Care | Payer: Self-pay | Source: Ambulatory Visit | Attending: Gastroenterology

## 2018-07-11 DIAGNOSIS — K219 Gastro-esophageal reflux disease without esophagitis: Secondary | ICD-10-CM | POA: Insufficient documentation

## 2018-07-11 DIAGNOSIS — Z79899 Other long term (current) drug therapy: Secondary | ICD-10-CM | POA: Insufficient documentation

## 2018-07-11 DIAGNOSIS — I10 Essential (primary) hypertension: Secondary | ICD-10-CM | POA: Diagnosis not present

## 2018-07-11 DIAGNOSIS — Z7982 Long term (current) use of aspirin: Secondary | ICD-10-CM | POA: Diagnosis not present

## 2018-07-11 DIAGNOSIS — K920 Hematemesis: Secondary | ICD-10-CM | POA: Diagnosis not present

## 2018-07-11 DIAGNOSIS — K642 Third degree hemorrhoids: Secondary | ICD-10-CM | POA: Insufficient documentation

## 2018-07-11 DIAGNOSIS — K921 Melena: Secondary | ICD-10-CM

## 2018-07-11 DIAGNOSIS — K5730 Diverticulosis of large intestine without perforation or abscess without bleeding: Secondary | ICD-10-CM | POA: Diagnosis not present

## 2018-07-11 DIAGNOSIS — D122 Benign neoplasm of ascending colon: Secondary | ICD-10-CM | POA: Diagnosis not present

## 2018-07-11 HISTORY — PX: POLYPECTOMY: SHX5525

## 2018-07-11 HISTORY — DX: Presence of external hearing-aid: Z97.4

## 2018-07-11 HISTORY — PX: COLONOSCOPY WITH PROPOFOL: SHX5780

## 2018-07-11 SURGERY — COLONOSCOPY WITH PROPOFOL
Anesthesia: General

## 2018-07-11 MED ORDER — LACTATED RINGERS IV SOLN
INTRAVENOUS | Status: DC
  Administered 2018-07-11: 09:00:00 via INTRAVENOUS

## 2018-07-11 MED ORDER — OXYCODONE HCL 5 MG PO TABS: 5.0000 mg | ORAL_TABLET | Freq: Once | ORAL | Status: DC | PRN

## 2018-07-11 MED ORDER — LACTATED RINGERS IV SOLN: INTRAVENOUS | Status: DC

## 2018-07-11 MED ORDER — PROPOFOL 10 MG/ML IV BOLUS
INTRAVENOUS | Status: DC | PRN
  Administered 2018-07-11 (×2): 20 mg via INTRAVENOUS
  Administered 2018-07-11: 100 mg via INTRAVENOUS
  Administered 2018-07-11 (×2): 20 mg via INTRAVENOUS

## 2018-07-11 MED ORDER — SODIUM CHLORIDE 0.9 % IV SOLN: INTRAVENOUS | Status: DC

## 2018-07-11 MED ORDER — OXYCODONE HCL 5 MG/5ML PO SOLN: 5.0000 mg | Freq: Once | ORAL | Status: DC | PRN

## 2018-07-11 MED ORDER — LIDOCAINE HCL (CARDIAC) PF 100 MG/5ML IV SOSY
PREFILLED_SYRINGE | INTRAVENOUS | Status: DC | PRN
  Administered 2018-07-11: 40 mg via INTRAVENOUS

## 2018-07-11 SURGICAL SUPPLY — 24 items
CANISTER SUCT 1200ML W/VALVE (MISCELLANEOUS) ×3 IMPLANT
CLIP HMST 235XBRD CATH ROT (MISCELLANEOUS) IMPLANT
CLIP RESOLUTION 360 11X235 (MISCELLANEOUS)
ELECT REM PT RETURN 9FT ADLT (ELECTROSURGICAL)
ELECTRODE REM PT RTRN 9FT ADLT (ELECTROSURGICAL) IMPLANT
FCP ESCP3.2XJMB 240X2.8X (MISCELLANEOUS) ×2
FORCEPS BIOP RAD 4 LRG CAP 4 (CUTTING FORCEPS) IMPLANT
FORCEPS BIOP RJ4 240 W/NDL (MISCELLANEOUS) ×1
FORCEPS ESCP3.2XJMB 240X2.8X (MISCELLANEOUS) ×2 IMPLANT
GOWN CVR UNV OPN BCK APRN NK (MISCELLANEOUS) ×4 IMPLANT
GOWN ISOL THUMB LOOP REG UNIV (MISCELLANEOUS) ×2
INJECTOR VARIJECT VIN23 (MISCELLANEOUS) IMPLANT
KIT DEFENDO VALVE AND CONN (KITS) IMPLANT
KIT ENDO PROCEDURE OLY (KITS) ×3 IMPLANT
MARKER SPOT ENDO TATTOO 5ML (MISCELLANEOUS) IMPLANT
PROBE APC STR FIRE (PROBE) IMPLANT
RETRIEVER NET ROTH 2.5X230 LF (MISCELLANEOUS) IMPLANT
SNARE SHORT THROW 13M SML OVAL (MISCELLANEOUS) IMPLANT
SNARE SHORT THROW 30M LRG OVAL (MISCELLANEOUS) IMPLANT
SNARE SNG USE RND 15MM (INSTRUMENTS) IMPLANT
SPOT EX ENDOSCOPIC TATTOO (MISCELLANEOUS)
TRAP ETRAP POLY (MISCELLANEOUS) IMPLANT
VARIJECT INJECTOR VIN23 (MISCELLANEOUS)
WATER STERILE IRR 250ML POUR (IV SOLUTION) ×3 IMPLANT

## 2018-07-11 NOTE — Anesthesia Preprocedure Evaluation (Signed)
Anesthesia Evaluation  Patient identified by MRN, date of birth, ID band  Reviewed: NPO status   Airway Mallampati: II  TM Distance: >3 FB Neck ROM: full    Dental  (+) Missing,    Pulmonary neg pulmonary ROS, pneumonia (mild; nov 2019), resolved,    Pulmonary exam normal        Cardiovascular Exercise Tolerance: Good hypertension, Normal cardiovascular exam     Neuro/Psych negative neurological ROS  negative psych ROS   GI/Hepatic Neg liver ROS, GERD  Controlled,  Endo/Other  negative endocrine ROS  Renal/GU negative Renal ROS  negative genitourinary   Musculoskeletal   Abdominal   Peds  Hematology  (+) Blood dyscrasia, anemia ,   Anesthesia Other Findings stress: 2016:  Normal Stress Echocardiogram NORMAL RIGHT VENTRICULAR SYSTOLIC FUNCTION TRIVIAL REGURGITATION NOTED NO VALVULAR STENOSIS NOTED;  cards stable: 03/2017: [place], np;   Reproductive/Obstetrics                             Anesthesia Physical Anesthesia Plan  ASA: II  Anesthesia Plan: General   Post-op Pain Management:    Induction:   PONV Risk Score and Plan:   Airway Management Planned: Natural Airway  Additional Equipment:   Intra-op Plan:   Post-operative Plan:   Informed Consent: I have reviewed the patients History and Physical, chart, labs and discussed the procedure including the risks, benefits and alternatives for the proposed anesthesia with the patient or authorized representative who has indicated his/her understanding and acceptance.     Plan Discussed with: CRNA  Anesthesia Plan Comments:         Anesthesia Quick Evaluation

## 2018-07-11 NOTE — Op Note (Addendum)
[facility] Gastroenterology Patient Name: Marco Sims Procedure Date: 07/11/2018 9:09 AM MRN: [id number] Account #: [id number] Date of Birth: [birth year] Admit Type: Outpatient Age: 84 Room: [facility] OR ROOM 01 Gender: Male Note Status: Finalized Procedure:            Colonoscopy Indications:          Hematochezia Providers:            Lucilla Lame MD, MD Referring MD:         [name], MD (Referring MD) Medicines:            Propofol per Anesthesia Complications:        No immediate complications. Procedure:            Pre-Anesthesia Assessment:                       - Prior to the procedure, a History and Physical was                        performed, and patient medications and allergies were                        reviewed. The patient's tolerance of previous                        anesthesia was also reviewed. The risks and benefits of                        the procedure and the sedation options and risks were                        discussed with the patient. All questions were                        answered, and informed consent was obtained. Prior                        Anticoagulants: The patient has taken no previous                        anticoagulant or antiplatelet agents. ASA Grade                        Assessment: II - A patient with mild systemic disease.                        After reviewing the risks and benefits, the patient was                        deemed in satisfactory condition to undergo the                        procedure.                       After obtaining informed consent, the colonoscope was                        passed under direct vision. Throughout the procedure,  the patient's blood pressure, pulse, and oxygen                        saturations were monitored continuously. The was                        introduced through the anus and advanced to the the                        cecum, identified  by appendiceal orifice and ileocecal                        valve. The colonoscopy was performed without                        difficulty. The patient tolerated the procedure well.                        The quality of the bowel preparation was excellent. Findings:      The perianal and digital rectal examinations were normal.      A 2 mm polyp was found in the ascending colon. The polyp was sessile.       The polyp was removed with a cold biopsy forceps. Resection and       retrieval were complete.      A few small-mouthed diverticula were found in the sigmoid colon.      Non-bleeding internal hemorrhoids were found during retroflexion. The       hemorrhoids were Grade III (internal hemorrhoids that prolapse but       require manual reduction). Impression:           - One 2 mm polyp in the ascending colon, removed with a                        cold biopsy forceps. Resected and retrieved.                       - Diverticulosis in the sigmoid colon.                       - Non-bleeding internal hemorrhoids. Recommendation:       - Discharge patient to home.                       - Continue present medications.                       - Await pathology results.                       - High fiber diet. Lucilla Lame MD, MD 07/11/2018 9:38:21 AM This report has been signed electronically. Number of Addenda: 0 Note Initiated On: 07/11/2018 9:09 AM Scope Withdrawal Time: 0 hours 8 minutes 34 seconds  Total Procedure Duration: 0 hours 15 minutes 24 seconds       [facility]

## 2018-07-11 NOTE — Transfer of Care (Signed)
Immediate Anesthesia Transfer of Care Note  Patient: Marco Sims  Procedure(s) Performed: COLONOSCOPY WITH PROPOFOL (N/A ) POLYPECTOMY  Patient Location: PACU  Anesthesia Type: General  Level of Consciousness: awake, alert  and patient cooperative  Airway and Oxygen Therapy: Patient Spontanous Breathing and Patient connected to supplemental oxygen  Post-op Assessment: Post-op Vital signs reviewed, Patient's Cardiovascular Status Stable, Respiratory Function Stable, Patent Airway and No signs of Nausea or vomiting  Post-op Vital Signs: Reviewed and stable  Complications: No apparent anesthesia complications

## 2018-07-11 NOTE — Interval H&P Note (Signed)
History and Physical Interval Note:  07/11/2018 9:06 AM  Marco Sims  has presented today for surgery, with the diagnosis of Rectal bleeding K92.1  The various methods of treatment have been discussed with the patient and family. After consideration of risks, benefits and other options for treatment, the patient has consented to  Procedure(s): COLONOSCOPY WITH PROPOFOL (N/A) as a surgical intervention .  The patient's history has been reviewed, patient examined, no change in status, stable for surgery.  I have reviewed the patient's chart and labs.  Questions were answered to the patient's satisfaction.     Nyrie Sigal [employer]

## 2018-07-11 NOTE — Anesthesia Procedure Notes (Signed)
Date/Time: 07/11/2018 9:16 AM Performed by: Cameron Ali, CRNA Pre-anesthesia Checklist: Patient identified, Emergency Drugs available, Suction available, Timeout performed and Patient being monitored Patient Re-evaluated:Patient Re-evaluated prior to induction Oxygen Delivery Method: Nasal cannula Placement Confirmation: positive ETCO2

## 2018-07-11 NOTE — Anesthesia Postprocedure Evaluation (Signed)
Anesthesia Post Note  Patient: Marco Sims  Procedure(s) Performed: COLONOSCOPY WITH PROPOFOL (N/A ) POLYPECTOMY  Patient location during evaluation: PACU Anesthesia Type: General Level of consciousness: awake and alert Pain management: pain level controlled Vital Signs Assessment: post-procedure vital signs reviewed and stable Respiratory status: spontaneous breathing, nonlabored ventilation, respiratory function stable and patient connected to nasal cannula oxygen Cardiovascular status: blood pressure returned to baseline and stable Postop Assessment: no apparent nausea or vomiting Anesthetic complications: no    Dawnyel Leven

## 2018-07-12 ENCOUNTER — Encounter: Payer: Self-pay | Admitting: Gastroenterology

## 2018-07-15 ENCOUNTER — Encounter: Payer: Self-pay | Admitting: Gastroenterology

## 2018-07-17 ENCOUNTER — Encounter: Payer: Self-pay | Admitting: Gastroenterology

## 2018-07-25 ENCOUNTER — Telehealth: Payer: Self-pay | Admitting: Gastroenterology

## 2018-07-25 NOTE — Telephone Encounter (Signed)
Let the patient know that no source of his [pain was seen on the CT or colonoscopy. His pain may not be GI. If he would like to discuss it further then have him come in.

## 2018-07-25 NOTE — Telephone Encounter (Signed)
Please call patient

## 2018-07-25 NOTE — Telephone Encounter (Signed)
Patient came by the [facility] office and is still having abd pain. Had colonoscopy and CT in ER (what can he do?

## 2018-07-26 NOTE — Telephone Encounter (Signed)
Spoke to the patient and he decided to schedule an appt.

## 2018-08-08 ENCOUNTER — Ambulatory Visit (INDEPENDENT_AMBULATORY_CARE_PROVIDER_SITE_OTHER): Payer: Medicare HMO | Admitting: Surgery

## 2018-08-08 ENCOUNTER — Other Ambulatory Visit: Payer: Self-pay

## 2018-08-08 ENCOUNTER — Encounter: Payer: Self-pay | Admitting: Surgery

## 2018-08-08 VITALS — BP 169/73 | HR 66 | Temp 96.6°F | Resp 16 | Ht 69.0 in | Wt 187.0 lb

## 2018-08-08 DIAGNOSIS — M792 Neuralgia and neuritis, unspecified: Secondary | ICD-10-CM | POA: Diagnosis not present

## 2018-08-08 NOTE — Patient Instructions (Signed)
The patient is aware to call back for any questions or new concerns.  

## 2018-08-08 NOTE — Progress Notes (Signed)
Surgical Clinic Progress/Follow-up Note   HPI:  85 y.o. Male presents to clinic for follow-up evaluation of persistent "mild subtle" RLQ abdominal - groin pain 4 years s/p open Right and Left inguinal hernia repairs with mesh with a reported more challenging/painful recovery following his Right inguinal hernia repair than his Left. Patient reports his RLQ/groin pain has continued without improvement or worsening, but he again reiterates it "doesn't really bother" him much; rather, he just knows "that it is there". He has also during the interval period underwent recommended overdue colonoscopy with ongoing +flatus and +BM's without any subsequent bright red blood per rectum and denies abdominal bloating, N/V, fever/chills, CP, or SOB.  Review of Systems:  Constitutional: denies any other weight loss, fever, chills, or sweats  Eyes: denies any other vision changes, history of eye injury  ENT: denies sore throat, hearing problems  Respiratory: denies shortness of breath, wheezing  Cardiovascular: denies chest pain, palpitations  Gastrointestinal: abdominal pain, N/V, and bowel function as per HPI Musculoskeletal: denies any other joint pains or cramps  Skin: Denies any other rashes or skin discolorations  Neurological: denies any other headache, dizziness, weakness  Psychiatric: denies any other depression, anxiety  All other review of systems: otherwise negative   Vital Signs:  BP (!) 169/73   Pulse 66   Temp (!) 96.6 F (35.9 C) (Skin)   Resp 16   Ht 5\' 9"  (1.753 m)   Wt 187 lb (84.8 kg)   SpO2 96%   BMI 27.62 kg/m    Physical Exam:  Constitutional:  -- Normal body habitus  -- Awake, alert, and oriented x3  Eyes:  -- Pupils equally round and reactive to light  -- No scleral icterus  Ear, nose, throat:  -- No jugular venous distension  -- No nasal drainage, bleeding Pulmonary:  -- No crackles -- Equal breath sounds bilaterally -- Breathing non-labored at  rest Cardiovascular:  -- S1, S2 present  -- No pericardial rubs  Gastrointestinal:  -- Soft, nontender, non-distended, no guarding/rebound  -- No abdominal hernias/masses appreciated, pulsatile or otherwise  Musculoskeletal / Integumentary:  -- Wounds or skin discoloration: None appreciated  -- Extremities: B/L UE and LE FROM, hands and feet warm, no edema  Neurologic:  -- Motor function: intact and symmetric  -- Sensation: intact and symmetric   Imaging:  Colonoscopy Allen Norris, 07/11/2018) - One 2 mm polyp in the ascending colon, removed with a cold biopsy forceps. Resected and retrieved. - Diverticulosis in the sigmoid colon. - Non-bleeding internal hemorrhoids.   Assessment:  85 y.o. yo Male with a problem list including...  Patient Active Problem List   Diagnosis Date Noted  . Hematochezia   . Benign neoplasm of ascending colon   . Essential hypertension 01/09/2018  . GERD 11/04/2009  . ERECTILE DYSFUNCTION 07/28/2008  . DERMATITIS 07/28/2008  . LABYRINTHITIS 11/05/2007  . HYPERLIPIDEMIA 09/24/2007  . ANEMIA 09/24/2007  . CONDUCTIVE HEARING LOSS UNILATERAL 09/24/2007  . ARTHRITIS 09/24/2007  . 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  Plan:   - results of recent colonoscopy and CT discussed  - also discussed differential diagnoses, including post-herniorrhapy neuralgia and possibility for nerve injection  - patient expresses he is not interested in nerve injection  for possible post-herniorrhapy and says the pain "doesn't bother" him much  - return to clinic as needed, instructed to call office if any questions or concerns  All of the above recommendations  were discussed with the patient, and all of patient's questions were answered to his expressed satisfaction.  -- Marilynne Drivers Rosana Hoes, MD, [place]: [facility] Partnering for exceptional care. Office: [phone]

## 2018-08-12 ENCOUNTER — Encounter: Payer: Self-pay | Admitting: Surgery

## 2018-08-14 DIAGNOSIS — L400 Psoriasis vulgaris: Secondary | ICD-10-CM | POA: Diagnosis not present

## 2018-08-26 ENCOUNTER — Ambulatory Visit: Payer: Medicare HMO | Admitting: Gastroenterology

## 2018-08-26 ENCOUNTER — Other Ambulatory Visit: Payer: Self-pay

## 2018-08-26 ENCOUNTER — Encounter: Payer: Self-pay | Admitting: Gastroenterology

## 2018-08-26 VITALS — BP 161/68 | HR 105 | Ht 69.0 in | Wt 188.4 lb

## 2018-08-26 DIAGNOSIS — R1031 Right lower quadrant pain: Secondary | ICD-10-CM | POA: Diagnosis not present

## 2018-08-26 DIAGNOSIS — G8929 Other chronic pain: Secondary | ICD-10-CM | POA: Diagnosis not present

## 2018-08-26 NOTE — Progress Notes (Signed)
Primary Care Physician: [name], MD  Primary Gastroenterologist:  Dr. Lucilla Lame  No chief complaint on file.   HPI: Marco Sims is a 85 y.o. male here for follow-up of abdominal pain.  The patient has been seen by surgery who thought it may be post-herniorrhapy neuralgia.  He was offered injections by surgery to try and alleviate the pain but declined.  The patient had a CT scan of the abdomen that did not show any cause for his pain and it was not felt that his symptoms were consistent with a GI etiology when he was last seen by me.  The patient now reports that the pain is still not associated with eating drinking for moving his bowels.  He denies any change in bowel habits or unexplained weight loss.  Current Outpatient Medications  Medication Sig Dispense Refill  . amLODipine (NORVASC) 10 MG tablet Take 1 tablet (10 mg total) by mouth daily. Dr [name] 90 tablet 1  . aspirin 81 MG tablet Take 81 mg by mouth daily. Reported on 07/28/2015    . clobetasol cream (TEMOVATE) 0.05 %     . hydrOXYzine (ATARAX/VISTARIL) 10 MG tablet Take 10 mg by mouth at bedtime.  2  . Multiple Vitamin (MULTIVITAMIN WITH MINERALS) TABS tablet Take 1 tablet by mouth daily.    . mupirocin ointment (BACTROBAN) 2 % APPLY 1 OINTMENT TOPICALLY TWICE DAILY AS NEEDED  1  . omeprazole (PRILOSEC) 20 MG capsule TAKE ONE CAPSULE BY MOUTH ONCE DAILY 90 capsule 3  . Pediatric Multivitamins-Iron (FLINTSTONES PLUS IRON PO) Take 1 tablet by mouth daily.    Marland Kitchen triamcinolone cream (KENALOG) 0.1 % Apply 1 application topically 2 (two) times daily. 453.6 g 1   No current facility-administered medications for this visit.     Allergies as of 08/26/2018  . (No Known Allergies)    ROS:  General: Negative for anorexia, weight loss, fever, chills, fatigue, weakness. ENT: Negative for hoarseness, difficulty swallowing , nasal congestion. CV: Negative for chest pain, angina, palpitations, dyspnea on exertion,  peripheral edema.  Respiratory: Negative for dyspnea at rest, dyspnea on exertion, cough, sputum, wheezing.  GI: See history of present illness. GU:  Negative for dysuria, hematuria, urinary incontinence, urinary frequency, nocturnal urination.  Endo: Negative for unusual weight change.    Physical Examination:   BP (!) 161/68   Pulse (!) 105   Ht 5\' 9"  (1.753 m)   Wt 188 lb 6.4 oz (85.5 kg)   BMI 27.82 kg/m   General: Well-nourished, well-developed in no acute distress.  Eyes: No icterus. Conjunctivae pink. Mouth: Oropharyngeal mucosa moist and pink , no lesions erythema or exudate. Lungs: Clear to auscultation bilaterally. Non-labored. Heart: Regular rate and rhythm, no murmurs rubs or gallops.  Abdomen: Bowel sounds are normal, nontender, nondistended, no hepatosplenomegaly or masses, no abdominal bruits or hernia , no rebound or guarding.   Extremities: No lower extremity edema. No clubbing or deformities. Neuro: Alert and oriented x 3.  Grossly intact. Skin: Warm and dry, no jaundice.   Psych: Alert and cooperative, normal mood and affect.  Labs:    Imaging Studies: No results found.  Assessment and Plan:   Marco Sims is a 85 y.o. y/o male with abdominal pain that does not appear to be a GI pain.  It is associated with body position.  It does not get better or worse with any GI functions.  The patient had a CT scan that did not show  any cause for his abdominal pain and did show a nodule with a suggestion for a repeat CT scan due to a lung nodule.  The patient states he hasn't a point with his primary care provider next week and has been told to keep in mind that the bony nodule showed be evaluated.  The patient's colonoscopy did not show any source for the patient discomfort either.  Nothing further to do from a GI point of view.    Lucilla Lame, MD. Marval Regal   Note: This dictation was prepared with Dragon dictation along with smaller phrase technology. Any transcriptional  errors that result from this process are unintentional.

## 2018-09-04 ENCOUNTER — Ambulatory Visit: Payer: Self-pay

## 2018-09-11 ENCOUNTER — Ambulatory Visit (INDEPENDENT_AMBULATORY_CARE_PROVIDER_SITE_OTHER): Payer: Medicare HMO

## 2018-09-11 VITALS — BP 122/68 | HR 63 | Temp 97.5°F | Resp 16 | Ht 69.0 in | Wt 189.0 lb

## 2018-09-11 DIAGNOSIS — Z0000 Encounter for general adult medical examination without abnormal findings: Secondary | ICD-10-CM | POA: Diagnosis not present

## 2018-09-11 NOTE — Patient Instructions (Signed)
Marco Sims , Thank you for taking time to come for your Medicare Wellness Visit. I appreciate your ongoing commitment to your health goals. Please review the following plan we discussed and let me know if I can assist you in the future.   Screening recommendations/referrals: Colonoscopy: done 07/11/18 Recommended yearly ophthalmology/optometry visit for glaucoma screening and checkup Recommended yearly dental visit for hygiene and checkup  Vaccinations: Influenza vaccine: done 05/01/18 Pneumococcal vaccine: done 08/30/17 Tdap vaccine: done 03/20/11 Shingles vaccine: Shingrix discussed. Please contact your pharmacy for coverage information.     Advanced directives: Advance directive discussed with you today. I have provided a copy for you to complete at home and have notarized. Once this is complete please bring a copy in to our office so we can scan it into your chart.  Conditions/risks identified: Recommend increasing physical activity to 150 minutes per week.   Next appointment: Please follow up in one year for your Medicare Annual Wellness visit.    Preventive Care 34 Years and Older, Male Preventive care refers to lifestyle choices and visits with your health care provider that can promote health and wellness. What does preventive care include?  A yearly physical exam. This is also called an annual well check.  Dental exams once or twice a year.  Routine eye exams. Ask your health care provider how often you should have your eyes checked.  Personal lifestyle choices, including:  Daily care of your teeth and gums.  Regular physical activity.  Eating a healthy diet.  Avoiding tobacco and drug use.  Limiting alcohol use.  Practicing safe sex.  Taking low doses of aspirin every day.  Taking vitamin and mineral supplements as recommended by your health care provider. What happens during an annual well check? The services and screenings done by your health care provider  during your annual well check will depend on your age, overall health, lifestyle risk factors, and family history of disease. Counseling  Your health care provider may ask you questions about your:  Alcohol use.  Tobacco use.  Drug use.  Emotional well-being.  Home and relationship well-being.  Sexual activity.  Eating habits.  History of falls.  Memory and ability to understand (cognition).  Work and work Statistician. Screening  You may have the following tests or measurements:  Height, weight, and BMI.  Blood pressure.  Lipid and cholesterol levels. These may be checked every 5 years, or more frequently if you are over 16 years old.  Skin check.  Lung cancer screening. You may have this screening every year starting at age 87 if you have a 30-pack-year history of smoking and currently smoke or have quit within the past 15 years.  Fecal occult blood test (FOBT) of the stool. You may have this test every year starting at age 4.  Flexible sigmoidoscopy or colonoscopy. You may have a sigmoidoscopy every 5 years or a colonoscopy every 10 years starting at age 37.  Prostate cancer screening. Recommendations will vary depending on your family history and other risks.  Hepatitis C blood test.  Hepatitis B blood test.  Sexually transmitted disease (STD) testing.  Diabetes screening. This is done by checking your blood sugar (glucose) after you have not eaten for a while (fasting). You may have this done every 1-3 years.  Abdominal aortic aneurysm (AAA) screening. You may need this if you are a current or former smoker.  Osteoporosis. You may be screened starting at age 6 if you are at high risk. Talk with  your health care provider about your test results, treatment options, and if necessary, the need for more tests. Vaccines  Your health care provider may recommend certain vaccines, such as:  Influenza vaccine. This is recommended every year.  Tetanus,  diphtheria, and acellular pertussis (Tdap, Td) vaccine. You may need a Td booster every 10 years.  Zoster vaccine. You may need this after age 53.  Pneumococcal 13-valent conjugate (PCV13) vaccine. One dose is recommended after age 67.  Pneumococcal polysaccharide (PPSV23) vaccine. One dose is recommended after age 32. Talk to your health care provider about which screenings and vaccines you need and how often you need them. This information is not intended to replace advice given to you by your health care provider. Make sure you discuss any questions you have with your health care provider. Document Released: 08/20/2015 Document Revised: 04/12/2016 Document Reviewed: 05/25/2015 Elsevier Interactive Patient Education  2017 [place] Prevention in the Home Falls can cause injuries. They can happen to people of all ages. There are many things you can do to make your home safe and to help prevent falls. What can I do on the outside of my home?  Regularly fix the edges of walkways and driveways and fix any cracks.  Remove anything that might make you trip as you walk through a door, such as a raised step or threshold.  Trim any bushes or trees on the path to your home.  Use bright outdoor lighting.  Clear any walking paths of anything that might make someone trip, such as rocks or tools.  Regularly check to see if handrails are loose or broken. Make sure that both sides of any steps have handrails.  Any raised decks and porches should have guardrails on the edges.  Have any leaves, snow, or ice cleared regularly.  Use sand or salt on walking paths during winter.  Clean up any spills in your garage right away. This includes oil or grease spills. What can I do in the bathroom?  Use night lights.  Install grab bars by the toilet and in the tub and shower. Do not use towel bars as grab bars.  Use non-skid mats or decals in the tub or shower.  If you need to sit down in  the shower, use a plastic, non-slip stool.  Keep the floor dry. Clean up any water that spills on the floor as soon as it happens.  Remove soap buildup in the tub or shower regularly.  Attach bath mats securely with double-sided non-slip rug tape.  Do not have throw rugs and other things on the floor that can make you trip. What can I do in the bedroom?  Use night lights.  Make sure that you have a light by your bed that is easy to reach.  Do not use any sheets or blankets that are too big for your bed. They should not hang down onto the floor.  Have a firm chair that has side arms. You can use this for support while you get dressed.  Do not have throw rugs and other things on the floor that can make you trip. What can I do in the kitchen?  Clean up any spills right away.  Avoid walking on wet floors.  Keep items that you use a lot in easy-to-reach places.  If you need to reach something above you, use a strong step stool that has a grab bar.  Keep electrical cords out of the way.  Do not  use floor polish or wax that makes floors slippery. If you must use wax, use non-skid floor wax.  Do not have throw rugs and other things on the floor that can make you trip. What can I do with my stairs?  Do not leave any items on the stairs.  Make sure that there are handrails on both sides of the stairs and use them. Fix handrails that are broken or loose. Make sure that handrails are as long as the stairways.  Check any carpeting to make sure that it is firmly attached to the stairs. Fix any carpet that is loose or worn.  Avoid having throw rugs at the top or bottom of the stairs. If you do have throw rugs, attach them to the floor with carpet tape.  Make sure that you have a light switch at the top of the stairs and the bottom of the stairs. If you do not have them, ask someone to add them for you. What else can I do to help prevent falls?  Wear shoes that:  Do not have high  heels.  Have rubber bottoms.  Are comfortable and fit you well.  Are closed at the toe. Do not wear sandals.  If you use a stepladder:  Make sure that it is fully opened. Do not climb a closed stepladder.  Make sure that both sides of the stepladder are locked into place.  Ask someone to hold it for you, if possible.  Clearly mark and make sure that you can see:  Any grab bars or handrails.  First and last steps.  Where the edge of each step is.  Use tools that help you move around (mobility aids) if they are needed. These include:  Canes.  Walkers.  Scooters.  Crutches.  Turn on the lights when you go into a dark area. Replace any light bulbs as soon as they burn out.  Set up your furniture so you have a clear path. Avoid moving your furniture around.  If any of your floors are uneven, fix them.  If there are any pets around you, be aware of where they are.  Review your medicines with your doctor. Some medicines can make you feel dizzy. This can increase your chance of falling. Ask your doctor what other things that you can do to help prevent falls. This information is not intended to replace advice given to you by your health care provider. Make sure you discuss any questions you have with your health care provider. Document Released: 05/20/2009 Document Revised: 12/30/2015 Document Reviewed: 08/28/2014 Elsevier Interactive Patient Education  2017 [employer].

## 2018-09-11 NOTE — Progress Notes (Signed)
Subjective:   Marco Sims is a 85 y.o. male who presents for Medicare Annual/Subsequent preventive examination.  Review of Systems:  Cardiac Risk Factors include: advanced age (>45men, >52 women);hypertension;male gender     Objective:    Vitals: BP 122/68 (BP Location: Left Arm, Patient Position: Sitting, Cuff Size: Normal)   Pulse 63   Temp (!) 97.5 F (36.4 C) (Oral)   Resp 16   Ht 5\' 9"  (1.753 m)   Wt 189 lb (85.7 kg)   SpO2 99%   BMI 27.91 kg/m   Body mass index is 27.91 kg/m.  Advanced Directives 09/11/2018 07/11/2018 06/14/2018 08/30/2017 08/12/2015 08/10/2015 07/28/2015  Does Patient Have a Medical Advance Directive? Yes No No No No Yes No  Type of Advance Directive - - - - - Press photographer;Living will -  Does patient want to make changes to medical advance directive? Yes (MAU/Ambulatory/Procedural Areas - Information given) - - - - - -  Would patient like information on creating a medical advance directive? - Yes (MAU/Ambulatory/Procedural Areas - Information given) - Yes (MAU/Ambulatory/Procedural Areas - Information given) No - patient declined information - -    Tobacco Social History   Tobacco Use  Smoking Status Never Smoker  Smokeless Tobacco Never Used  Tobacco Comment   smoking cessation materials not required     Counseling given: Not Answered Comment: smoking cessation materials not required   Clinical Intake:  Pre-visit preparation completed: Yes  Pain : No/denies pain     BMI - recorded: 27.91 Nutritional Status: BMI 25 -29 Overweight Nutritional Risks: None Diabetes: No  How often do you need to have someone help you when you read instructions, pamphlets, or other written materials from your doctor or pharmacy?: 1 - Never What is the last grade level you completed in school?: bachelor's degree  Interpreter Needed?: No  Information entered by :: Marco Marker LPN  Past Medical History:  Diagnosis Date  . Allergy    seaasonal  . Anemia   . Colon polyp   . GERD (gastroesophageal reflux disease)   . Hypertension   . Lichen simplex chronicus   . Wears hearing aid in both ears    Past Surgical History:  Procedure Laterality Date  . Colles' fracture right 1956    . COLONOSCOPY  2013  . COLONOSCOPY WITH PROPOFOL N/A 07/11/2018   Procedure: COLONOSCOPY WITH PROPOFOL;  Surgeon: Lucilla Lame, MD;  Location: [facility];  Service: Endoscopy;  Laterality: N/A;  . FRACTURE SURGERY    . fractured left arm radius and  ulna 1955    . fractured right tibiofibular 2009    . HAND SURGERY  06/02/11  . HERNIA REPAIR Right 2010, 2015  . HERNIA REPAIR Left 2015  . herniorpahy, rt  05/09/2009  . NERVE REPAIR Right 08/12/2015   Procedure: EXPLORATION REPAIR  NERVE, DEBRIDEMENT FRACTURE RIGHT INDEX MIDDLE FINGER;  Surgeon: Daryll Brod, MD;  Location: [facility];  Service: Orthopedics;  Laterality: Right;  . POLYPECTOMY  07/11/2018   Procedure: POLYPECTOMY;  Surgeon: Lucilla Lame, MD;  Location: [facility];  Service: Endoscopy;;  . ruptured left Achilles tendon 1980    . surgery for cubital tunnel syndrome and left arm 1999    . tonsillectomy 1942     Family History  Problem Relation Age of Onset  . Parkinson's disease Mother   . Arthritis Father   . Cancer Brother    Social History   Socioeconomic History  .  Marital status: Soil scientist    Spouse name: Not on file  . Number of children: 3  . Years of education: Not on file  . Highest education level: Bachelor's degree (e.g., BA, AB, BS)  Occupational History  . Occupation: Retired  Scientific laboratory technician  . Financial resource strain: Not hard at all  . Food insecurity:    Worry: Never true    Inability: Never true  . Transportation needs:    Medical: No    Non-medical: No  Tobacco Use  . Smoking status: Never Smoker  . Smokeless tobacco: Never Used  . Tobacco comment: smoking cessation materials not required  Substance and  Sexual Activity  . Alcohol use: Yes    Alcohol/week: 0.0 standard drinks    Comment: rare; 1 to 2 beers a month  . Drug use: No  . Sexual activity: Not Currently  Lifestyle  . Physical activity:    Days per week: 0 days    Minutes per session: 0 min  . Stress: Not at all  Relationships  . Social connections:    Talks on phone: More than three times a week    Gets together: Twice a week    Attends religious service: More than 4 times per year    Active member of club or organization: No    Attends meetings of clubs or organizations: Never    Relationship status: Living with partner  Other Topics Concern  . Not on file  Social History Narrative  . Not on file    Outpatient Encounter Medications as of 09/11/2018  Medication Sig  . amLODipine (NORVASC) 10 MG tablet Take 1 tablet (10 mg total) by mouth daily. Dr [name]  . aspirin 81 MG tablet Take 81 mg by mouth daily. Reported on 07/28/2015  . clobetasol cream (TEMOVATE) 0.05 %   . Cyanocobalamin (VITAMIN B-12) 5000 MCG SUBL Place 1 tablet under the tongue daily.  . hydrOXYzine (ATARAX/VISTARIL) 10 MG tablet Take 10 mg by mouth at bedtime.  Javier Docker Oil 350 MG CAPS Take 1 capsule by mouth daily.  . Multiple Vitamin (MULTIVITAMIN WITH MINERALS) TABS tablet Take 1 tablet by mouth daily.  . mupirocin ointment (BACTROBAN) 2 % APPLY 1 OINTMENT TOPICALLY TWICE DAILY AS NEEDED  . omeprazole (PRILOSEC) 20 MG capsule TAKE ONE CAPSULE BY MOUTH ONCE DAILY  . triamcinolone cream (KENALOG) 0.1 % Apply 1 application topically 2 (two) times daily.  . [DISCONTINUED] Pediatric Multivitamins-Iron (FLINTSTONES PLUS IRON PO) Take 1 tablet by mouth daily.   No facility-administered encounter medications on file as of 09/11/2018.     Activities of Daily Living In your present state of health, do you have any difficulty performing the following activities: 09/11/2018 07/11/2018  Hearing? Y N  Comment wears hearing aids -  Vision? N N  Comment wears  glasses -  Difficulty concentrating or making decisions? N N  Walking or climbing stairs? N N  Dressing or bathing? N N  Doing errands, shopping? N -  Preparing Food and eating ? N -  Using the Toilet? N -  In the past six months, have you accidently leaked urine? N -  Do you have problems with loss of bowel control? N -  Managing your Medications? N -  Managing your Finances? N -  Housekeeping or managing your Housekeeping? N -  Some recent data might be hidden    Patient Care Team: [name], MD as PCP - General (Family Medicine) Corey Skains, MD  as Consulting Physician (Cardiology)   Assessment:   This is a routine wellness examination for [place].  Exercise Activities and Dietary recommendations Current Exercise Habits: The patient does not participate in regular exercise at present, Exercise limited by: None identified  Goals    . Exercise 150 min/wk Moderate Activity     Recommend to exercise for at least 150 minutes per week.       Fall Risk Fall Risk  09/11/2018 08/08/2018 08/30/2017 06/13/2016 02/18/2015  Falls in the past year? 1 0 Yes Yes No  Number falls in past yr: 1 - 1 1 -  Injury with Fall? 0 - No No -  Risk for fall due to : - - History of fall(s);Impaired vision - -  Risk for fall due to: Comment - - wears eyeglasses - -  Follow up Falls prevention discussed Falls evaluation completed Falls evaluation completed;Education provided;Falls prevention discussed Falls evaluation completed -   FALL RISK PREVENTION PERTAINING TO THE HOME:  Any stairs in or around the home WITH handrails? Yes  Home free of loose throw rugs in walkways, pet beds, electrical cords, etc? Yes  Adequate lighting in your home to reduce risk of falls? Yes   ASSISTIVE DEVICES UTILIZED TO PREVENT FALLS:  Life alert? No  Use of a cane, walker or w/c? No  Grab bars in the bathroom? Yes  Shower chair or bench in shower? Yes  Elevated toilet seat or a handicapped toilet? Yes   DME  ORDERS:  DME order needed?  No   TIMED UP AND GO:  Was the test performed? Yes .  Length of time to ambulate 10 feet: 5 sec.   GAIT:  Appearance of gait: Gait stead-fast and without the use of an assistive device.  Education: Fall risk prevention has been discussed.  Intervention(s) required? No    Depression Screen PHQ 2/9 Scores 09/11/2018 01/09/2018 08/30/2017 06/13/2016  PHQ - 2 Score 0 0 0 0  PHQ- 9 Score - 0 - -    Cognitive Function     6CIT Screen 09/11/2018 08/30/2017  What Year? 0 points 4 points  What month? 0 points 0 points  What time? 0 points 0 points  Count back from 20 0 points 0 points  Months in reverse 0 points 0 points  Repeat phrase 0 points 0 points  Total Score 0 4    Immunization History  Administered Date(s) Administered  . Influenza Split 04/26/2012  . Influenza Whole 06/13/2007  . Influenza, High Dose Seasonal PF 05/01/2018  . Influenza,inj,Quad PF,6+ Mos 04/18/2013, 06/03/2015  . Influenza-Unspecified 05/07/2013, 05/07/2014, 05/07/2017  . Pneumococcal Conjugate-13 01/27/2015  . Pneumococcal Polysaccharide-23 08/30/2017  . Td 08/08/2003  . Tdap 03/20/2011    Qualifies for Shingles Vaccine? Yes  Zostavax completed per patient at the [place]. Due for Shingrix. Education has been provided regarding the importance of this vaccine. Pt has been advised to call insurance company to determine out of pocket expense. Advised may also receive vaccine at local pharmacy or Health Dept. Verbalized acceptance and understanding.  Tdap: Up to date  Flu Vaccine: Up to date  Pneumococcal Vaccine: Up to date   Screening Tests Health Maintenance  Topic Date Due  . TETANUS/TDAP  03/19/2021  . INFLUENZA VACCINE  Completed  . PNA vac Low Risk Adult  Completed   Cancer Screenings:  Colorectal Screening: Completed 07/11/18.   Lung Cancer Screening: (Low Dose CT Chest recommended if Age 9-80 years, 30 pack-year currently smoking OR have quit  w/in 15years.) does  not qualify.    Additional Screening:  Hepatitis C Screening: no longer required  Vision Screening: Recommended annual ophthalmology exams for early detection of glaucoma and other disorders of the eye. Is the patient up to date with their annual eye exam?  No  Who is the provider or what is the name of the office in which the pt attends annual eye exams? [facility]   Dental Screening: Recommended annual dental exams for proper oral hygiene  Community Resource Referral:  CRR required this visit?  No       Plan:    I have personally reviewed and addressed the Medicare Annual Wellness questionnaire and have noted the following in the patient's chart:  A. Medical and social history B. Use of alcohol, tobacco or illicit drugs  C. Current medications and supplements D. Functional ability and status E.  Nutritional status F.  Physical activity G. Advance directives H. List of other physicians I.  Hospitalizations, surgeries, and ER visits in previous 12 months J.  [place] such as hearing and vision if needed, cognitive and depression L. Referrals and appointments   In addition, I have reviewed and discussed with patient certain preventive protocols, quality metrics, and best practice recommendations. A written personalized care plan for preventive services as well as general preventive health recommendations were provided to patient.    Signed,  Marco Marker, LPN Nurse Health Advisor   Nurse Notes: pt doing well, no complaints today. Follow up visit scheduled for 09/17/18 10:40 with Dr. [name] regarding lung nodules.

## 2018-09-17 ENCOUNTER — Ambulatory Visit (INDEPENDENT_AMBULATORY_CARE_PROVIDER_SITE_OTHER): Payer: Medicare HMO | Admitting: Family Medicine

## 2018-09-17 ENCOUNTER — Encounter: Payer: Self-pay | Admitting: Family Medicine

## 2018-09-17 ENCOUNTER — Other Ambulatory Visit: Payer: Self-pay | Admitting: Oncology

## 2018-09-17 VITALS — BP 130/70 | HR 64 | Ht 69.0 in | Wt 190.0 lb

## 2018-09-17 DIAGNOSIS — R918 Other nonspecific abnormal finding of lung field: Secondary | ICD-10-CM | POA: Diagnosis not present

## 2018-09-17 DIAGNOSIS — K8020 Calculus of gallbladder without cholecystitis without obstruction: Secondary | ICD-10-CM | POA: Diagnosis not present

## 2018-09-17 DIAGNOSIS — R911 Solitary pulmonary nodule: Secondary | ICD-10-CM

## 2018-09-17 DIAGNOSIS — N401 Enlarged prostate with lower urinary tract symptoms: Secondary | ICD-10-CM

## 2018-09-17 DIAGNOSIS — R350 Frequency of micturition: Secondary | ICD-10-CM

## 2018-09-17 DIAGNOSIS — Z1211 Encounter for screening for malignant neoplasm of colon: Secondary | ICD-10-CM | POA: Diagnosis not present

## 2018-09-17 LAB — HEMOCCULT GUIAC POC 1CARD (OFFICE): Fecal Occult Blood, POC: NEGATIVE

## 2018-09-17 MED ORDER — TAMSULOSIN HCL 0.4 MG PO CAPS: 0.4000 mg | ORAL_CAPSULE | Freq: Every day | ORAL | 3 refills | Status: DC

## 2018-09-17 NOTE — Progress Notes (Signed)
CT scan without contrast placed for lung nodule follow-up. Will call patient to get scheduled and for pulmonary lung nodule clinic visit at [facility] with Faythe Casa, AGNP-C. Referred by Dr. [name] his PCP.   Faythe Casa, NP 09/17/2018 1:31 PM

## 2018-09-17 NOTE — Progress Notes (Signed)
Date:  09/17/2018   Name:  Marco Sims   DOB:  [birth year]   MRN:  [id number]   Chief Complaint: Follow-up (discuss 3 month follow up CT for 66mm pulmonary nodule)  Patient brought in to discuss pulmonary nodules and next step to evaluate.  Benign Prostatic Hypertrophy  This is a new problem. The current episode started more than 1 month ago. Irritative symptoms include frequency, nocturia and urgency. Obstructive symptoms include dribbling and incomplete emptying. Obstructive symptoms do not include an intermittent stream, a slower stream, straining or a weak stream. Pertinent negatives include no chills, dysuria, genital pain, hematuria, hesitancy, nausea or vomiting.    Review of Systems  Constitutional: Negative for chills and fever.  HENT: Negative for drooling, ear discharge, ear pain and sore throat.   Respiratory: Negative for cough, shortness of breath and wheezing.   Cardiovascular: Negative for chest pain, palpitations and leg swelling.  Gastrointestinal: Negative for abdominal pain, blood in stool, constipation, diarrhea, nausea and vomiting.  Endocrine: Negative for polydipsia.  Genitourinary: Positive for frequency, incomplete emptying, nocturia and urgency. Negative for dysuria, hematuria and hesitancy.  Musculoskeletal: Negative for back pain, myalgias and neck pain.  Skin: Negative for rash.  Allergic/Immunologic: Negative for environmental allergies.  Neurological: Negative for dizziness and headaches.  Hematological: Does not bruise/bleed easily.  Psychiatric/Behavioral: Negative for suicidal ideas. The patient is not nervous/anxious.     Patient Active Problem List   Diagnosis Date Noted  . Hematochezia   . Benign neoplasm of ascending colon   . Essential hypertension 01/09/2018  . GERD 11/04/2009  . ERECTILE DYSFUNCTION 07/28/2008  . DERMATITIS 07/28/2008  . LABYRINTHITIS 11/05/2007  . HYPERLIPIDEMIA 09/24/2007  . ANEMIA 09/24/2007  . CONDUCTIVE  HEARING LOSS UNILATERAL 09/24/2007  . ARTHRITIS 09/24/2007  . UNS ADVRS EFF OTH RX MEDICINAL&BIOLOGICAL SBSTNC 09/24/2007    No Known Allergies  Past Surgical History:  Procedure Laterality Date  . Colles' fracture right 1956    . COLONOSCOPY  2013  . COLONOSCOPY WITH PROPOFOL N/A 07/11/2018   Procedure: COLONOSCOPY WITH PROPOFOL;  Surgeon: Lucilla Lame, MD;  Location: [facility];  Service: Endoscopy;  Laterality: N/A;  . FRACTURE SURGERY    . fractured left arm radius and  ulna 1955    . fractured right tibiofibular 2009    . HAND SURGERY  06/02/11  . HERNIA REPAIR Right 2010, 2015  . HERNIA REPAIR Left 2015  . herniorpahy, rt  05/09/2009  . NERVE REPAIR Right 08/12/2015   Procedure: EXPLORATION REPAIR  NERVE, DEBRIDEMENT FRACTURE RIGHT INDEX MIDDLE FINGER;  Surgeon: Daryll Brod, MD;  Location: [facility];  Service: Orthopedics;  Laterality: Right;  . POLYPECTOMY  07/11/2018   Procedure: POLYPECTOMY;  Surgeon: Lucilla Lame, MD;  Location: [facility];  Service: Endoscopy;;  . ruptured left Achilles tendon 1980    . surgery for cubital tunnel syndrome and left arm 1999    . tonsillectomy 1942      Social History   Tobacco Use  . Smoking status: Never Smoker  . Smokeless tobacco: Never Used  . Tobacco comment: smoking cessation materials not required  Substance Use Topics  . Alcohol use: Yes    Alcohol/week: 0.0 standard drinks    Comment: rare; 1 to 2 beers a month  . Drug use: No     Medication list has been reviewed and updated.  Current Meds  Medication Sig  . amLODipine (NORVASC) 10 MG tablet Take 1 tablet (10 mg  total) by mouth daily. Dr [name]  . aspirin 81 MG tablet Take 81 mg by mouth daily. Reported on 07/28/2015  . clobetasol cream (TEMOVATE) 0.05 %   . Cyanocobalamin (VITAMIN B-12) 5000 MCG SUBL Place 1 tablet under the tongue daily.  . hydrOXYzine (ATARAX/VISTARIL) 10 MG tablet Take 10 mg by mouth at bedtime.  Javier Docker Oil  350 MG CAPS Take 1 capsule by mouth daily.  . Multiple Vitamin (MULTIVITAMIN WITH MINERALS) TABS tablet Take 1 tablet by mouth daily.  . mupirocin ointment (BACTROBAN) 2 % APPLY 1 OINTMENT TOPICALLY TWICE DAILY AS NEEDED  . omeprazole (PRILOSEC) 20 MG capsule TAKE ONE CAPSULE BY MOUTH ONCE DAILY  . triamcinolone cream (KENALOG) 0.1 % Apply 1 application topically 2 (two) times daily.    PHQ 2/9 Scores 09/11/2018 01/09/2018 08/30/2017 06/13/2016  PHQ - 2 Score 0 0 0 0  PHQ- 9 Score - 0 - -    Physical Exam Vitals signs and nursing note reviewed.  HENT:     Head: Normocephalic.     Right Ear: External ear normal.     Left Ear: External ear normal.     Nose: Nose normal.  Eyes:     General: No scleral icterus.       Right eye: No discharge.        Left eye: No discharge.     Conjunctiva/sclera: Conjunctivae normal.     Pupils: Pupils are equal, round, and reactive to light.  Neck:     Musculoskeletal: Normal range of motion and neck supple.     Thyroid: No thyromegaly.     Vascular: No JVD.     Trachea: No tracheal deviation.  Cardiovascular:     Rate and Rhythm: Normal rate and regular rhythm.     Heart sounds: Normal heart sounds. No murmur. No friction rub. No gallop.   Pulmonary:     Effort: No respiratory distress.     Breath sounds: Normal breath sounds. No wheezing or rales.  Abdominal:     General: Bowel sounds are normal.     Palpations: Abdomen is soft. There is no mass.     Tenderness: There is no abdominal tenderness. There is no guarding or rebound.  Genitourinary:    Prostate: Enlarged. Not tender and no nodules present.     Rectum: Normal. Guaiac result negative. No mass or tenderness.  Musculoskeletal: Normal range of motion.        General: No tenderness.     Right lower leg: No edema.     Left lower leg: No edema.  Lymphadenopathy:     Cervical: No cervical adenopathy.  Skin:    General: Skin is warm.     Findings: No rash.  Neurological:     Mental  Status: He is alert and oriented to person, place, and time.     Cranial Nerves: No cranial nerve deficit.     Deep Tendon Reflexes: Reflexes are normal and symmetric.     BP 130/70   Pulse 64   Ht 5\' 9"  (1.753 m)   Wt 190 lb (86.2 kg)   BMI 28.06 kg/m   Assessment and Plan: .1. Pulmonary nodules/lesions, multiple Double pulmonary nodules were noted on a CT scan that was done of the abdomen pelvis 3 months ago.  It was recommended that this had up a follow-up for 3 months and we are at the level of evaluation now was discussed with the patient and CT and chest x-ray was reviewed  and decision was then made a follow up appointment with the pulmonary nodule clinic.  Discussed this with Shaun, will be calling patient and I assessedto this disposition - AMB  Referral to Pulmonary Nodule Clinic  2. Benign prostatic hyperplasia with urinary frequenc y Evaluation of the CT scan noted an enlargement of the prostate today's visit we discussed symptoms of a prostate and it appears that the patient is having significant nocturia and frequency.  Will initiate tamsulosin 0.4 daily and refer to urology for further evaluation. we will also obtain a PSA for evaluation. - tamsulosin (FLOMAX) 0.4 MG CAPS capsule; Take 1 capsule (0.4 mg total) by mouth daily.  Dispense: 30 capsule; Refill: 3 - PSA - Ambulatory referral to Urology  3. Gall stones We discussed that the we also saw gallstones on the CT but patient is currently asymptomatic but will keep this in consideration should any abdominal discomfort in the midepigastric or upper right quadrant developed.  4. Colon cancer screening Discussed and guaiac was noted to be negative. - POCT occult blood stool

## 2018-09-18 LAB — PSA: Prostate Specific Ag, Serum: 2.6 ng/mL (ref 0.0–4.0)

## 2018-09-19 ENCOUNTER — Other Ambulatory Visit: Payer: Self-pay | Admitting: Oncology

## 2018-09-19 NOTE — Progress Notes (Signed)
  Pulmonary Nodule Clinic Telephone Note  Received referral from PCP, Dr. [name].   Per most recent guidelines and recommendations from [place] (2017), this patient requires a CT scan without contrast,  3 months from last scan and follow-up visit in the pulmonary nodule clinic a few days later.    I have personally reviewed all patient's previous imaging. Last CT scan completed on 06/14/18 revealed multiple bilateral lung base nodules with largest in left lower lobe measuring 9 mm not clearly seen on comparison CT.  Previous imaging from 06/07/2012 revealed revealed multiple nodular densities in the lung bases that are nonspecific but suggestive of possible areas of granulomatosis disease or other benign etiologies.. Per Fleischner guidelines (2017), for both low risk and high risk individuals either a repeat chest CT in 3 months, follow-up PET/CT or tissue sampling is recommended.  High risk factors include: History of heavy smoking, exposure to asbestos, radium or uranium, personal family history of lung cancer, older age, sex (females greater than males), race (black and native [place] greater than weight), marginal speculation, upper lobe location, multiplicity (less than 5 nodules increases risk for malignancy) and emphysema and/or pulmonary fibrosis.   This recommendation follows the consensus statement: Guidelines for Management of Incidental Pulmonary Nodules Detected on CT Images: From the Fleischner Society 2017; Radiology 2017; 284:228-243.    I have placed order for CT scan without contrast to be completed ASAP.   I would like him to see me in our Pulmonary Nodule Clinic after his CT scan on scheduled clinic day (Friday Morning).   Scheduling has been notified of appointment request and will call patient with appointment.   CT scan without contrast scheduled for 09/24/2018.  Patient is scheduled to be seen in pulmonary nodule clinic on 09/27/2018.  Faythe Casa,  NP 09/18/2018 2:22 PM

## 2018-09-20 ENCOUNTER — Telehealth: Payer: Self-pay

## 2018-09-20 NOTE — Telephone Encounter (Signed)
Shawn from pulmonary clinic called to schedule appt for pt. Spoke to [employer] concerning appts. Pt will have a CT on Tuesday the 18th in [place] at 1:30 and on Friday the 21st at 10:00 the pt will see Jeannie in the [facility] for follow up.

## 2018-09-24 ENCOUNTER — Ambulatory Visit
Admission: RE | Admit: 2018-09-24 | Discharge: 2018-09-24 | Disposition: A | Discharge status: Home / Self Care | Payer: Medicare HMO | Source: Ambulatory Visit | Attending: Oncology | Admitting: Oncology

## 2018-09-24 ENCOUNTER — Ambulatory Visit: Payer: Medicare HMO

## 2018-09-24 DIAGNOSIS — J984 Other disorders of lung: Secondary | ICD-10-CM | POA: Diagnosis not present

## 2018-09-24 DIAGNOSIS — R911 Solitary pulmonary nodule: Secondary | ICD-10-CM | POA: Diagnosis not present

## 2018-09-27 ENCOUNTER — Inpatient Hospital Stay: Payer: Medicare HMO | Admitting: Oncology

## 2018-09-30 ENCOUNTER — Inpatient Hospital Stay: Payer: Medicare HMO | Attending: Oncology | Admitting: Oncology

## 2018-09-30 VITALS — BP 148/75 | HR 60 | Temp 97.7°F | Resp 16 | Wt 191.0 lb

## 2018-09-30 DIAGNOSIS — D649 Anemia, unspecified: Secondary | ICD-10-CM | POA: Insufficient documentation

## 2018-09-30 DIAGNOSIS — Z79899 Other long term (current) drug therapy: Secondary | ICD-10-CM | POA: Insufficient documentation

## 2018-09-30 DIAGNOSIS — K219 Gastro-esophageal reflux disease without esophagitis: Secondary | ICD-10-CM | POA: Diagnosis not present

## 2018-09-30 DIAGNOSIS — R911 Solitary pulmonary nodule: Secondary | ICD-10-CM

## 2018-09-30 DIAGNOSIS — N401 Enlarged prostate with lower urinary tract symptoms: Secondary | ICD-10-CM | POA: Diagnosis not present

## 2018-09-30 DIAGNOSIS — F0390 Unspecified dementia without behavioral disturbance: Secondary | ICD-10-CM | POA: Diagnosis not present

## 2018-09-30 DIAGNOSIS — E785 Hyperlipidemia, unspecified: Secondary | ICD-10-CM | POA: Insufficient documentation

## 2018-09-30 DIAGNOSIS — Z7982 Long term (current) use of aspirin: Secondary | ICD-10-CM | POA: Insufficient documentation

## 2018-09-30 DIAGNOSIS — R69 Illness, unspecified: Secondary | ICD-10-CM | POA: Diagnosis not present

## 2018-09-30 DIAGNOSIS — R918 Other nonspecific abnormal finding of lung field: Secondary | ICD-10-CM | POA: Insufficient documentation

## 2018-09-30 DIAGNOSIS — I10 Essential (primary) hypertension: Secondary | ICD-10-CM | POA: Insufficient documentation

## 2018-09-30 NOTE — Progress Notes (Addendum)
Pulmonary Nodule Clinic Consult note [facility]  [phone] [phone]  Patient Care Team: [name], MD as PCP - General (Family Medicine) Corey Skains, MD as Consulting Physician (Cardiology)   Name of the patient: Marco Sims  [id number]  [birth year]   Date of visit: 09/30/2018   Diagnosis- Incidental Lung Nodule   Chief complaint/ Reason for visit- Initial visit to pulmonary nodule clinic  PMH history: Patient referred by PCP Dr. [name] to our clinic for recommendations and follow-up of incidental lung nodule findings.  Imaging from 06/14/2018 reveaed multiple bilateral lung base nodules with largest in left lower lobe measuring 9 mm not clearly seen on comparison CT.  Comparison imaging from 06/07/2012 revealed multiple nodular densities in the lung bases that were nonspecific but suggestive of possible areas of granulomatosis disease or other benign etiology. No further imaging available.   Fleischner guidelines (2017), for both low risk and high risk individuals recommended based on November 2019 imaging either a repeat chest CT in 3 months, follow-up with PET/CT or possibly tissue sampling. Patient opted for repeat chest CT in 3 months.  Imaging from 09/24/2018 redemonstrated small nodules of the lingula, left lower lobe and right lower lobe.  Noncontrast chest CT at 6 to 12 months is recommended.  If nodule is stable at that time repeat CT in 18 to 24 months from today's scan.   Interval history- Patient is a non-smoker; has never smoked.  Denies any occupational exposure to asbestos, radium or uranium.  Denies any family history of lung cancer.  Denies any known history of lung disease. Denies previous personal or family history of malignancy.   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    Occupation: Working with [employer].  Exposure to sawdust.  Clydene Laming dust is considered a group 1 carcinogen and responsible for up to 27% of  lung cancers in men.  I explained to [name] that lung cancer is multifactorial and sawdust alone is less likely  to cause lung cancer.  We discussed other occupational substances such as diesel fumes, natural fibers such as asbestos, silica and wood dust, metals including aluminum, arsenic and beryllium, radon, reactive chemicals, secondhand smoke and solvents like benzene and Toluene.  Most common occupations associated with the development of lung cancer include painters, Dispensing optician work, metal work, Tourist information centre manager, Conservation officer, historic buildings, truck driving, Chemical engineer, Press photographer, aluminum Engineer, civil (consulting).  He has past medical history of essential hypertension, GERD, colon polyp, conductive hearing loss, arthritis, hyperlipidemia, anemia and dementia.   Currently, he denies any neurological complaints, recent fevers or illnesses, easy bleeding or bruising.  Maintains a good appetite and denies weight loss.  He denies chest pain, nausea, vomiting, constipation or diarrhea.  Has history of BPH with intermittent urinary retention. On flomax.  Denies any respiratory concerns including shortness of breath, cough, wheezing or hemoptysis.  ECOG FS:1 - Symptomatic but completely ambulatory  Review of systems- Review of Systems  Constitutional: Positive for malaise/fatigue. Negative for chills, fever and weight loss.  HENT: Negative for congestion, ear pain and tinnitus.   Eyes: Negative.  Negative for blurred vision and double vision.  Respiratory: Negative.  Negative for cough, sputum production and shortness of breath.   Cardiovascular: Negative.  Negative for chest pain, palpitations and leg swelling.  Gastrointestinal: Negative.  Negative for abdominal pain, constipation, diarrhea, nausea and vomiting.  Genitourinary: Positive for urgency. Negative for dysuria and frequency.  Musculoskeletal: Negative for back pain  and falls.  Skin: Negative.  Negative for rash.  Neurological: Negative.  Negative  for weakness and headaches.  Endo/Heme/Allergies: Negative.  Does not bruise/bleed easily.  Psychiatric/Behavioral: Positive for memory loss. Negative for depression. The patient is not nervous/anxious and does not have insomnia.     No Known Allergies   Past Medical History:  Diagnosis Date  . Allergy    seaasonal  . Anemia   . Colon polyp   . GERD (gastroesophageal reflux disease)   . Hypertension   . Lichen simplex chronicus   . Wears hearing aid in both ears      Past Surgical History:  Procedure Laterality Date  . Colles' fracture right 1956    . COLONOSCOPY  2013  . COLONOSCOPY WITH PROPOFOL N/A 07/11/2018   Procedure: COLONOSCOPY WITH PROPOFOL;  Surgeon: Lucilla Lame, MD;  Location: [facility];  Service: Endoscopy;  Laterality: N/A;  . FRACTURE SURGERY    . fractured left arm radius and  ulna 1955    . fractured right tibiofibular 2009    . HAND SURGERY  06/02/11  . HERNIA REPAIR Right 2010, 2015  . HERNIA REPAIR Left 2015  . herniorpahy, rt  05/09/2009  . NERVE REPAIR Right 08/12/2015   Procedure: EXPLORATION REPAIR  NERVE, DEBRIDEMENT FRACTURE RIGHT INDEX MIDDLE FINGER;  Surgeon: Daryll Brod, MD;  Location: [place];  Service: Orthopedics;  Laterality: Right;  . POLYPECTOMY  07/11/2018   Procedure: POLYPECTOMY;  Surgeon: Lucilla Lame, MD;  Location: [place];  Service: Endoscopy;;  . ruptured left Achilles tendon 1980    . surgery for cubital tunnel syndrome and left arm 1999    . tonsillectomy 1942      Social History   Socioeconomic History  . Marital status: Soil scientist    Spouse name: Not on file  . Number of children: 3  . Years of education: Not on file  . Highest education level: Bachelor's degree (e.g., BA, AB, BS)  Occupational History  . Occupation: Retired  Scientific laboratory technician  . Financial resource strain: Not hard at all  . Food insecurity:    Worry: Never true    Inability: Never true  . Transportation  needs:    Medical: No    Non-medical: No  Tobacco Use  . Smoking status: Never Smoker  . Smokeless tobacco: Never Used  . Tobacco comment: smoking cessation materials not required  Substance and Sexual Activity  . Alcohol use: Yes    Alcohol/week: 0.0 standard drinks    Comment: rare; 1 to 2 beers a month  . Drug use: No  . Sexual activity: Not Currently  Lifestyle  . Physical activity:    Days per week: 0 days    Minutes per session: 0 min  . Stress: Not at all  Relationships  . Social connections:    Talks on phone: More than three times a week    Gets together: Twice a week    Attends religious service: More than 4 times per year    Active member of club or organization: No    Attends meetings of clubs or organizations: Never    Relationship status: Living with partner  . Intimate partner violence:    Fear of current or ex partner: No    Emotionally abused: No    Physically abused: No    Forced sexual activity: No  Other Topics Concern  . Not on file  Social History Narrative  . Not on file  Family History  Problem Relation Age of Onset  . Parkinson's disease Mother   . Arthritis Father   . Cancer Brother      Current Outpatient Medications:  .  amLODipine (NORVASC) 10 MG tablet, Take 1 tablet (10 mg total) by mouth daily. Dr [name], Disp: 90 tablet, Rfl: 1 .  aspirin 81 MG tablet, Take 81 mg by mouth daily. Reported on 07/28/2015, Disp: , Rfl:  .  clobetasol cream (TEMOVATE) 0.05 %, , Disp: , Rfl:  .  Cyanocobalamin (VITAMIN B-12) 5000 MCG SUBL, Place 1 tablet under the tongue daily., Disp: , Rfl:  .  Krill Oil 350 MG CAPS, Take 1 capsule by mouth daily., Disp: , Rfl:  .  Multiple Vitamin (MULTIVITAMIN WITH MINERALS) TABS tablet, Take 1 tablet by mouth daily., Disp: , Rfl:  .  mupirocin ointment (BACTROBAN) 2 %, APPLY 1 OINTMENT TOPICALLY TWICE DAILY AS NEEDED, Disp: , Rfl: 1 .  omeprazole (PRILOSEC) 20 MG capsule, TAKE ONE CAPSULE BY MOUTH ONCE DAILY,  Disp: 90 capsule, Rfl: 3 .  triamcinolone cream (KENALOG) 0.1 %, Apply 1 application topically 2 (two) times daily., Disp: 453.6 g, Rfl: 1 .  hydrOXYzine (ATARAX/VISTARIL) 10 MG tablet, Take 10 mg by mouth at bedtime., Disp: , Rfl: 2 .  tamsulosin (FLOMAX) 0.4 MG CAPS capsule, Take 1 capsule (0.4 mg total) by mouth daily. (Patient not taking: Reported on 09/30/2018), Disp: 30 capsule, Rfl: 3  Physical exam:  Vitals:   09/30/18 0908 09/30/18 0912  BP:  (!) 148/75  Pulse:  60  Resp:  16  Temp:  97.7 F (36.5 C)  TempSrc:  Oral  SpO2:  99%  Weight: 191 lb (86.6 kg)    Physical Exam Constitutional:      Appearance: Normal appearance.  HENT:     Head: Normocephalic and atraumatic.  Eyes:     Pupils: Pupils are equal, round, and reactive to light.  Neck:     Musculoskeletal: Normal range of motion.  Cardiovascular:     Rate and Rhythm: Normal rate and regular rhythm.     Heart sounds: Normal heart sounds. No murmur.  Pulmonary:     Effort: Pulmonary effort is normal.     Breath sounds: Normal breath sounds. No wheezing.  Abdominal:     General: Bowel sounds are normal. There is no distension.     Palpations: Abdomen is soft.     Tenderness: There is no abdominal tenderness.  Musculoskeletal: Normal range of motion.  Skin:    General: Skin is warm and dry.     Findings: No rash.  Neurological:     Mental Status: He is alert and oriented to person, place, and time.  Psychiatric:        Judgment: Judgment normal.      CMP Latest Ref Rng & Units 06/14/2018  Glucose 70 - 99 mg/dL 121(H)  BUN 8 - 23 mg/dL 16  Creatinine 0.61 - 1.24 mg/dL 0.93  Sodium 135 - 145 mmol/L 140  Potassium 3.5 - 5.1 mmol/L 3.8  Chloride 98 - 111 mmol/L 107  CO2 22 - 32 mmol/L 26  Calcium 8.9 - 10.3 mg/dL 9.0  Total Protein 6.5 - 8.1 g/dL 6.9  Total Bilirubin 0.3 - 1.2 mg/dL 1.1  Alkaline Phos 38 - 126 U/L 87  AST 15 - 41 U/L 27  ALT 0 - 44 U/L 27   CBC Latest Ref Rng & Units 06/14/2018  WBC  4.0 - 10.5 K/uL 15.0(H)  Hemoglobin  13.0 - 17.0 g/dL 13.9  Hematocrit 39.0 - 52.0 % 41.3  Platelets 150 - 400 K/uL 207    No images are attached to the encounter.  Ct Chest Wo Contrast  Result Date: 09/25/2018 CLINICAL DATA:  85 year old male with a history of pulmonary nodules EXAM: CT CHEST WITHOUT CONTRAST TECHNIQUE: Multidetector CT imaging of the chest was performed following the standard protocol without IV contrast. COMPARISON:  Abdominal CT 06/14/2018, plain film 06/14/2018 FINDINGS: Cardiovascular: Heart size within normal limits. Calcifications of left main, left anterior descending, circumflex, right coronary arteries. No pericardial fluid/thickening. Normal course caliber contour of the thoracic aorta. Calcifications of the aortic arch. Unremarkable diameter of the main pulmonary artery. Mediastinum/Nodes: Small lymph nodes of the mediastinum, not enlarged. Unremarkable thoracic inlet. Unremarkable thoracic esophagus. Hiatal hernia. Lungs/Pleura: No confluent airspace disease. No pneumothorax or pleural effusion. No endotracheal or endobronchial debris. Minimal bronchiectasis. Mild scarring at the apices. Scarring/atelectasis of the right middle lobe. 5 mm nodule of the lingula on image 113 of series 3. 8 mm nodule of the left lower lobe, unchanged, image 93 of series 3. 7 mm nodule of the right lower lobe, unchanged from prior, image 120 of series 3. Upper Abdomen: No acute finding of the upper abdomen. Cholelithiasis. Musculoskeletal: Redemonstration of benign right renal cyst. IMPRESSION: No acute finding. Redemonstration of small nodules of the lingula, left lower lobe, right lower lobe. Non-contrast chest CT at 6-12 months is recommended. If the nodule is stable at time of repeat CT, then future CT at 18-24 months (from today's scan) is considered optional for low-risk patients, but is recommended for high-risk patients. This recommendation follows the consensus statement: Guidelines for  Management of Incidental Pulmonary Nodules Detected on CT Images: From the Fleischner Society 2017; Radiology 2017; 284:228-243. Aortic atherosclerosis with associated left main and 3 vessel coronary artery disease. Aortic Atherosclerosis (ICD10-I70.0). Hiatal hernia. Electronically Signed   By: Corrie Mckusick D.O.   On: 09/25/2018 08:28   Plan- Patient is a 85 y.o. male who presents for initial visit to pulmonary lung nodule clinic and to receive results from CT scan.   Incidental lung nodule discovered by imaging in November 2019 during an evaluation at the [facility] for increased urinary frequency and fatigue resulting in imaging. Multiple nodules identified including a 9 mm left lower lobe nodule.  We reviewed both images together comparing scans.  CT scan from 09/24/2018 redemonstrated small nodules of the left and right lower lobes (5 mm nodule of the lingula, 8 mm nodule of the LLL, 7 mm nodule of the RLL). They appeared unchanged and/or slightly smaller in size from previous.  Disposition: Repeat noncontrast CT in 6 to 12 months. RTC a few days later for results of scan, assessment and further recommendation for monitoring of lung nodules.   Calculating malignancy probability of a pulmonary nodule: Risk factors include: 1.  Age. 2.  Cancer history.   3.  Diameter of the solitary pulmonary nodule in mm 4.  Location 5.  Smoking history 6.  Spiculation present  Based on pulmonary nodule malignancy risk factors, this patient is of low risk for the development of lung cancer. I would recommend a 6 month follow-up with imaging to ensure stability given there are multiple nodules and they are new. Patient would prefer a 9 to 46-month follow-up.  Will schedule noncontrast CT of chest in approximately 10 months (approximately one year from initial discovery).  He will return to clinic few days later for results.  During our visit, we discussed pulmonary nodules are a common incidental  finding and are often how lung cancer is discovered.  Lung cancer survival is directly related to the stage at diagnosis.  We discussed that nodules can vary in presentation from solitary pulmonary nodules to masses, to groundglass opacities and multiple nodules.  Pulmonary nodules in the majority of cases are benign but the probability of these becoming malignant cannot be undermined.  Early identification of malignant nodules could lead to early diagnosis and increased survival.  We discussed the probability of pulmonary nodules becoming malignant increases with age, pack-years of tobacco use, size/characteristics of the nodule and location; with upper lobe involvement being most worrisome.  We discussed the goal of our clinic is to thoroughly evaluate each nodule, developed a comprehensive, individualized plan of care utilizing the most advanced technology and significantly reduce the time from detection to treatment.  A dedicated pulmonary nodule clinic has proven to indeed expedite the detection and treatment of lung cancer.   Patient education and fact sheet provided along with most recent CT scans.  Visit Diagnosis 1. Lung nodule     Patient expressed understanding and was in agreement with this plan. He also understands that He can call clinic at any time with any questions, concerns, or complaints.   Greater than 50% was spent in counseling and coordination of care with this patient including but not limited to discussion of the relevant topics above (See A&P) including, but not limited to diagnosis and management of acute and chronic medical conditions.   Thank you for allowing me to participate in the care of this very pleasant patient.    Jacquelin Hawking, NP [place] [facility] Cell - [phone] Pager- [phone] 10/02/2018 2:01 PM    CC: Dr. Otilio Miu

## 2018-10-16 ENCOUNTER — Other Ambulatory Visit: Payer: Self-pay

## 2018-10-16 DIAGNOSIS — N400 Enlarged prostate without lower urinary tract symptoms: Secondary | ICD-10-CM

## 2018-10-18 ENCOUNTER — Ambulatory Visit: Payer: Medicare HMO | Admitting: Urology

## 2018-10-18 ENCOUNTER — Other Ambulatory Visit: Payer: Self-pay

## 2018-10-18 ENCOUNTER — Encounter: Payer: Self-pay | Admitting: Urology

## 2018-10-18 ENCOUNTER — Other Ambulatory Visit
Admission: RE | Admit: 2018-10-18 | Discharge: 2018-10-18 | Disposition: A | Discharge status: Home / Self Care | Payer: Medicare HMO | Attending: Urology | Admitting: Urology

## 2018-10-18 VITALS — BP 172/72 | HR 77 | Ht 69.0 in | Wt 190.0 lb

## 2018-10-18 DIAGNOSIS — N400 Enlarged prostate without lower urinary tract symptoms: Secondary | ICD-10-CM

## 2018-10-18 DIAGNOSIS — N281 Cyst of kidney, acquired: Secondary | ICD-10-CM | POA: Diagnosis not present

## 2018-10-18 LAB — URINALYSIS, COMPLETE (UACMP) WITH MICROSCOPIC
Bacteria, UA: NONE SEEN
Glucose, UA: NEGATIVE mg/dL
Hgb urine dipstick: NEGATIVE
Leukocytes,Ua: NEGATIVE
Nitrite: NEGATIVE
Protein, ur: NEGATIVE mg/dL
RBC / HPF: NONE SEEN RBC/hpf (ref 0–5)
Specific Gravity, Urine: 1.02 (ref 1.005–1.030)
Squamous Epithelial / HPF: NONE SEEN (ref 0–5)
pH: 5 (ref 5.0–8.0)

## 2018-10-18 LAB — BLADDER SCAN AMB NON-IMAGING

## 2018-10-18 NOTE — Progress Notes (Signed)
10/18/2018 9:33 AM   Marco Sims [birth year] [id number]  Referring provider: [name], MD [address] [place], [place] [address]  Chief Complaint  Patient presents with  . Benign Prostatic Hypertrophy    HPI: 85 year old male referred for further evaluation of BPH with urinary symptoms.  He was prescribed on Flomax 0.4 mg by his primary care physician, Dr. [name] on 09/17/2038 after presenting with irritative voiding symptoms.  He reports that he mentioned at the time that when he is at home sitting in front of his computer, he is to get up and use the restroom more frequently.  He reports that this was an offhand comment.  He is able to hold it for many hours when he is out and about doing things.  Overall, he feels like he has minimal urinary symptoms.  IPSS as below.  Patient never started Flomax.  No history of UTIs or bladder stones.  Notably, incidental enlargement of his prostate was noted on CT scan on 11/19.  There is notable mass-effect into the base of the bladder.  He also has an incidental 10 cm right renal cyst, simple.  He denies any flank pain.  No bladder wall thickening appreciated.  Most recent PSA 2.6 on 09/17/2018.  IPSS    Row Name 10/18/18 0900         International Prostate Symptom Score   How often have you had the sensation of not emptying your bladder?  Less than 1 in 5     How often have you had to urinate less than every two hours?  Less than half the time     How often have you found you stopped and started again several times when you urinated?  Less than half the time     How often have you found it difficult to postpone urination?  Not at All     How often have you had a weak urinary stream?  Not at All     How often have you had to strain to start urination?  Not at All     How many times did you typically get up at night to urinate?  1 Time     Total IPSS Score  6       Quality of Life due to urinary symptoms   If you  were to spend the rest of your life with your urinary condition just the way it is now how would you feel about that?  Mixed        Score:  1-7 Mild 8-19 Moderate 20-35 Severe     PMH: Past Medical History:  Diagnosis Date  . Allergy    seaasonal  . Anemia   . Colon polyp   . GERD (gastroesophageal reflux disease)   . Hypertension   . Lichen simplex chronicus   . Wears hearing aid in both ears     Surgical History: Past Surgical History:  Procedure Laterality Date  . Colles' fracture right 1956    . COLONOSCOPY  2013  . COLONOSCOPY WITH PROPOFOL N/A 07/11/2018   Procedure: COLONOSCOPY WITH PROPOFOL;  Surgeon: Lucilla Lame, MD;  Location: [facility];  Service: Endoscopy;  Laterality: N/A;  . FRACTURE SURGERY    . fractured left arm radius and  ulna 1955    . fractured right tibiofibular 2009    . HAND SURGERY  06/02/11  . HERNIA REPAIR Right 2010, 2015  . HERNIA REPAIR Left 2015  .  herniorpahy, rt  05/09/2009  . NERVE REPAIR Right 08/12/2015   Procedure: EXPLORATION REPAIR  NERVE, DEBRIDEMENT FRACTURE RIGHT INDEX MIDDLE FINGER;  Surgeon: Daryll Brod, MD;  Location: [facility];  Service: Orthopedics;  Laterality: Right;  . POLYPECTOMY  07/11/2018   Procedure: POLYPECTOMY;  Surgeon: Lucilla Lame, MD;  Location: [facility];  Service: Endoscopy;;  . ruptured left Achilles tendon 1980    . surgery for cubital tunnel syndrome and left arm 1999    . tonsillectomy 1942      Home Medications:  Allergies as of 10/18/2018   No Known Allergies     Medication List       Accurate as of October 18, 2018  9:33 AM. Always use your most recent med list.        amLODipine 10 MG tablet Commonly known as:  NORVASC Take 1 tablet (10 mg total) by mouth daily. Dr [name]   aspirin 81 MG tablet Take 81 mg by mouth daily. Reported on 07/28/2015   clobetasol cream 0.05 % Commonly known as:  TEMOVATE   hydrOXYzine 10 MG tablet Commonly known as:   ATARAX/VISTARIL Take 10 mg by mouth at bedtime.   Krill Oil 350 MG Caps Take 1 capsule by mouth daily.   multivitamin with minerals Tabs tablet Take 1 tablet by mouth daily.   mupirocin ointment 2 % Commonly known as:  BACTROBAN APPLY 1 OINTMENT TOPICALLY TWICE DAILY AS NEEDED   omeprazole 20 MG capsule Commonly known as:  PRILOSEC TAKE ONE CAPSULE BY MOUTH ONCE DAILY   triamcinolone cream 0.1 % Commonly known as:  KENALOG Apply 1 application topically 2 (two) times daily.   Vitamin B-12 5000 MCG Subl Place 1 tablet under the tongue daily.       Allergies: No Known Allergies  Family History: Family History  Problem Relation Age of Onset  . Parkinson's disease Mother   . Arthritis Father   . Cancer Brother     Social History:  reports that he has never smoked. He has never used smokeless tobacco. He reports current alcohol use. He reports that he does not use drugs.  ROS: UROLOGY Frequent Urination?: No Hard to postpone urination?: No Burning/pain with urination?: No Get up at night to urinate?: No Leakage of urine?: No Urine stream starts and stops?: No Trouble starting stream?: No Do you have to strain to urinate?: No Blood in urine?: No Urinary tract infection?: No Sexually transmitted disease?: No Injury to kidneys or bladder?: No Painful intercourse?: No Weak stream?: No Erection problems?: No Penile pain?: No  Gastrointestinal Nausea?: No Vomiting?: No Indigestion/heartburn?: No Diarrhea?: No Constipation?: No  Constitutional Fever: No Night sweats?: No Weight loss?: No Fatigue?: No  Skin Skin rash/lesions?: No Itching?: No  Eyes Blurred vision?: No Double vision?: No  Ears/Nose/Throat Sore throat?: No Sinus problems?: No  Hematologic/Lymphatic Swollen glands?: No Easy bruising?: No  Cardiovascular Leg swelling?: No Chest pain?: No  Respiratory Cough?: No Shortness of breath?: No  Endocrine Excessive thirst?: No   Musculoskeletal Back pain?: No Joint pain?: No  Neurological Headaches?: No Dizziness?: No  Psychologic Depression?: No Anxiety?: No  Physical Exam: BP (!) 172/72 (BP Location: Left Arm, Patient Position: Sitting)   Pulse 77   Ht 5\' 9"  (1.753 m)   Wt 190 lb (86.2 kg)   BMI 28.06 kg/m   Constitutional:  Alert and oriented, No acute distress. HEENT: [place] AT, moist mucus membranes.  Trachea midline, no masses. Cardiovascular: No clubbing,  cyanosis, or edema. Respiratory: Normal respiratory effort, no increased work of breathing. GI: Abdomen is soft, nontender, nondistended, no abdominal masses Skin: No rashes, bruises or suspicious lesions. Neurologic: Grossly intact, no focal deficits, moving all 4 extremities. Psychiatric: Normal mood and affect.  Laboratory Data: Lab Results  Component Value Date   WBC 15.0 (H) 06/14/2018   HGB 13.9 06/14/2018   HCT 41.3 06/14/2018   MCV 85.5 06/14/2018   PLT 207 06/14/2018    Lab Results  Component Value Date   CREATININE 0.93 06/14/2018    Urinalysis Component     Latest Ref Rng & Units 10/18/2018  Color, Urine     YELLOW AMBER (A)  Appearance     CLEAR CLEAR  Specific Gravity, Urine     1.005 - 1.030 1.020  pH     5.0 - 8.0 5.0  Glucose, UA     NEGATIVE mg/dL NEGATIVE  Hgb urine dipstick     NEGATIVE NEGATIVE  Bilirubin Urine     NEGATIVE SMALL (A)  Ketones, ur     NEGATIVE mg/dL TRACE (A)  Protein     NEGATIVE mg/dL NEGATIVE  Nitrite     NEGATIVE NEGATIVE  Leukocytes,Ua     NEGATIVE NEGATIVE  Squamous Epithelial / LPF     0 - 5 NONE SEEN  WBC, UA     0 - 5 WBC/hpf 0-5  RBC / HPF     0 - 5 RBC/hpf NONE SEEN  Bacteria, UA     NONE SEEN NONE SEEN  Mucus      PRESENT  Ca Oxalate Crys, UA      PRESENT    Pertinent Imaging: PVR 26 cc  Assessment & Plan:    1. Benign prostatic hyperplasia without lower urinary tract symptoms Significant prostamegaly on CT scan with minimal urinary symptoms, overall  pleased with voiding Adequate bladder emptying today UA negative Patient is overall satisfied and does not wish to have any further intervention for his urinary symptoms.  He is in fact not taken his BPH medications prescribed by his primary care as he felt that they were not needed If he does need medications at some point for worsening urinary symptoms, would favor finasteride based on the size of his gland Most recent PSA low, appropriate for age, beyond age for screening - BLADDER SCAN AMB NON-IMAGING  2. Acquired renal cyst of right kidney [place] incidental right renal cyst, benign-appearing Asymptomatic No further intervention   Return if symptoms worsen or fail to improve.  Hollice Espy, MD  [facility] [address], [place], [place] [address] [phone]

## 2018-11-06 ENCOUNTER — Encounter: Payer: Self-pay | Admitting: Family Medicine

## 2018-11-06 ENCOUNTER — Other Ambulatory Visit: Payer: Self-pay

## 2018-11-06 ENCOUNTER — Ambulatory Visit (INDEPENDENT_AMBULATORY_CARE_PROVIDER_SITE_OTHER): Payer: Medicare HMO | Admitting: Family Medicine

## 2018-11-06 VITALS — BP 120/80 | HR 80 | Ht 69.0 in | Wt 191.0 lb

## 2018-11-06 DIAGNOSIS — R351 Nocturia: Secondary | ICD-10-CM | POA: Diagnosis not present

## 2018-11-06 DIAGNOSIS — N401 Enlarged prostate with lower urinary tract symptoms: Secondary | ICD-10-CM

## 2018-11-06 MED ORDER — FINASTERIDE 5 MG PO TABS: 5.0000 mg | ORAL_TABLET | Freq: Every day | ORAL | 1 refills | Status: DC

## 2018-11-06 NOTE — Patient Instructions (Signed)
Finasteride (Proscar) tablets What is this medicine? FINASTERIDE (fi NAS teer ide) is used to treat benign prostatic hyperplasia (BPH) in men. This is a condition that causes you to have an enlarged prostate. This medicine helps to control your symptoms, decrease urinary retention, and reduces your risk of needing surgery. When used in combination with certain other medicines, this drug can slow down the progression of your disease. This medicine may be used for other purposes; ask your health care provider or pharmacist if you have questions. COMMON BRAND NAME(S): Proscar What should I tell my health care provider before I take this medicine? They need to know if you have any of these conditions: -liver disease -an unusual or allergic reaction to finasteride, other medicines, foods, dyes, or preservatives -pregnant or trying to get pregnant -breast-feeding How should I use this medicine? Take this medicine by mouth with a glass of water. Follow the directions on the prescription label. You can take this medicine with or without food. Take your doses at regular intervals. Do not take your medicine more often than directed. Do not stop taking except on the advice of your doctor or health care professional. Talk to your pediatrician regarding the use of this medicine in children. Special care may be needed. Overdosage: If you think you have taken too much of this medicine contact a poison control center or emergency room at once. NOTE: This medicine is only for you. Do not share this medicine with others. What if I miss a dose? If you miss a dose, take it as soon as you can. If it is almost time for your next dose, take only that dose. Do not take double or extra doses. What may interact with this medicine? -saw palmetto or other dietary supplements This list may not describe all possible interactions. Give your health care provider a list of all the medicines, herbs, non-prescription drugs, or  dietary supplements you use. Also tell them if you smoke, drink alcohol, or use illegal drugs. Some items may interact with your medicine. What should I watch for while using this medicine? Do not donate blood while you are taking this medicine. This will prevent giving this medicine to a pregnant male through a blood transfusion. Ask your doctor or health care professional when it is safe to donate blood after you stop taking this medicine. Women who are pregnant or may get pregnant must not handle broken or crushed finasteride tablets. The active ingredient could harm the unborn baby. If a pregnant woman comes into contact with broken or crushed tablets she should check with her doctor or health care professional. Exposure to whole tablets is not expected to cause harm as long as they are not swallowed. Contact your doctor or health care professional if your symptoms do not start to get better. You may need to take this medicine for 6 to 12 months to get the best results. This medicine can interfere with PSA laboratory tests for prostate cancer. If you are scheduled to have a lab test for prostate cancer, tell your doctor or health care professional that you are taking this medicine. This medicine may increase your risk of getting some cancers, like breast cancer. Talk with your doctor. What side effects may I notice from receiving this medicine? Side effects that you should report to your doctor or health care professional as soon as possible: -any signs of an allergic reaction like rash, itching, hives or swelling of the lips or face -changes in breast like  lumps, pain or fluids leaking from the nipple -pain in the testicles Side effects that usually do not require medical attention (report to your doctor or health care professional if they continue or are bothersome): -sexual difficulties like decreased sexual desire or ability to get an erection -small amount of semen released during sex This  list may not describe all possible side effects. Call your doctor for medical advice about side effects. You may report side effects to FDA at 1-800-FDA-1088. Where should I keep my medicine? Keep out of the reach of children. Store at room temperature below 30 degrees C (86 degrees F). Protect from light. Keep container tightly closed. Throw away any unused medicine after the expiration date. NOTE: This sheet is a summary. It may not cover all possible information. If you have questions about this medicine, talk to your doctor, pharmacist, or health care provider.  2019 Elsevier/Gold Standard (2015-03-11 17:24:30)

## 2018-11-06 NOTE — Progress Notes (Signed)
Date:  11/06/2018   Name:  Marco Sims   DOB:  [birth year]   MRN:  [id number]   Chief Complaint: Follow-up (was taking Tamsulosin but expressed to Dr Erlene Quan that he was "fine" off the medication. She kept him off the med)  Benign Prostatic Hypertrophy  This is a chronic problem. The current episode started more than 1 year ago. The problem has been waxing and waning since onset. Irritative symptoms include frequency and nocturia. Irritative symptoms do not include urgency. Obstructive symptoms do not include dribbling, incomplete emptying, an intermittent stream, a slower stream, straining or a weak stream. Pertinent negatives include no chills, dysuria, genital pain, hematuria, hesitancy, nausea or vomiting. Nothing aggravates the symptoms. Past treatments include tamsulosin (previously on tamulosin). The treatment provided mild relief.    Review of Systems  Constitutional: Negative for chills and fever.  HENT: Negative for drooling, ear discharge, ear pain and sore throat.   Respiratory: Negative for cough, shortness of breath and wheezing.   Cardiovascular: Negative for chest pain, palpitations and leg swelling.  Gastrointestinal: Negative for abdominal pain, blood in stool, constipation, diarrhea, nausea and vomiting.  Endocrine: Negative for polydipsia.  Genitourinary: Positive for frequency and nocturia. Negative for dysuria, hematuria, hesitancy, incomplete emptying and urgency.  Musculoskeletal: Negative for back pain, myalgias and neck pain.  Skin: Negative for rash.  Allergic/Immunologic: Negative for environmental allergies.  Neurological: Negative for dizziness and headaches.  Hematological: Does not bruise/bleed easily.  Psychiatric/Behavioral: Negative for suicidal ideas. The patient is not nervous/anxious.     Patient Active Problem List   Diagnosis Date Noted  . Hematochezia   . Benign neoplasm of ascending colon   . Essential hypertension 01/09/2018  . GERD  11/04/2009  . ERECTILE DYSFUNCTION 07/28/2008  . DERMATITIS 07/28/2008  . LABYRINTHITIS 11/05/2007  . HYPERLIPIDEMIA 09/24/2007  . ANEMIA 09/24/2007  . CONDUCTIVE HEARING LOSS UNILATERAL 09/24/2007  . ARTHRITIS 09/24/2007  . UNS ADVRS EFF OTH RX MEDICINAL&BIOLOGICAL SBSTNC 09/24/2007    No Known Allergies  Past Surgical History:  Procedure Laterality Date  . Colles' fracture right 1956    . COLONOSCOPY  2013  . COLONOSCOPY WITH PROPOFOL N/A 07/11/2018   Procedure: COLONOSCOPY WITH PROPOFOL;  Surgeon: Lucilla Lame, MD;  Location: [place] [facility];  Service: Endoscopy;  Laterality: N/A;  . FRACTURE SURGERY    . fractured left arm radius and  ulna 1955    . fractured right tibiofibular 2009    . HAND SURGERY  06/02/11  . HERNIA REPAIR Right 2010, 2015  . HERNIA REPAIR Left 2015  . herniorpahy, rt  05/09/2009  . NERVE REPAIR Right 08/12/2015   Procedure: EXPLORATION REPAIR  NERVE, DEBRIDEMENT FRACTURE RIGHT INDEX MIDDLE FINGER;  Surgeon: Daryll Brod, MD;  Location: [place];  Service: Orthopedics;  Laterality: Right;  . POLYPECTOMY  07/11/2018   Procedure: POLYPECTOMY;  Surgeon: Lucilla Lame, MD;  Location: [place];  Service: Endoscopy;;  . ruptured left Achilles tendon 1980    . surgery for cubital tunnel syndrome and left arm 1999    . tonsillectomy 1942      Social History   Tobacco Use  . Smoking status: Never Smoker  . Smokeless tobacco: Never Used  . Tobacco comment: smoking cessation materials not required  Substance Use Topics  . Alcohol use: Yes    Alcohol/week: 0.0 standard drinks    Comment: rare; 1 to 2 beers a month  . Drug use: No     Medication  list has been reviewed and updated.  Current Meds  Medication Sig  . amLODipine (NORVASC) 10 MG tablet Take 1 tablet (10 mg total) by mouth daily. Dr [name]  . aspirin 81 MG tablet Take 81 mg by mouth daily. Reported on 07/28/2015  . clobetasol cream (TEMOVATE) 0.05 %   .  Cyanocobalamin (VITAMIN B-12) 5000 MCG SUBL Place 1 tablet under the tongue daily.  Javier Docker Oil 350 MG CAPS Take 1 capsule by mouth daily.  . mupirocin ointment (BACTROBAN) 2 % APPLY 1 OINTMENT TOPICALLY TWICE DAILY AS NEEDED  . omeprazole (PRILOSEC) 20 MG capsule TAKE ONE CAPSULE BY MOUTH ONCE DAILY  . triamcinolone cream (KENALOG) 0.1 % Apply 1 application topically 2 (two) times daily.    PHQ 2/9 Scores 09/11/2018 01/09/2018 08/30/2017 06/13/2016  PHQ - 2 Score 0 0 0 0  PHQ- 9 Score - 0 - -    BP Readings from Last 3 Encounters:  11/06/18 120/80  10/18/18 (!) 172/72  09/30/18 (!) 148/75    Physical Exam Vitals signs reviewed.  HENT:     Head: Normocephalic.     Right Ear: External ear normal.     Left Ear: External ear normal.     Nose: Nose normal.  Eyes:     General: No scleral icterus.       Right eye: No discharge.        Left eye: No discharge.     Conjunctiva/sclera: Conjunctivae normal.     Pupils: Pupils are equal, round, and reactive to light.  Neck:     Musculoskeletal: Normal range of motion and neck supple.     Thyroid: No thyromegaly.     Vascular: No JVD.     Trachea: No tracheal deviation.  Cardiovascular:     Rate and Rhythm: Normal rate and regular rhythm.     Heart sounds: Normal heart sounds. No murmur. No friction rub. No gallop.   Pulmonary:     Effort: No respiratory distress.     Breath sounds: Normal breath sounds. No wheezing or rales.  Abdominal:     General: Bowel sounds are normal.     Palpations: Abdomen is soft. There is no mass.     Tenderness: There is no abdominal tenderness. There is no guarding or rebound.  Musculoskeletal: Normal range of motion.        General: No tenderness.  Lymphadenopathy:     Cervical: No cervical adenopathy.  Skin:    General: Skin is warm.     Findings: No rash.  Neurological:     Mental Status: He is alert and oriented to person, place, and time.     Cranial Nerves: No cranial nerve deficit.     Deep  Tendon Reflexes: Reflexes are normal and symmetric.     Wt Readings from Last 3 Encounters:  11/06/18 191 lb (86.6 kg)  10/18/18 190 lb (86.2 kg)  09/30/18 191 lb (86.6 kg)    BP 120/80   Pulse 80   Ht 5\' 9"  (1.753 m)   Wt 191 lb (86.6 kg)   BMI 28.21 kg/m   Assessment and Plan:  1. Benign prostatic hyperplasia with nocturia Patient returns today for follow-up of BPH with nocturia.  Patient has had no decrease in stream.  We discussed various means of treatment.  And understands how finasteride works and would like to trial this.  Finasteride 5 mg once a day was prescribed.  Patient was given multiple refills.

## 2018-12-20 DIAGNOSIS — N400 Enlarged prostate without lower urinary tract symptoms: Secondary | ICD-10-CM | POA: Diagnosis not present

## 2018-12-20 DIAGNOSIS — Z9181 History of falling: Secondary | ICD-10-CM | POA: Diagnosis not present

## 2018-12-20 DIAGNOSIS — K219 Gastro-esophageal reflux disease without esophagitis: Secondary | ICD-10-CM | POA: Diagnosis not present

## 2018-12-20 DIAGNOSIS — Z7982 Long term (current) use of aspirin: Secondary | ICD-10-CM | POA: Diagnosis not present

## 2018-12-20 DIAGNOSIS — I10 Essential (primary) hypertension: Secondary | ICD-10-CM | POA: Diagnosis not present

## 2018-12-20 DIAGNOSIS — J309 Allergic rhinitis, unspecified: Secondary | ICD-10-CM | POA: Diagnosis not present

## 2019-02-01 ENCOUNTER — Other Ambulatory Visit: Payer: Self-pay | Admitting: Family Medicine

## 2019-02-01 DIAGNOSIS — I10 Essential (primary) hypertension: Secondary | ICD-10-CM

## 2019-02-07 ENCOUNTER — Other Ambulatory Visit: Payer: Self-pay | Admitting: Family Medicine

## 2019-02-07 DIAGNOSIS — I10 Essential (primary) hypertension: Secondary | ICD-10-CM

## 2019-02-12 DIAGNOSIS — L280 Lichen simplex chronicus: Secondary | ICD-10-CM | POA: Diagnosis not present

## 2019-02-17 ENCOUNTER — Telehealth: Payer: Self-pay | Admitting: Family Medicine

## 2019-02-17 NOTE — Telephone Encounter (Signed)
Patient requesting refills for :  amLODipine (NORVASC) 10 MG tablet [[id number]]   Order Details Dose: 10 mg Route: Oral Frequency: Daily  Dispense Quantity: 90 tablet Refills: 1 Fills remaining: --        Sig: Take 1 tablet (10 mg total) by mouth daily. Dr [name]       Written Date: 01/09/18 Expiration Date: 01/09/19    Start Date: 01/09/18 End Date: 10/16/19 after 645 doses         Ordering Provider: -- DEA #:  -- NPI:  --   Authorizing Provider: [name], MD DEA #:  [id number] NPI:  [id number]   Ordering User:  Marco Patch, MD          Diagnosis Association: Essential hypertension (I10)    Original Order:  amLODipine (NORVASC) 10 MG tablet [[id number]]    Pharmacy:  [facility], [place] #:  --  Pharmacy Comments: --       Fill quantity remaining: -- Fill quantity used: -- Next fill due: --

## 2019-02-18 ENCOUNTER — Other Ambulatory Visit: Payer: Self-pay

## 2019-02-18 DIAGNOSIS — I10 Essential (primary) hypertension: Secondary | ICD-10-CM

## 2019-02-18 MED ORDER — AMLODIPINE BESYLATE 10 MG PO TABS: 10.0000 mg | ORAL_TABLET | Freq: Every day | ORAL | 0 refills | Status: DC

## 2019-02-19 DIAGNOSIS — E039 Hypothyroidism, unspecified: Secondary | ICD-10-CM | POA: Diagnosis not present

## 2019-02-19 DIAGNOSIS — E785 Hyperlipidemia, unspecified: Secondary | ICD-10-CM | POA: Diagnosis not present

## 2019-02-19 DIAGNOSIS — K219 Gastro-esophageal reflux disease without esophagitis: Secondary | ICD-10-CM | POA: Diagnosis not present

## 2019-02-19 DIAGNOSIS — I10 Essential (primary) hypertension: Secondary | ICD-10-CM | POA: Diagnosis not present

## 2019-04-04 ENCOUNTER — Telehealth: Payer: Self-pay | Admitting: *Deleted

## 2019-04-04 NOTE — Telephone Encounter (Signed)
Left message with patient to call back to review upcoming appts in the [facility] including follow up CT scan and visit with Rulon Abide, NP to discuss results. Awaiting call back.

## 2019-05-08 ENCOUNTER — Other Ambulatory Visit: Payer: Self-pay | Admitting: Family Medicine

## 2019-05-08 DIAGNOSIS — K219 Gastro-esophageal reflux disease without esophagitis: Secondary | ICD-10-CM

## 2019-05-21 ENCOUNTER — Telehealth: Payer: Self-pay | Admitting: *Deleted

## 2019-05-21 NOTE — Telephone Encounter (Signed)
Pt has been made aware of upcoming appts for follow up CT scan and follow up appt with Jennifer Burns, NP in the [facility]. Pt verbalized understanding. Nothing further needed at this time. 

## 2019-05-24 DIAGNOSIS — R69 Illness, unspecified: Secondary | ICD-10-CM | POA: Diagnosis not present

## 2019-07-23 ENCOUNTER — Other Ambulatory Visit: Payer: Self-pay

## 2019-07-23 ENCOUNTER — Ambulatory Visit
Admission: RE | Admit: 2019-07-23 | Discharge: 2019-07-23 | Disposition: A | Discharge status: Home / Self Care | Payer: Medicare HMO | Source: Ambulatory Visit | Attending: Oncology | Admitting: Oncology

## 2019-07-23 DIAGNOSIS — R918 Other nonspecific abnormal finding of lung field: Secondary | ICD-10-CM | POA: Diagnosis not present

## 2019-07-23 DIAGNOSIS — R911 Solitary pulmonary nodule: Secondary | ICD-10-CM | POA: Diagnosis not present

## 2019-07-24 ENCOUNTER — Inpatient Hospital Stay: Admission: RE | Admit: 2019-07-24 | Payer: Medicare HMO | Source: Ambulatory Visit

## 2019-07-24 ENCOUNTER — Other Ambulatory Visit: Payer: Self-pay

## 2019-07-24 ENCOUNTER — Inpatient Hospital Stay: Payer: Medicare HMO | Attending: Oncology | Admitting: Oncology

## 2019-07-24 DIAGNOSIS — R911 Solitary pulmonary nodule: Secondary | ICD-10-CM | POA: Insufficient documentation

## 2019-07-24 DIAGNOSIS — I10 Essential (primary) hypertension: Secondary | ICD-10-CM | POA: Diagnosis not present

## 2019-07-24 DIAGNOSIS — Z86010 Personal history of colonic polyps: Secondary | ICD-10-CM | POA: Insufficient documentation

## 2019-07-24 NOTE — Progress Notes (Signed)
Pulmonary Nodule Clinic Consult note [facility]  Telephone:([phone] [phone]  Patient Care Team: [name], MD as PCP - General (Family Medicine) Corey Skains, MD as Consulting Physician (Cardiology)   Name of the patient: Marco Sims  [id number]  02-Apr-1934   Date of visit: 07/24/2019   Diagnosis- Incidental Lung Nodule   Chief complaint/ Reason for visit- Follow-up visit to pulmonary nodule clinic  PMH history: Patient referred by PCP Dr. [name] to our clinic for recommendations and follow-up of incidental lung nodule findings discovered on 06/14/2018. Imaging reveaed multiple bilateral lung base nodules with largest in left lower lobe measuring 9 mm not clearly seen on comparison CT.  Comparison imaging from 06/07/2012 revealed multiple nodular densities in the lung bases that were nonspecific but suggestive of possible areas of granulomatosis disease or other benign etiology. No further imaging available.   He was evaluated in the pulmonary nodule clinic on 09/24/2018 to review follow-up scans. Ct scan revealed nodules of the left and lower lower lobes (5 mm nodule of the lingula, 8 mm nodule of the LLL, 7 mm nodule of the right LL).  They appeared unchanged and/or slightly smaller in size from previous.  It was recommended he return to clinic in approximately 6 to 12 months for repeat imaging.  Interval history- Patient is a non-smoker; has never smoked.  Denies any occupational exposure to asbestos, radium or uranium.  Denies any family history of lung cancer.  Denies any known history of lung disease. Denies previous personal or family history of malignancy.   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    Occupation: Working with [employer].  Exposure to sawdust.  Clydene Laming dust is considered a group 1 carcinogen and responsible for up to 27% of lung cancers in men.  I explained to Mr. Rammel that lung cancer is multifactorial and sawdust  alone is less likely  to cause lung cancer.  We discussed other occupational substances such as diesel fumes, natural fibers such as asbestos, silica and wood dust, metals including aluminum, arsenic and beryllium, radon, reactive chemicals, secondhand smoke and solvents like benzene and Toluene.  Most common occupations associated with the development of lung cancer include painters, Dispensing optician work, metal work, Tourist information centre manager, Conservation officer, historic buildings, truck driving, Chemical engineer, Press photographer, aluminum Engineer, civil (consulting).  He has past medical history of essential hypertension, GERD, colon polyp, conductive hearing loss, arthritis, hyperlipidemia, anemia and dementia.    In the interim, he continues to do well.  He denies any recent fevers or illness.  His appetite is good.  He is retired and works with wood at home.  He denies any weight loss and maintains a good appetite.  He denies chest pain, nausea, vomiting, constipation or diarrhea. Has history of BPH with intermittent urinary retention. On flomax.  Denies any respiratory concerns including shortness of breath, cough, wheezing or hemoptysis.  ECOG FS:1 - Symptomatic but completely ambulatory  Review of systems- Review of Systems  Constitutional: Positive for malaise/fatigue. Negative for chills, fever and weight loss.  HENT: Negative for congestion, ear pain and tinnitus.   Eyes: Negative.  Negative for blurred vision and double vision.  Respiratory: Negative.  Negative for cough, sputum production and shortness of breath.   Cardiovascular: Negative.  Negative for chest pain, palpitations and leg swelling.  Gastrointestinal: Negative.  Negative for abdominal pain, constipation, diarrhea, nausea and vomiting.  Genitourinary: Positive for frequency. Negative for dysuria and urgency.  Musculoskeletal: Negative  for back pain and falls.  Skin: Negative.  Negative for rash.  Neurological: Negative.  Negative for weakness and headaches.   Endo/Heme/Allergies: Negative.  Does not bruise/bleed easily.  Psychiatric/Behavioral: Negative.  Negative for depression. The patient is not nervous/anxious and does not have insomnia.     No Known Allergies   Past Medical History:  Diagnosis Date  . Allergy    seaasonal  . Anemia   . Colon polyp   . GERD (gastroesophageal reflux disease)   . Hypertension   . Lichen simplex chronicus   . Wears hearing aid in both ears      Past Surgical History:  Procedure Laterality Date  . Colles' fracture right 1956    . COLONOSCOPY  2013  . COLONOSCOPY WITH PROPOFOL N/A 07/11/2018   Procedure: COLONOSCOPY WITH PROPOFOL;  Surgeon: Lucilla Lame, MD;  Location: [place];  Service: Endoscopy;  Laterality: N/A;  . FRACTURE SURGERY    . fractured left arm radius and  ulna 1955    . fractured right tibiofibular 2009    . HAND SURGERY  06/01/82  . HERNIA REPAIR Right 2010, 2015  . HERNIA REPAIR Left 2015  . herniorpahy, rt  05/09/2009  . NERVE REPAIR Right 08/12/2015   Procedure: EXPLORATION REPAIR  NERVE, DEBRIDEMENT FRACTURE RIGHT INDEX MIDDLE FINGER;  Surgeon: Daryll Brod, MD;  Location: [place];  Service: Orthopedics;  Laterality: Right;  . POLYPECTOMY  07/11/2018   Procedure: POLYPECTOMY;  Surgeon: Lucilla Lame, MD;  Location: [place];  Service: Endoscopy;;  . ruptured left Achilles tendon 1980    . surgery for cubital tunnel syndrome and left arm 1999    . tonsillectomy 1942      Social History   Socioeconomic History  . Marital status: Soil scientist    Spouse name: Not on file  . Number of children: 3  . Years of education: Not on file  . Highest education level: Bachelor's degree (e.g., BA, AB, BS)  Occupational History  . Occupation: Retired  Tobacco Use  . Smoking status: Never Smoker  . Smokeless tobacco: Never Used  . Tobacco comment: smoking cessation materials not required  Substance and Sexual Activity  . Alcohol use: Yes     Alcohol/week: 0.0 standard drinks    Comment: rare; 1 to 2 beers a month  . Drug use: No  . Sexual activity: Not Currently  Other Topics Concern  . Not on file  Social History Narrative  . Not on file   Social Determinants of Health   Financial Resource Strain:   . Difficulty of Paying Living Expenses: Not on file  Food Insecurity:   . Worried About Charity fundraiser in the Last Year: Not on file  . Ran Out of Food in the Last Year: Not on file  Transportation Needs:   . Lack of Transportation (Medical): Not on file  . Lack of Transportation (Non-Medical): Not on file  Physical Activity:   . Days of Exercise per Week: Not on file  . Minutes of Exercise per Session: Not on file  Stress:   . Feeling of Stress : Not on file  Social Connections: Slightly Isolated  . Frequency of Communication with Friends and Family: More than three times a week  . Frequency of Social Gatherings with Friends and Family: Twice a week  . Attends Religious Services: More than 4 times per year  . Active Member of Clubs or Organizations: No  . Attends Club  or Organization Meetings: Never  . Marital Status: Living with partner  Intimate Partner Violence:   . Fear of Current or Ex-Partner: Not on file  . Emotionally Abused: Not on file  . Physically Abused: Not on file  . Sexually Abused: Not on file    Family History  Problem Relation Age of Onset  . Parkinson's disease Mother   . Arthritis Father   . Cancer Brother      Current Outpatient Medications:  .  amLODipine (NORVASC) 10 MG tablet, Take 1 tablet (10 mg total) by mouth daily. Dr [name], Disp: 90 tablet, Rfl: 0 .  aspirin 81 MG tablet, Take 81 mg by mouth daily. Reported on 07/28/2015, Disp: , Rfl:  .  clobetasol cream (TEMOVATE) 0.05 %, , Disp: , Rfl:  .  Cyanocobalamin (VITAMIN B-12) 5000 MCG SUBL, Place 1 tablet under the tongue daily., Disp: , Rfl:  .  finasteride (PROSCAR) 5 MG tablet, Take 1 tablet by mouth once daily, Disp:  30 tablet, Rfl: 1 .  Krill Oil 350 MG CAPS, Take 1 capsule by mouth daily., Disp: , Rfl:  .  Multiple Vitamin (MULTIVITAMIN WITH MINERALS) TABS tablet, Take 1 tablet by mouth daily., Disp: , Rfl:  .  mupirocin ointment (BACTROBAN) 2 %, APPLY 1 OINTMENT TOPICALLY TWICE DAILY AS NEEDED, Disp: , Rfl: 1 .  omeprazole (PRILOSEC) 20 MG capsule, Take 1 capsule by mouth once daily, Disp: 30 capsule, Rfl: 0 .  triamcinolone cream (KENALOG) 0.1 %, Apply 1 application topically 2 (two) times daily., Disp: 453.6 g, Rfl: 1  Physical exam:  There were no vitals filed for this visit. Physical Exam Constitutional:      Appearance: Normal appearance.  HENT:     Head: Normocephalic and atraumatic.  Eyes:     Pupils: Pupils are equal, round, and reactive to light.  Cardiovascular:     Rate and Rhythm: Normal rate and regular rhythm.     Heart sounds: Normal heart sounds. No murmur.  Pulmonary:     Effort: Pulmonary effort is normal.     Breath sounds: Normal breath sounds. No wheezing.  Abdominal:     General: Bowel sounds are normal. There is no distension.     Palpations: Abdomen is soft.     Tenderness: There is no abdominal tenderness.  Musculoskeletal:        General: Normal range of motion.     Cervical back: Normal range of motion.  Skin:    General: Skin is warm and dry.     Findings: No rash.  Neurological:     Mental Status: He is alert and oriented to person, place, and time.  Psychiatric:        Judgment: Judgment normal.      CMP Latest Ref Rng & Units 06/14/2018  Glucose 70 - 99 mg/dL 121(H)  BUN 8 - 23 mg/dL 16  Creatinine 0.61 - 1.24 mg/dL 0.93  Sodium 135 - 145 mmol/L 140  Potassium 3.5 - 5.1 mmol/L 3.8  Chloride 98 - 111 mmol/L 107  CO2 22 - 32 mmol/L 26  Calcium 8.9 - 10.3 mg/dL 9.0  Total Protein 6.5 - 8.1 g/dL 6.9  Total Bilirubin 0.3 - 1.2 mg/dL 1.1  Alkaline Phos 38 - 126 U/L 87  AST 15 - 41 U/L 27  ALT 0 - 44 U/L 27   CBC Latest Ref Rng & Units 06/14/2018   WBC 4.0 - 10.5 K/uL 15.0(H)  Hemoglobin 13.0 - 17.0 g/dL 13.9  Hematocrit 39.0 - 52.0 % 41.3  Platelets 150 - 400 K/uL 207    No images are attached to the encounter.  CT Chest Wo Contrast  Result Date: 07/23/2019 CLINICAL DATA:  Follow-up pulmonary nodules. EXAM: CT CHEST WITHOUT CONTRAST TECHNIQUE: Multidetector CT imaging of the chest was performed following the standard protocol without IV contrast. COMPARISON:  September 24, 2018 FINDINGS: Cardiovascular: The heart size is normal. Coronary calcifications involve the left main, lad, and left circumflex arteries. There is also atherosclerotic change at the origin of the right coronary artery. Central pulmonary arteries are normal in caliber. The thoracic aorta is nonaneurysmal with mild atherosclerotic change. Mediastinum/Nodes: There is a moderate hiatal hernia again identified. No effusions. The thyroid is unremarkable. No mediastinal adenopathy. No chest wall abnormalities are identified. Lungs/Pleura: Central airways are normal. No pneumothorax. No suspicious infiltrate or mass. The lingular nodule measures 6 mm on series 4, image 117 today versus 5 mm previously. A nodule in the posterior aspect of the left lower lobe measures 8 mm on series 4, image 107 today versus 8 mm on the previous study. The nodule in the right base on series 4, image 132 measures 6 mm by my measurement today versus 7 mm previously. A nodule adjacent to the left hemidiaphragm on series 4, image 157 measures 5 mm today versus 6 mm previously. A nodule in the right base on series 4, image 142 measures 9 mm today, unchanged in the interval. A nodule in the medial right upper lobe on series 4, image 196 measures 3 x 3 mm today versus 4 by 2 mm previously. There is some motion in this location which could result in the tiny difference in measurement. The nodule similar on today's coronal image versus the previous coronal image. Multiple other small nodules are stable in the  interval. Upper Abdomen: Large stones are seen in the gallbladder, unchanged. A large cyst is seen in the right kidney, unchanged. Musculoskeletal: No chest wall mass or suspicious bone lesions identified. IMPRESSION: 1. There are numerous pulmonary nodules. Two of the nodules seen on the previous study are stable. The other is very similar given difference in slice selection. Numerous other stable nodules are identified and described above. There is a nodule in the medial right upper lobe which measures slightly larger in 1 dimension, possibly due to motion in this location. Recommend a follow-up CT scan in 1 year. 2. Coronary artery calcifications. 3. Atherosclerotic changes in the abdominal aorta. 4. Hiatal hernia, moderate. 5. Large stones in the gallbladder. 6. Large right renal cyst. Aortic Atherosclerosis (ICD10-I70.0). Electronically Signed   By: Dorise Bullion III M.D   On: 07/23/2019 13:14   Plan- Patient is a 85 y.o. male who presents for initial visit to pulmonary lung nodule clinic and to receive results from CT scan.   Incidental lung nodule discovered by imaging in November 2019 during an evaluation at the [facility] Emergency Room for increased urinary frequency and fatigue resulting in imaging. Multiple nodules identified including a 9 mm left lower lobe nodule.  We reviewed both images together comparing scans.  CT scan from 09/24/2018 redemonstrated small nodules of the left and right lower lobes (5 mm nodule of the lingula, 8 mm nodule of the LLL, 7 mm nodule of the RLL). They appeared unchanged and/or slightly smaller in size from previous.  Follow-up CT scan completed on 07/23/2019 revealed numerous pulmonary nodules.  All appear stable.  It is recommended he follow-up in 1 year.  Calculating malignancy  probability of a pulmonary nodule: Risk factors include: 1.  Age. 2.  Cancer history.   3.  Diameter of the solitary pulmonary nodule in mm 4.  Location 5.  Smoking history 6.   Spiculation present  Based on pulmonary nodule malignancy risk factors, this patient is of low risk for the development of lung cancer. I would recommend a 12 month follow-up with imaging to ensure stability given there are multiple nodules and they are new.  Will schedule noncontrast CT of chest in approximately 12 months.  He will return to clinic few days later for results.  During our visit, we discussed pulmonary nodules are a common incidental finding and are often how lung cancer is discovered.  Lung cancer survival is directly related to the stage at diagnosis.  We discussed that nodules can vary in presentation from solitary pulmonary nodules to masses, to groundglass opacities and multiple nodules.  Pulmonary nodules in the majority of cases are benign but the probability of these becoming malignant cannot be undermined.  Early identification of malignant nodules could lead to early diagnosis and increased survival.    We discussed the probability of pulmonary nodules becoming malignant increase with age, pack years of tobacco use, size/characteristics of the nodule and location; with upper lobe involvement being most worrisome.   We discussed the goal of our clinic is to thoroughly evaluate each nodule, developed a comprehensive, individualized plan of care utilizing the most advanced technology and significantly reduce the time from detection to treatment.  A dedicated pulmonary nodule clinic has proven to indeed expedite the detection and treatment of lung cancer.   Patient education in fact sheet provided along with most recent CT scans.  Disposition: Repeat noncontrast CT in 12 months. RTC a few days later for results of scan, assessment and further recommendation for monitoring of lung nodules.   Visit Diagnosis 1. Lung nodule     Patient expressed understanding and was in agreement with this plan. He also understands that He can call clinic at any time with any questions, concerns,  or complaints.   Greater than 50% was spent in counseling and coordination of care with this patient including but not limited to discussion of the relevant topics above (See A&P) including, but not limited to diagnosis and management of acute and chronic medical conditions.   Thank you for allowing me to participate in the care of this very pleasant patient.    Jacquelin Hawking, NP [place] [facility] Cell - [phone] Pager- [phone] 07/25/2019 2:24 PM    CC: Dr. Otilio Miu

## 2019-07-25 ENCOUNTER — Other Ambulatory Visit: Payer: Self-pay | Admitting: Oncology

## 2019-07-25 DIAGNOSIS — R911 Solitary pulmonary nodule: Secondary | ICD-10-CM

## 2019-07-31 ENCOUNTER — Telehealth: Payer: Self-pay | Admitting: Unknown Physician Specialty

## 2019-07-31 ENCOUNTER — Other Ambulatory Visit: Payer: Self-pay

## 2019-07-31 ENCOUNTER — Ambulatory Visit
Admission: EM | Admit: 2019-07-31 | Discharge: 2019-07-31 | Disposition: A | Discharge status: Home / Self Care | Payer: Medicare HMO | Attending: Family Medicine | Admitting: Family Medicine

## 2019-07-31 ENCOUNTER — Other Ambulatory Visit: Payer: Self-pay | Admitting: Unknown Physician Specialty

## 2019-07-31 DIAGNOSIS — U071 COVID-19: Secondary | ICD-10-CM

## 2019-07-31 LAB — SARS CORONAVIRUS 2 AG (30 MIN TAT): SARS Coronavirus 2 Ag: POSITIVE — AB

## 2019-07-31 NOTE — ED Provider Notes (Signed)
[facility]    CSN: [id number] Arrival date & time: 07/31/19  1118  History   Chief Complaint Chief Complaint  Patient presents with  . +covid exposure  . Cough   HPI  85 year old male presents with 1 week history of cough, fatigue, joint pain.  Patient reports ongoing cough, fatigue, joint pain.  Denies fever.  His significant other was seen yesterday by me.  Tested positive for COVID-19.  She advised him to come in to get testing.  No medications or interventions tried.  Patient states that he is primarily fatigue.  He states that he feels okay today.  No known exacerbating or relieving factors.  No other complaints.  PMH, Surgical Hx, Family Hx, Social History reviewed and updated as below.  Past Medical History:  Diagnosis Date  . Allergy    seaasonal  . Anemia   . Colon polyp   . GERD (gastroesophageal reflux disease)   . Hypertension   . Lichen simplex chronicus   . Wears hearing aid in both ears     Patient Active Problem List   Diagnosis Date Noted  . Hematochezia   . Benign neoplasm of ascending colon   . Essential hypertension 01/09/2018  . GERD 11/04/2009  . ERECTILE DYSFUNCTION 07/28/2008  . DERMATITIS 07/28/2008  . LABYRINTHITIS 11/05/2007  . HYPERLIPIDEMIA 09/24/2007  . ANEMIA 09/24/2007  . CONDUCTIVE HEARING LOSS UNILATERAL 09/24/2007  . ARTHRITIS 09/24/2007  . UNS ADVRS EFF OTH RX MEDICINAL&BIOLOGICAL SBSTNC 09/24/2007    Past Surgical History:  Procedure Laterality Date  . Colles' fracture right 1956    . COLONOSCOPY  2013  . COLONOSCOPY WITH PROPOFOL N/A 07/11/2018   Procedure: COLONOSCOPY WITH PROPOFOL;  Surgeon: Lucilla Lame, MD;  Location: [place];  Service: Endoscopy;  Laterality: N/A;  . FRACTURE SURGERY    . fractured left arm radius and  ulna 1955    . fractured right tibiofibular 2009    . HAND SURGERY  06/02/11  . HERNIA REPAIR Right 2010, 2015  . HERNIA REPAIR Left 2015  . herniorpahy, rt  05/09/2009  .  NERVE REPAIR Right 08/12/2015   Procedure: EXPLORATION REPAIR  NERVE, DEBRIDEMENT FRACTURE RIGHT INDEX MIDDLE FINGER;  Surgeon: Daryll Brod, MD;  Location: [place];  Service: Orthopedics;  Laterality: Right;  . POLYPECTOMY  07/11/2018   Procedure: POLYPECTOMY;  Surgeon: Lucilla Lame, MD;  Location: [place];  Service: Endoscopy;;  . ruptured left Achilles tendon 1980    . surgery for cubital tunnel syndrome and left arm 1999    . tonsillectomy 1942         Home Medications    Prior to Admission medications   Medication Sig Start Date End Date Taking? Authorizing Provider  amLODipine (NORVASC) 10 MG tablet Take 1 tablet (10 mg total) by mouth daily. Dr [name] 02/18/19 11/24/20 Yes [name], MD  Multiple Vitamin (MULTIVITAMIN WITH MINERALS) TABS tablet Take 1 tablet by mouth daily.   Yes [provider]  omeprazole (PRILOSEC) 20 MG capsule Take 1 capsule by mouth once daily 05/08/19  Yes [name], MD  finasteride (PROSCAR) 5 MG tablet Take 1 tablet by mouth once daily 02/03/19   [name], MD    Family History Family History  Problem Relation Age of Onset  . Parkinson's disease Mother   . Arthritis Father   . Cancer Brother     Social History Social History   Tobacco Use  . Smoking status: Never  Smoker  . Smokeless tobacco: Never Used  . Tobacco comment: smoking cessation materials not required  Substance Use Topics  . Alcohol use: Yes    Alcohol/week: 0.0 standard drinks    Comment: rare; 1 to 2 beers a month  . Drug use: No     Allergies   Patient has no known allergies.   Review of Systems Review of Systems  Constitutional: Positive for fatigue. Negative for fever.  Respiratory: Positive for cough.   Musculoskeletal: Positive for arthralgias.   Physical Exam Triage Vital Signs ED Triage Vitals  Enc Vitals Group     BP 07/31/19 1135 129/77     Pulse Rate 07/31/19 1135 60     Resp 07/31/19 1135 18      Temp 07/31/19 1135 98.2 F (36.8 C)     Temp Source 07/31/19 1135 Oral     SpO2 07/31/19 1135 99 %     Weight 07/31/19 1135 180 lb (81.6 kg)     Height 07/31/19 1135 5\' 9"  (1.753 m)     Head Circumference --      Peak Flow --      Pain Score 07/31/19 1134 0     Pain Loc --      Pain Edu? --      Excl. in [place]? --    Updated Vital Signs BP 129/77 (BP Location: Left Arm)   Pulse 60   Temp 98.2 F (36.8 C) (Oral)   Resp 18   Ht 5\' 9"  (1.753 m)   Wt 81.6 kg   SpO2 99%   BMI 26.58 kg/m   Visual Acuity Right Eye Distance:   Left Eye Distance:   Bilateral Distance:    Right Eye Near:   Left Eye Near:    Bilateral Near:     Physical Exam Vitals and nursing note reviewed.  Constitutional:      General: He is not in acute distress.    Comments: Appears fatigued.  HENT:     Head: Normocephalic and atraumatic.  Eyes:     General:        Right eye: No discharge.        Left eye: No discharge.     Conjunctiva/sclera: Conjunctivae normal.  Cardiovascular:     Rate and Rhythm: Normal rate and regular rhythm.     Heart sounds: No murmur.  Pulmonary:     Effort: Pulmonary effort is normal.     Breath sounds: Normal breath sounds. No wheezing, rhonchi or rales.  Skin:    General: Skin is warm.     Findings: No rash.  Neurological:     Mental Status: He is alert.  Psychiatric:        Mood and Affect: Mood normal.        Behavior: Behavior normal.    UC Treatments / Results  Labs (all labs ordered are listed, but only abnormal results are displayed) Labs Reviewed  SARS CORONAVIRUS 2 AG (30 MIN TAT) - Abnormal; Notable for the following components:      Result Value   SARS Coronavirus 2 Ag POSITIVE (*)    All other components within normal limits    EKG   Radiology No results found.  Procedures Procedures (including critical care time)  Medications Ordered in UC Medications - No data to display  Initial Impression / Assessment and Plan / UC Course  I have  reviewed the triage vital signs and the nursing notes.  Pertinent labs & imaging  results that were available during my care of the patient were reviewed by me and considered in my medical decision making (see chart for details).    85 year old male presents with COVID-19.  Rapid test positive today.  Advised to stay home.  Supportive care.  Advised to go to the hospital if he worsens.  Referral made to infusion clinic.  Final Clinical Impressions(s) / UC Diagnoses   Final diagnoses:  [id number]     Discharge Instructions     Stay home.  I have referred you to an infusion clinic for a new drug (your significant other referred as well).  If you worsen, go to the hospital.  Take care  Dr. Lacinda Axon     ED Prescriptions    None     PDMP not reviewed this encounter.   Coral Spikes, [place] 07/31/19 1234

## 2019-07-31 NOTE — ED Triage Notes (Signed)
Patient in today stating that his wife tested positive for covid yesterday. Patient states he has had a 1 week history of cough, fatigue and joint pain.

## 2019-07-31 NOTE — Discharge Instructions (Signed)
Stay home.  I have referred you to an infusion clinic for a new drug (your significant other referred as well).  If you worsen, go to the hospital.  Take care  Dr. Lacinda Axon

## 2019-07-31 NOTE — Telephone Encounter (Signed)
  I connected by phone with Marco Sims on 07/31/2019 at 12:49 PM to discuss the potential use of an new treatment for mild to moderate COVID-19 viral infection in non-hospitalized patients.  This patient is a 85 y.o. male that meets the FDA criteria for Emergency Use Authorization of bamlanivimab or casirivimab\imdevimab.  Has a (+) direct SARS-CoV-2 viral test result  Has mild or moderate COVID-19   Is ? 85 years of age and weighs ? 40 kg  Is NOT hospitalized due to COVID-19  Is NOT requiring oxygen therapy or requiring an increase in baseline oxygen flow rate due to COVID-19  Is within 10 days of symptom onset  Has at least one of the high risk factor(s) for progression to severe COVID-19 and/or hospitalization as defined in EUA.  Specific high risk criteria : >/= 83 yo   I have spoken and communicated the following to the patient or parent/caregiver:  1. FDA has authorized the emergency use of bamlanivimab and casirivimab\imdevimab for the treatment of mild to moderate COVID-19 in adults and pediatric patients with positive results of direct SARS-CoV-2 viral testing who are 41 years of age and older weighing at least 40 kg, and who are at high risk for progressing to severe COVID-19 and/or hospitalization.  2. The significant known and potential risks and benefits of bamlanivimab and casirivimab\imdevimab, and the extent to which such potential risks and benefits are unknown.  3. Information on available alternative treatments and the risks and benefits of those alternatives, including clinical trials.  4. Patients treated with bamlanivimab and casirivimab\imdevimab should continue to self-isolate and use infection control measures (e.g., wear mask, isolate, social distance, avoid sharing personal items, clean and disinfect "high touch" surfaces, and frequent handwashing) according to CDC guidelines.   5. The patient or parent/caregiver has the option to accept or refuse  bamlanivimab or casirivimab\imdevimab .  After reviewing this information with the patient, The patient agreed to proceed with receiving the bamlanimivab infusion and will be provided a copy of the Fact sheet prior to receiving the infusion.[name] 07/31/2019 12:49 PM  Symptom onset 12/20

## 2019-08-04 ENCOUNTER — Telehealth: Payer: Self-pay

## 2019-08-04 ENCOUNTER — Ambulatory Visit (HOSPITAL_COMMUNITY)
Admission: RE | Admit: 2019-08-04 | Discharge: 2019-08-04 | Disposition: A | Discharge status: Home / Self Care | Payer: Medicare Other | Source: Ambulatory Visit | Attending: Pulmonary Disease | Admitting: Pulmonary Disease

## 2019-08-04 DIAGNOSIS — Z23 Encounter for immunization: Secondary | ICD-10-CM | POA: Insufficient documentation

## 2019-08-04 DIAGNOSIS — U071 COVID-19: Secondary | ICD-10-CM

## 2019-08-04 MED ORDER — FAMOTIDINE IN NACL 20-0.9 MG/50ML-% IV SOLN: 20.0000 mg | Freq: Once | INTRAVENOUS | Status: DC | PRN

## 2019-08-04 MED ORDER — EPINEPHRINE 0.3 MG/0.3ML IJ SOAJ: 0.3000 mg | Freq: Once | INTRAMUSCULAR | Status: DC | PRN

## 2019-08-04 MED ORDER — SODIUM CHLORIDE 0.9 % IV SOLN: INTRAVENOUS | Status: DC | PRN

## 2019-08-04 MED ORDER — SODIUM CHLORIDE 0.9 % IV SOLN
700.0000 mg | Freq: Once | INTRAVENOUS | Status: AC
  Administered 2019-08-04: 12:00:00 700 mg via INTRAVENOUS
  Filled 2019-08-04: qty 20

## 2019-08-04 MED ORDER — ALBUTEROL SULFATE HFA 108 (90 BASE) MCG/ACT IN AERS: 2.0000 | INHALATION_SPRAY | Freq: Once | RESPIRATORY_TRACT | Status: DC | PRN

## 2019-08-04 MED ORDER — METHYLPREDNISOLONE SODIUM SUCC 125 MG IJ SOLR: 125.0000 mg | Freq: Once | INTRAMUSCULAR | Status: DC | PRN

## 2019-08-04 MED ORDER — DIPHENHYDRAMINE HCL 50 MG/ML IJ SOLN: 50.0000 mg | Freq: Once | INTRAMUSCULAR | Status: DC | PRN

## 2019-08-04 NOTE — Telephone Encounter (Signed)
Pt had called in stating that he tested positive for covid 19/ he and his spouse, on last 71 and Thurs- 3 and 41. He has an appt scheduled today for a "covid infusion"- but is having "black out spells" and wants to know if this will interfere with him getting the infusion. I spoke to his spouse and told her that he needs to pass this information on to them before getting seen

## 2019-08-04 NOTE — Progress Notes (Addendum)
.   Diagnosis: COVID-19  Physician: Dr.Jones Deanna  Procedure: [facility] Infusion [facility] Med: bamlanivimab infusion - Provided patient with bamlanimivab fact sheet for patients, parents and caregivers prior to infusion.  Complications: No immediate complications noted.  Discharge: Discharged home   Babs Sciara 08/04/2019

## 2019-08-04 NOTE — Discharge Instructions (Signed)
COVID-19: How to Protect Yourself and Others Know how it spreads  There is currently no vaccine to prevent coronavirus disease 2019 (COVID-19).  The best way to prevent illness is to avoid being exposed to this virus.  The virus is thought to spread mainly from person-to-person. ? Between people who are in close contact with one another (within about 6 feet). ? Through respiratory droplets produced when an infected person coughs, sneezes or talks. ? These droplets can land in the mouths or noses of people who are nearby or possibly be inhaled into the lungs. ? Some recent studies have suggested that COVID-19 may be spread by people who are not showing symptoms. Everyone should Clean your hands often  Wash your hands often with soap and water for at least 20 seconds especially after you have been in a public place, or after blowing your nose, coughing, or sneezing.  If soap and water are not readily available, use a hand sanitizer that contains at least 60% alcohol. Cover all surfaces of your hands and rub them together until they feel dry.  Avoid touching your eyes, nose, and mouth with unwashed hands. Avoid close contact  Stay home if you are sick.  Avoid close contact with people who are sick.  Put distance between yourself and other people. ? Remember that some people without symptoms may be able to spread virus. ? This is especially important for people who are at higher risk of getting very [email or web address] Cover your mouth and nose with a cloth face cover when around others  You could spread COVID-19 to others even if you do not feel sick.  Everyone should wear a cloth face cover when they have to go out in public, for example to the grocery store or to pick up other necessities. ? Cloth face coverings should not be placed on young children under age 23, anyone who has trouble breathing, or is unconscious,  incapacitated or otherwise unable to remove the mask without assistance.  The cloth face cover is meant to protect other people in case you are infected.  Do NOT use a facemask meant for a Dietitian.  Continue to keep about 6 feet between yourself and others. The cloth face cover is not a substitute for social distancing. Cover coughs and sneezes  If you are in a private setting and do not have on your cloth face covering, remember to always cover your mouth and nose with a tissue when you cough or sneeze or use the inside of your elbow.  Throw used tissues in the trash.  Immediately wash your hands with soap and water for at least 20 seconds. If soap and water are not readily available, clean your hands with a hand sanitizer that contains at least 60% alcohol. Clean and disinfect  Clean AND disinfect frequently touched surfaces daily. This includes tables, doorknobs, light switches, countertops, handles, desks, phones, keyboards, toilets, faucets, and sinks. [email or web address]  If surfaces are dirty, clean them: Use detergent or soap and water prior to disinfection.  Then, use a household disinfectant. You can see a list of EPA-registered household disinfectants here. [email or web address].[email or web address] 12/10/2018 This information is not intended to replace advice given to you by your health care provider. Make sure you discuss any questions you have with your health care provider. Document Released: 11/19/2018 Document Revised: 12/18/2018 Document Reviewed: 11/19/2018 Elsevier Patient Education  [place] if You Are Sick If you are sick with COVID-19 or think  you might have COVID-19, follow the steps below to help protect other people in your home and community. Stay home except to get medical care.  Stay home. Most people with COVID-19 have mild illness and are able to recover at home  without medical care. Do not leave your home, except to get medical care. Do not visit public areas.  Take care of yourself. Get rest and stay hydrated.  Get medical care when needed. Call your doctor before you go to their office for care. But, if you have trouble breathing or other concerning symptoms, call 911 for immediate help.  Avoid public transportation, ride-sharing, or taxis. Separate yourself from other people and pets in your home.  As much as possible, stay in a specific room and away from other people and pets in your home. Also, you should use a separate bathroom, if available. If you need to be around other people or animals in or outside of the home, wear a cloth face covering. ? See COVID-19 and Animals if you have questions about pets: https://www.thomas.biz/ Monitor your symptoms.  Common symptoms of COVID-19 include fever and cough. Trouble breathing is a more serious symptom that means you should get medical attention.  Follow care instructions from your healthcare provider and local health department. Your local health authorities will give instructions on checking your symptoms and reporting information. If you develop emergency warning signs for COVID-19 get medical attention immediately.  Emergency warning signs include*:  Trouble breathing  Persistent pain or pressure in the chest  New confusion or not able to be woken  Bluish lips or face *This list is not all inclusive. Please consult your medical provider for any other symptoms that are severe or concerning to you. Call 911 if you have a medical emergency. If you have a medical emergency and need to call 911, notify the operator that you have or think you might have, COVID-19. If possible, put on a facemask before medical help arrives. Call ahead before visiting your doctor.  Call ahead. Many medical visits for routine care are being postponed or done by phone or  telemedicine.  If you have a medical appointment that cannot be postponed, call your doctor's office. This will help the office protect themselves and other patients. If you are sick, wear a cloth covering over your nose and mouth.  You should wear a cloth face covering over your nose and mouth if you must be around other people or animals, including pets (even at home).  You don't need to wear the cloth face covering if you are alone. If you can't put on a cloth face covering (because of trouble breathing for example), cover your coughs and sneezes in some other way. Try to stay at least 6 feet away from other people. This will help protect the people around you. Note: During the COVID-19 pandemic, medical grade facemasks are reserved for healthcare workers and some first responders. You may need to make a cloth face covering using a scarf or bandana. Cover your coughs and sneezes.  Cover your mouth and nose with a tissue when you cough or sneeze.  Throw used tissues in a lined trash can.  Immediately wash your hands with soap and water for at least 20 seconds. If soap and water are not available, clean your hands with an alcohol-based hand sanitizer that contains at least 60% alcohol. Clean your hands often.  Wash your hands often with soap and water for at least 20 seconds. This is  especially important after blowing your nose, coughing, or sneezing; going to the bathroom; and before eating or preparing food.  Use hand sanitizer if soap and water are not available. Use an alcohol-based hand sanitizer with at least 60% alcohol, covering all surfaces of your hands and rubbing them together until they feel dry.  Soap and water are the best option, especially if your hands are visibly dirty.  Avoid touching your eyes, nose, and mouth with unwashed hands. Avoid sharing personal household items.  Do not share dishes, drinking glasses, cups, eating utensils, towels, or bedding with other people  in your home.  Wash these items thoroughly after using them with soap and water or put them in the dishwasher. Clean all "high-touch" surfaces everyday.  Clean and disinfect high-touch surfaces in your "sick room" and bathroom. Let someone else clean and disinfect surfaces in common areas, but not your bedroom and bathroom.  If a caregiver or other person needs to clean and disinfect a sick person's bedroom or bathroom, they should do so on an as-needed basis. The caregiver/other person should wear a mask and wait as long as possible after the sick person has used the bathroom. High-touch surfaces include phones, remote controls, counters, tabletops, doorknobs, bathroom fixtures, toilets, keyboards, tablets, and bedside tables.  Clean and disinfect areas that may have blood, stool, or body fluids on them.  Use household cleaners and disinfectants. Clean the area or item with soap and water or another detergent if it is dirty. Then use a household disinfectant. ? Be sure to follow the instructions on the label to ensure safe and effective use of the product. Many products recommend keeping the surface wet for several minutes to ensure germs are killed. Many also recommend precautions such as wearing gloves and making sure you have good ventilation during use of the product. ? Most EPA-registered household disinfectants should be effective. How to discontinue home isolation  People with COVID-19 who have stayed home (home isolated) can stop home isolation under the following conditions: ? If you will not have a test to determine if you are still contagious, you can leave home after these three things have happened:  You have had no fever for at least 72 hours (that is three full days of no fever without the use of medicine that reduces fevers) AND  other symptoms have improved (for example, when your cough or shortness of breath has improved) AND  at least 10 days have passed since your  symptoms first appeared. ? If you will be tested to determine if you are still contagious, you can leave home after these three things have happened:  You no longer have a fever (without the use of medicine that reduces fevers) AND  other symptoms have improved (for example, when your cough or shortness of breath has improved) AND  you received two negative tests in a row, 24 hours apart. Your doctor will follow CDC guidelines. In all cases, follow the guidance of your healthcare provider and local health department. The decision to stop home isolation should be made in consultation with your healthcare provider and state and local health departments. Local decisions depend on local circumstances. [email or web address] 12/08/2018 This information is not intended to replace advice given to you by your health care provider. Make sure you discuss any questions you have with your health care provider. Document Released: 11/19/2018 Document Revised: 12/18/2018 Document Reviewed: 11/19/2018 Elsevier Patient Education  [place].

## 2019-08-06 ENCOUNTER — Other Ambulatory Visit: Payer: Self-pay | Admitting: Family Medicine

## 2019-08-06 DIAGNOSIS — K219 Gastro-esophageal reflux disease without esophagitis: Secondary | ICD-10-CM

## 2019-08-30 ENCOUNTER — Encounter: Payer: Self-pay | Admitting: Emergency Medicine

## 2019-08-30 ENCOUNTER — Emergency Department: Payer: Medicare HMO

## 2019-08-30 ENCOUNTER — Other Ambulatory Visit: Payer: Self-pay

## 2019-08-30 ENCOUNTER — Emergency Department
Admission: EM | Admit: 2019-08-30 | Discharge: 2019-08-30 | Disposition: A | Discharge status: Home / Self Care | Payer: Medicare HMO | Attending: Emergency Medicine | Admitting: Emergency Medicine

## 2019-08-30 DIAGNOSIS — Y92009 Unspecified place in unspecified non-institutional (private) residence as the place of occurrence of the external cause: Secondary | ICD-10-CM | POA: Diagnosis not present

## 2019-08-30 DIAGNOSIS — Z79899 Other long term (current) drug therapy: Secondary | ICD-10-CM | POA: Insufficient documentation

## 2019-08-30 DIAGNOSIS — S61317A Laceration without foreign body of left little finger with damage to nail, initial encounter: Secondary | ICD-10-CM | POA: Diagnosis not present

## 2019-08-30 DIAGNOSIS — I10 Essential (primary) hypertension: Secondary | ICD-10-CM | POA: Insufficient documentation

## 2019-08-30 DIAGNOSIS — S61315A Laceration without foreign body of left ring finger with damage to nail, initial encounter: Secondary | ICD-10-CM

## 2019-08-30 DIAGNOSIS — W298XXA Contact with other powered powered hand tools and household machinery, initial encounter: Secondary | ICD-10-CM | POA: Insufficient documentation

## 2019-08-30 DIAGNOSIS — S62637A Displaced fracture of distal phalanx of left little finger, initial encounter for closed fracture: Secondary | ICD-10-CM | POA: Diagnosis not present

## 2019-08-30 DIAGNOSIS — S6992XA Unspecified injury of left wrist, hand and finger(s), initial encounter: Secondary | ICD-10-CM

## 2019-08-30 DIAGNOSIS — S61215A Laceration without foreign body of left ring finger without damage to nail, initial encounter: Secondary | ICD-10-CM | POA: Diagnosis not present

## 2019-08-30 DIAGNOSIS — Y999 Unspecified external cause status: Secondary | ICD-10-CM | POA: Insufficient documentation

## 2019-08-30 DIAGNOSIS — S61217A Laceration without foreign body of left little finger without damage to nail, initial encounter: Secondary | ICD-10-CM | POA: Diagnosis not present

## 2019-08-30 DIAGNOSIS — Y9389 Activity, other specified: Secondary | ICD-10-CM | POA: Diagnosis not present

## 2019-08-30 MED ORDER — LIDOCAINE-EPINEPHRINE 2 %-1:100000 IJ SOLN
20.0000 mL | Freq: Once | INTRAMUSCULAR | Status: AC
  Administered 2019-08-30: 20 mL
  Filled 2019-08-30: qty 1

## 2019-08-30 MED ORDER — CEPHALEXIN 500 MG PO CAPS: 500.0000 mg | ORAL_CAPSULE | Freq: Once | ORAL | Status: DC

## 2019-08-30 MED ORDER — CEPHALEXIN 500 MG PO CAPS: 500.0000 mg | ORAL_CAPSULE | Freq: Three times a day (TID) | ORAL | 0 refills | Status: DC

## 2019-08-30 MED ORDER — OXYCODONE-ACETAMINOPHEN 5-325 MG PO TABS: 1.0000 | ORAL_TABLET | Freq: Four times a day (QID) | ORAL | 0 refills | Status: DC | PRN

## 2019-08-30 MED ORDER — MORPHINE SULFATE (PF) 4 MG/ML IV SOLN
4.0000 mg | Freq: Once | INTRAVENOUS | Status: AC
  Administered 2019-08-30: 17:00:00 4 mg via INTRAMUSCULAR
  Filled 2019-08-30: qty 1

## 2019-08-30 NOTE — ED Notes (Signed)
The tip of pt's left pinky is cut off, and the second finger has laceration. Pt states cut himself with a joint cutter. Bleeding is present but does not appear to be arterial. Wounds cleaned with normal saline and wrapped in gauze.

## 2019-08-30 NOTE — Discharge Instructions (Signed)
Return to the ER if you have severe pain or bleeding from the fingers.  Keep the hand iced and elevated tonight to decrease pain, swelling, and bleeding.  Follow up with orthopedics this week for further evaluation.

## 2019-08-30 NOTE — ED Notes (Signed)
MD at bedside examining pt.

## 2019-08-30 NOTE — ED Notes (Signed)
Spoke with pt's son and update provided.

## 2019-08-30 NOTE — ED Notes (Signed)
Lidocaine at bedside.

## 2019-08-30 NOTE — ED Provider Notes (Signed)
[facility] Emergency Department Provider Note  ____________________________________________  Time seen: Approximately 6:41 PM  I have reviewed the triage vital signs and the nursing notes.   HISTORY  Chief Complaint Hand Injury    HPI Marco Sims is a 86 y.o. male with a history of GERD who was in his usual state of health when he was using a woodworking tool to grind a wood edge smooth and inadvertently got his hand caught in ground away part of his fingers.  His left lateral ring finger and left lateral small finger are both affected.  This occurred about an hour prior to arrival in the emergency department.  Denies any other injuries.  Last tetanus shot was about 4 years ago.  His pain in the fingers which is constant without aggravating or alleviating factors, nonradiating.      Past Medical History:  Diagnosis Date  . Allergy    seaasonal  . Anemia   . Colon polyp   . GERD (gastroesophageal reflux disease)   . Hypertension   . Lichen simplex chronicus   . Wears hearing aid in both ears      Patient Active Problem List   Diagnosis Date Noted  . Hematochezia   . Benign neoplasm of ascending colon   . Essential hypertension 01/09/2018  . GERD 11/04/2009  . ERECTILE DYSFUNCTION 07/28/2008  . DERMATITIS 07/28/2008  . LABYRINTHITIS 11/05/2007  . HYPERLIPIDEMIA 09/24/2007  . ANEMIA 09/24/2007  . CONDUCTIVE HEARING LOSS UNILATERAL 09/24/2007  . ARTHRITIS 09/24/2007  . UNS ADVRS EFF OTH RX MEDICINAL&BIOLOGICAL SBSTNC 09/24/2007     Past Surgical History:  Procedure Laterality Date  . Colles' fracture right 1956    . COLONOSCOPY  2013  . COLONOSCOPY WITH PROPOFOL N/A 07/11/2018   Procedure: COLONOSCOPY WITH PROPOFOL;  Surgeon: Lucilla Lame, MD;  Location: [place];  Service: Endoscopy;  Laterality: N/A;  . FRACTURE SURGERY    . fractured left arm radius and  ulna 1955    . fractured right tibiofibular 2009    . HAND SURGERY   06/02/11  . HERNIA REPAIR Right 2010, 2015  . HERNIA REPAIR Left 2015  . herniorpahy, rt  05/09/2009  . NERVE REPAIR Right 08/12/2015   Procedure: EXPLORATION REPAIR  NERVE, DEBRIDEMENT FRACTURE RIGHT INDEX MIDDLE FINGER;  Surgeon: Daryll Brod, MD;  Location: [place];  Service: Orthopedics;  Laterality: Right;  . POLYPECTOMY  07/11/2018   Procedure: POLYPECTOMY;  Surgeon: Lucilla Lame, MD;  Location: [place];  Service: Endoscopy;;  . ruptured left Achilles tendon 1980    . surgery for cubital tunnel syndrome and left arm 1999    . tonsillectomy 1942       Prior to Admission medications   Medication Sig Start Date End Date Taking? Authorizing Provider  amLODipine (NORVASC) 10 MG tablet Take 1 tablet (10 mg total) by mouth daily. Dr [name] 02/18/19 11/24/20  [name], MD  cephALEXin (KEFLEX) 500 MG capsule Take 1 capsule (500 mg total) by mouth 3 (three) times daily. 08/30/19   Carrie Mew, MD  finasteride (PROSCAR) 5 MG tablet Take 1 tablet by mouth once daily 02/03/19   [name], MD  Multiple Vitamin (MULTIVITAMIN WITH MINERALS) TABS tablet Take 1 tablet by mouth daily.    [provider]  omeprazole (PRILOSEC) 20 MG capsule Take 1 capsule by mouth once daily 08/07/19   [name], MD  oxyCODONE-acetaminophen (PERCOCET) 5-325 MG tablet Take 1 tablet by  mouth every 6 (six) hours as needed for severe pain. 08/30/19 08/29/20  Carrie Mew, MD     Allergies Patient has no known allergies.   Family History  Problem Relation Age of Onset  . Parkinson's disease Mother   . Arthritis Father   . Cancer Brother     Social History Social History   Tobacco Use  . Smoking status: Never Smoker  . Smokeless tobacco: Never Used  . Tobacco comment: smoking cessation materials not required  Substance Use Topics  . Alcohol use: Yes    Alcohol/week: 0.0 standard drinks    Comment: rare; 1 to 2 beers a month  . Drug use: No     Review of Systems  Constitutional:   No fever or chills.  ENT:   No sore throat. No rhinorrhea. Cardiovascular:   No chest pain or syncope. Respiratory:   No dyspnea or cough. Gastrointestinal:   Negative for abdominal pain, vomiting and diarrhea.  Musculoskeletal:   Left fingers pain as above All other systems reviewed and are negative except as documented above in ROS and HPI.  ____________________________________________   PHYSICAL EXAM:  VITAL SIGNS: ED Triage Vitals  Enc Vitals Group     BP 08/30/19 1638 (!) 167/77     Pulse Rate 08/30/19 1636 77     Resp 08/30/19 1636 16     Temp 08/30/19 1636 97.8 F (36.6 C)     Temp Source 08/30/19 1636 Oral     SpO2 08/30/19 1636 96 %     Weight 08/30/19 1632 180 lb (81.6 kg)     Height 08/30/19 1632 5\' 9"  (1.753 m)     Head Circumference --      Peak Flow --      Pain Score 08/30/19 1632 6     Pain Loc --      Pain Edu? --      Excl. in [place]? --     Vital signs reviewed, nursing assessments reviewed.   Constitutional:   Alert and oriented. Non-toxic appearance. Eyes:   Conjunctivae are normal. EOMI. ENT      Head:   Normocephalic and atraumatic.   Cardiovascular:   RRR.  Normal left radial and ulnar pulse cap refill less than 2 seconds. Respiratory:   Normal respiratory effort without tachypnea  Musculoskeletal:   Normal range of motion in all extremities.  No edema.  Left lateral ring finger is significantly avulsed down to the tuft of the distal phalanx, continuous oozing bleeding.  Left lateral small finger is severely avulsed, removing greater than half of the fingernail and extending down into the tuft of the distal phalanx.  Continuous bleeding from this fingertip as well. Neurologic:   Normal speech and language.  Motor grossly intact. No acute focal neurologic deficits are appreciated.  Skin:    Skin is warm, dry with open wounds of left fourth and fifth finger as  above ____________________________________________    LABS (pertinent positives/negatives) (all labs ordered are listed, but only abnormal results are displayed) Labs Reviewed - No data to display ____________________________________________   EKG  ____________________________________________    RADIOLOGY  DG Hand Complete Left  Result Date: 08/30/2019 CLINICAL DATA:  Partial finger amputation. EXAM: LEFT HAND - COMPLETE 3+ VIEW COMPARISON:  06/01/2011 FINDINGS: There is a defect involving the tuft of the distal phalanx of the fifth digit. Portions of the bone are missing. There are lacerations involving the distal aspects of the fourth and fifth digits. There are advanced  degenerative changes at the first carpometacarpal joint and the second metacarpophalangeal joint. IMPRESSION: 1. Acute fracture of the tuft of the distal phalanx of the fifth digit. A portion of the bone is missing. 2. Lacerations of the fourth and fifth digits. 3. Degenerative changes as above. Electronically Signed   By: Constance Holster M.D.   On: 08/30/2019 17:19    ____________________________________________   PROCEDURES .Marland KitchenLaceration Repair  Date/Time: 08/30/2019 6:41 PM Performed by: Carrie Mew, MD Authorized by: Carrie Mew, MD   Consent:    Consent obtained:  Verbal   Consent given by:  Patient   Risks discussed:  Infection, pain, retained foreign body, poor cosmetic result, poor wound healing, need for additional repair and nerve damage   Alternatives discussed:  Referral and delayed treatment Anesthesia (see MAR for exact dosages):    Anesthesia method:  Nerve block   Block location:  L small finger digital block   Block needle gauge:  24 G   Block anesthetic:  Lidocaine 2% WITH epi   Block technique:  Digital block   Block injection procedure:  Anatomic landmarks identified, anatomic landmarks palpated, introduced needle, negative aspiration for blood and incremental injection    Block outcome:  Anesthesia achieved Laceration details:    Location:  Finger   Finger location:  L small finger   Length (cm):  2 Repair type:    Repair type:  Complex Pre-procedure details:    Preparation:  Patient was prepped and draped in usual sterile fashion and imaging obtained to evaluate for foreign bodies Exploration:    Limited defect created (wound extended): no     Hemostasis achieved with:  Direct pressure, tourniquet and tied off vessels   Wound exploration: wound explored through full range of motion and entire depth of wound probed and visualized     Wound extent: muscle damage and vascular damage     Contaminated: no   Treatment:    Area cleansed with:  Saline and Betadine   Amount of cleaning:  Extensive   Irrigation solution:  Sterile saline   Irrigation method:  Pressure wash   Visualized foreign bodies/material removed: no     Debridement:  Moderate   Undermining:  Minimal Subcutaneous repair:    Suture size:  4-0   Wound subcutaneous closure material used: monocryl.   Suture technique:  Simple interrupted   Number of sutures:  2 Skin repair:    Repair method:  Sutures   Suture size:  3-0   Wound skin closure material used: ethilon.   Suture technique:  Running   Number of sutures:  5 Approximation:    Approximation:  Loose Post-procedure details:    Dressing:  Sterile dressing and non-adherent dressing   Patient tolerance of procedure:  Tolerated well, no immediate complications Comments:     Nail remnant partially removed to expose additional soft tissue for better wound closure.  Due to depth of tissue avulsion, wound is only able to be partially closed but hemostasis was achieved.   Marland Kitchen.Laceration Repair  Date/Time: 08/30/2019 6:44 PM Performed by: Carrie Mew, MD Authorized by: Carrie Mew, MD   Consent:    Consent obtained:  Verbal   Consent given by:  Patient   Risks discussed:  Infection, pain, retained foreign body, poor  cosmetic result and poor wound healing Anesthesia (see MAR for exact dosages):    Anesthesia method:  Nerve block   Block location:  Left ring finger digital block   Block needle gauge:  24  G   Block anesthetic:  Lidocaine 2% WITH epi   Block technique:  Medial/lateral   Block injection procedure:  Anatomic landmarks identified, introduced needle, negative aspiration for blood, incremental injection and anatomic landmarks palpated   Block outcome:  Anesthesia achieved Laceration details:    Location:  Finger   Finger location:  L ring finger   Length (cm):  2 Repair type:    Repair type:  Complex Pre-procedure details:    Preparation:  Patient was prepped and draped in usual sterile fashion and imaging obtained to evaluate for foreign bodies Exploration:    Limited defect created (wound extended): no     Hemostasis achieved with:  Direct pressure   Wound exploration: entire depth of wound probed and visualized     Wound extent: muscle damage, underlying fracture and vascular damage     Contaminated: no   Treatment:    Area cleansed with:  Saline and Betadine   Amount of cleaning:  Extensive   Irrigation solution:  Sterile saline   Irrigation method:  Pressure wash   Visualized foreign bodies/material removed: no     Debridement:  Minimal   Undermining:  Minimal   Scar revision: no   Subcutaneous repair:    Suture size:  4-0   Wound subcutaneous closure material used: monocryl.   Suture technique:  Simple interrupted   Number of sutures:  3 Skin repair:    Repair method:  Sutures   Suture size:  4-0   Wound skin closure material used: monocryl.   Suture technique:  Simple interrupted   Number of sutures:  4 Approximation:    Approximation:  Loose Post-procedure details:    Dressing:  Sterile dressing and non-adherent dressing   Patient tolerance of procedure:  Tolerated well, no immediate complications Comments:     Wound unable to be completely closed due to degree of  tissue loss    ____________________________________________  CLINICAL IMPRESSION / ASSESSMENT AND PLAN / ED COURSE  Pertinent labs & imaging results that were available during my care of the patient were reviewed by me and considered in my medical decision making (see chart for details).  JAQUEL GREENLEAF was evaluated in Emergency Department on 08/30/2019 for the symptoms described in the history of present illness. He was evaluated in the context of the global COVID-19 pandemic, which necessitated consideration that the patient might be at risk for infection with the SARS-CoV-2 virus that causes COVID-19. Institutional protocols and algorithms that pertain to the evaluation of patients at risk for COVID-19 are in a state of rapid change based on information released by regulatory bodies including the CDC and federal and state organizations. These policies and algorithms were followed during the patient's care in the ED.   Patient presents with wounds of the distal left fourth and fifth fingers from a woodworking grinding tool.  Tetanus is up-to-date.  No other injuries.  Unfortunately there is substantial tissue loss limiting potential for repair.  Anesthesia achieved with digital blocks in addition to IM morphine.  Hemostasis achieved with temporary finger tourniquets and then tying off of bleeding vessels.  Wounds were cleansed and closed as much as possible.  I will have the patient take Keflex for now to guard against infection and follow-up with orthopedics for further assessment for functional outcome.      ____________________________________________   FINAL CLINICAL IMPRESSION(S) / ED DIAGNOSES    Final diagnoses:  Injury of finger of left hand, initial encounter  Laceration of left  ring finger without foreign body with damage to nail, initial encounter  Laceration of left little finger without foreign body with damage to nail, initial encounter     ED Discharge Orders          Ordered    cephALEXin (KEFLEX) 500 MG capsule  3 times daily     08/30/19 1840    oxyCODONE-acetaminophen (PERCOCET) 5-325 MG tablet  Every 6 hours PRN     08/30/19 1850          Portions of this note were generated with dragon dictation software. Dictation errors may occur despite best attempts at proofreading.   Carrie Mew, MD 08/30/19 [phone]

## 2019-08-30 NOTE — ED Triage Notes (Signed)
FIRST NURSE NOTE: PT reports cutting right pinky finger on jointer, partial amputation of anterior finger missing.

## 2019-09-02 DIAGNOSIS — S61215A Laceration without foreign body of left ring finger without damage to nail, initial encounter: Secondary | ICD-10-CM | POA: Diagnosis not present

## 2019-09-02 DIAGNOSIS — S68627A Partial traumatic transphalangeal amputation of left little finger, initial encounter: Secondary | ICD-10-CM | POA: Diagnosis not present

## 2019-09-04 DIAGNOSIS — S68627D Partial traumatic transphalangeal amputation of left little finger, subsequent encounter: Secondary | ICD-10-CM | POA: Diagnosis not present

## 2019-09-04 DIAGNOSIS — S61215D Laceration without foreign body of left ring finger without damage to nail, subsequent encounter: Secondary | ICD-10-CM | POA: Diagnosis not present

## 2019-09-11 DIAGNOSIS — S68627D Partial traumatic transphalangeal amputation of left little finger, subsequent encounter: Secondary | ICD-10-CM | POA: Diagnosis not present

## 2019-09-11 DIAGNOSIS — S61215D Laceration without foreign body of left ring finger without damage to nail, subsequent encounter: Secondary | ICD-10-CM | POA: Diagnosis not present

## 2019-09-15 ENCOUNTER — Ambulatory Visit (INDEPENDENT_AMBULATORY_CARE_PROVIDER_SITE_OTHER): Payer: Medicare HMO

## 2019-09-15 ENCOUNTER — Other Ambulatory Visit: Payer: Self-pay | Admitting: Family Medicine

## 2019-09-15 ENCOUNTER — Other Ambulatory Visit: Payer: Self-pay

## 2019-09-15 VITALS — BP 144/64 | HR 71 | Temp 96.5°F | Ht 69.0 in | Wt 182.8 lb

## 2019-09-15 DIAGNOSIS — Z0000 Encounter for general adult medical examination without abnormal findings: Secondary | ICD-10-CM

## 2019-09-15 DIAGNOSIS — I10 Essential (primary) hypertension: Secondary | ICD-10-CM

## 2019-09-15 DIAGNOSIS — K219 Gastro-esophageal reflux disease without esophagitis: Secondary | ICD-10-CM

## 2019-09-15 NOTE — Progress Notes (Signed)
Subjective:   Marco Sims is a 86 y.o. male who presents for Medicare Annual/Subsequent preventive examination.  Review of Systems:   Cardiac Risk Factors include: advanced age (>42men, >56 women);male gender;hypertension     Objective:    Vitals: BP (!) 144/64 (BP Location: Left Arm, Patient Position: Sitting, Cuff Size: Normal)   Pulse 71   Temp (!) 96.5 F (35.8 C) (Temporal)   Ht 5\' 9"  (1.753 m)   Wt 182 lb 12.8 oz (82.9 kg)   SpO2 99%   BMI 26.99 kg/m   Body mass index is 26.99 kg/m.  Advanced Directives 09/15/2019 08/30/2019 09/30/2018 09/11/2018 07/11/2018 06/14/2018 08/30/2017  Does Patient Have a Medical Advance Directive? No No Yes Yes No No No  Type of Advance Directive - - - - - - -  Does patient want to make changes to medical advance directive? - - No - Patient declined Yes (MAU/Ambulatory/Procedural Areas - Information given) - - -  Would patient like information on creating a medical advance directive? Yes (MAU/Ambulatory/Procedural Areas - Information given) - - - Yes (MAU/Ambulatory/Procedural Areas - Information given) - Yes (MAU/Ambulatory/Procedural Areas - Information given)    Tobacco Social History   Tobacco Use  Smoking Status Never Smoker  Smokeless Tobacco Never Used  Tobacco Comment   smoking cessation materials not required     Counseling given: Not Answered Comment: smoking cessation materials not required   Clinical Intake:  Pre-visit preparation completed: Yes  Pain : No/denies pain(has pain at night in left ring finger and left little finger)     BMI - recorded: 26.99 Nutritional Status: BMI 25 -29 Overweight Nutritional Risks: None Diabetes: No  How often do you need to have someone help you when you read instructions, pamphlets, or other written materials from your doctor or pharmacy?: 1 - Never  Interpreter Needed?: No  Information entered by :: Clemetine Marker LPN  Past Medical History:  Diagnosis Date  . Allergy    seaasonal  . Anemia   . Colon polyp   . GERD (gastroesophageal reflux disease)   . Hypertension   . Lichen simplex chronicus   . Wears hearing aid in both ears    Past Surgical History:  Procedure Laterality Date  . Colles' fracture right 1956    . COLONOSCOPY  2013  . COLONOSCOPY WITH PROPOFOL N/A 07/11/2018   Procedure: COLONOSCOPY WITH PROPOFOL;  Surgeon: Lucilla Lame, MD;  Location: [facility];  Service: Endoscopy;  Laterality: N/A;  . FRACTURE SURGERY    . fractured left arm radius and  ulna 1955    . fractured right tibiofibular 2009    . HAND SURGERY  06/02/11  . HERNIA REPAIR Right 2010, 2015  . HERNIA REPAIR Left 2015  . herniorpahy, rt  05/09/2009  . NERVE REPAIR Right 08/12/2015   Procedure: EXPLORATION REPAIR  NERVE, DEBRIDEMENT FRACTURE RIGHT INDEX MIDDLE FINGER;  Surgeon: Daryll Brod, MD;  Location: [facility];  Service: Orthopedics;  Laterality: Right;  . POLYPECTOMY  07/11/2018   Procedure: POLYPECTOMY;  Surgeon: Lucilla Lame, MD;  Location: [facility];  Service: Endoscopy;;  . ruptured left Achilles tendon 1980    . surgery for cubital tunnel syndrome and left arm 1999    . tonsillectomy 1942     Family History  Problem Relation Age of Onset  . Parkinson's disease Mother   . Arthritis Father   . Cancer Brother    Social History   Socioeconomic History  .  Marital status: Soil scientist    Spouse name: Not on file  . Number of children: 3  . Years of education: Not on file  . Highest education level: Bachelor's degree (e.g., BA, AB, BS)  Occupational History  . Occupation: Retired  Tobacco Use  . Smoking status: Never Smoker  . Smokeless tobacco: Never Used  . Tobacco comment: smoking cessation materials not required  Substance and Sexual Activity  . Alcohol use: Yes    Alcohol/week: 0.0 standard drinks    Comment: rare; 1 to 2 beers a month  . Drug use: No  . Sexual activity: Not Currently  Other Topics Concern  .  Not on file  Social History Narrative  . Not on file   Social Determinants of Health   Financial Resource Strain: Low Risk   . Difficulty of Paying Living Expenses: Not hard at all  Food Insecurity: No Food Insecurity  . Worried About Charity fundraiser in the Last Year: Never true  . Ran Out of Food in the Last Year: Never true  Transportation Needs: No Transportation Needs  . Lack of Transportation (Medical): No  . Lack of Transportation (Non-Medical): No  Physical Activity: Inactive  . Days of Exercise per Week: 0 days  . Minutes of Exercise per Session: 0 min  Stress: No Stress Concern Present  . Feeling of Stress : Not at all  Social Connections: Slightly Isolated  . Frequency of Communication with Friends and Family: More than three times a week  . Frequency of Social Gatherings with Friends and Family: Never  . Attends Religious Services: More than 4 times per year  . Active Member of Clubs or Organizations: No  . Attends Archivist Meetings: Never  . Marital Status: Living with partner    Outpatient Encounter Medications as of 09/15/2019  Medication Sig  . amLODipine (NORVASC) 10 MG tablet Take 1 tablet (10 mg total) by mouth daily. Dr [name]  . Multiple Vitamin (MULTIVITAMIN WITH MINERALS) TABS tablet Take 1 tablet by mouth daily.  Marland Kitchen omeprazole (PRILOSEC) 20 MG capsule Take 1 capsule by mouth once daily  . finasteride (PROSCAR) 5 MG tablet Take 1 tablet by mouth once daily (Patient not taking: Reported on 09/15/2019)  . [DISCONTINUED] cephALEXin (KEFLEX) 500 MG capsule Take 1 capsule (500 mg total) by mouth 3 (three) times daily.  . [DISCONTINUED] oxyCODONE-acetaminophen (PERCOCET) 5-325 MG tablet Take 1 tablet by mouth every 6 (six) hours as needed for severe pain.   No facility-administered encounter medications on file as of 09/15/2019.    Activities of Daily Living In your present state of health, do you have any difficulty performing the following  activities: 09/15/2019  Hearing? Y  Comment wears hearing aids  Vision? N  Difficulty concentrating or making decisions? N  Walking or climbing stairs? N  Dressing or bathing? N  Doing errands, shopping? N  Preparing Food and eating ? N  Using the Toilet? N  In the past six months, have you accidently leaked urine? N  Do you have problems with loss of bowel control? N  Managing your Medications? N  Managing your Finances? N  Housekeeping or managing your Housekeeping? N  Some recent data might be hidden    Patient Care Team: [name], MD as PCP - General (Family Medicine) Corey Skains, MD as Consulting Physician (Cardiology)   Assessment:   This is a routine wellness examination for Blue.  Exercise Activities and Dietary recommendations Current Exercise  Habits: The patient does not participate in regular exercise at present, Exercise limited by: None identified  Goals    . Exercise 150 min/wk Moderate Activity     Recommend to exercise for at least 150 minutes per week.       Fall Risk Fall Risk  09/15/2019 09/11/2018 08/08/2018 08/30/2017 06/13/2016  Falls in the past year? 1 1 0 Yes Yes  Number falls in past yr: 1 1 - 1 1  Injury with Fall? 0 0 - No No  Risk for fall due to : History of fall(s) - - History of fall(s);Impaired vision -  Risk for fall due to: Comment - - - wears eyeglasses -  Follow up Falls prevention discussed Falls prevention discussed Falls evaluation completed Falls evaluation completed;Education provided;Falls prevention discussed Falls evaluation completed   FALL RISK PREVENTION PERTAINING TO THE HOME:  Any stairs in or around the home? Yes  If so, do they handrails? Yes   Home free of loose throw rugs in walkways, pet beds, electrical cords, etc? Yes  Adequate lighting in your home to reduce risk of falls? Yes   ASSISTIVE DEVICES UTILIZED TO PREVENT FALLS:  Life alert? No  Use of a cane, walker or w/c? No  Grab bars in the bathroom?  Yes  Shower chair or bench in shower? Yes  Elevated toilet seat or a handicapped toilet? Yes   DME ORDERS:  DME order needed?  No   TIMED UP AND GO:  Was the test performed? Yes .  Length of time to ambulate 10 feet: 6 sec.   GAIT:  Appearance of gait: Gait stead-fast and without the use of an assistive device.   Education: Fall risk prevention has been discussed.  Intervention(s) required? No    Depression Screen PHQ 2/9 Scores 09/15/2019 09/11/2018 01/09/2018 08/30/2017  PHQ - 2 Score 0 0 0 0  PHQ- 9 Score - - 0 -    Cognitive Function     6CIT Screen 09/15/2019 09/11/2018 08/30/2017  What Year? 0 points 0 points 4 points  What month? 0 points 0 points 0 points  What time? 0 points 0 points 0 points  Count back from 20 0 points 0 points 0 points  Months in reverse 0 points 0 points 0 points  Repeat phrase 0 points 0 points 0 points  Total Score 0 0 4    Immunization History  Administered Date(s) Administered  . Influenza Split 04/26/2012, 05/24/2019  . Influenza Whole 06/13/2007  . Influenza, High Dose Seasonal PF 05/01/2018, 05/24/2019  . Influenza,inj,Quad PF,6+ Mos 04/18/2013, 06/03/2015  . Influenza-Unspecified 05/07/2013, 05/07/2014, 05/07/2017  . Pneumococcal Conjugate-13 01/27/2015  . Pneumococcal Polysaccharide-23 08/30/2017, 05/24/2019  . Td 08/08/2003  . Tdap 03/20/2011  . Zoster Recombinat (Shingrix) 05/24/2019    Qualifies for Shingles Vaccine? Yes  Due for second dose of Shingrix.   Tdap: Up to date  Flu Vaccine: Up to date  Pneumococcal Vaccine: Up to date    Screening Tests Health Maintenance  Topic Date Due  . TETANUS/TDAP  03/19/2021  . INFLUENZA VACCINE  Completed  . PNA vac Low Risk Adult  Completed   Cancer Screenings:  Colorectal Screening: No longer required.   Lung Cancer Screening: (Low Dose CT Chest recommended if Age 48-80 years, 30 pack-year currently smoking OR have quit w/in 15years.) does not qualify. Pt does Chest CT for  lung nodule follow up.   Additional Screening:  Hepatitis C Screening: no long required  Vision Screening: Recommended annual  ophthalmology exams for early detection of glaucoma and other disorders of the eye. Is the patient up to date with their annual eye exam?  No  Who is the provider or what is the name of the office in which the pt attends annual eye exams? [place] [facility]  Dental Screening: Recommended annual dental exams for proper oral hygiene  Community Resource Referral:  CRR required this visit?  No       Plan:    I have personally reviewed and addressed the Medicare Annual Wellness questionnaire and have noted the following in the patient's chart:  A. Medical and social history B. Use of alcohol, tobacco or illicit drugs  C. Current medications and supplements D. Functional ability and status E.  Nutritional status F.  Physical activity G. Advance directives H. List of other physicians I.  Hospitalizations, surgeries, and ER visits in previous 12 months J.  [place] such as hearing and vision if needed, cognitive and depression L. Referrals and appointments   In addition, I have reviewed and discussed with patient certain preventive protocols, quality metrics, and best practice recommendations. A written personalized care plan for preventive services as well as general preventive health recommendations were provided to patient.   Signed,  Clemetine Marker, LPN Nurse Health Advisor   Nurse Notes: pt states he is feeling much better after having Covid in December and reports only having pain at night in left ring and left little fingers after recent lacerations including partial amputation. Advised patient due for appt and labs with Dr. [name] for follow up.

## 2019-09-15 NOTE — Patient Instructions (Signed)
Marco Sims , Thank you for taking time to come for your Medicare Wellness Visit. I appreciate your ongoing commitment to your health goals. Please review the following plan we discussed and let me know if I can assist you in the future.   Screening recommendations/referrals: Colonoscopy: no longer required Recommended yearly ophthalmology/optometry visit for glaucoma screening and checkup Recommended yearly dental visit for hygiene and checkup  Vaccinations: Influenza vaccine: done 05/24/19 Pneumococcal vaccine: done 08/30/17 Tdap vaccine: done 03/20/11 Shingles vaccine: Due for second dose of Shingrix    Advanced directives: Advance directive discussed with you today. I have provided a copy for you to complete at home and have notarized. Once this is complete please bring a copy in to our office so we can scan it into your chart.  Conditions/risks identified: Recommend increasing physical activity to at least 3 days per week  Next appointment: Please follow up in one year for your Medicare Annual Wellness visit.    Preventive Care 24 Years and Older, Male Preventive care refers to lifestyle choices and visits with your health care provider that can promote health and wellness. What does preventive care include?  A yearly physical exam. This is also called an annual well check.  Dental exams once or twice a year.  Routine eye exams. Ask your health care provider how often you should have your eyes checked.  Personal lifestyle choices, including:  Daily care of your teeth and gums.  Regular physical activity.  Eating a healthy diet.  Avoiding tobacco and drug use.  Limiting alcohol use.  Practicing safe sex.  Taking low doses of aspirin every day.  Taking vitamin and mineral supplements as recommended by your health care provider. What happens during an annual well check? The services and screenings done by your health care provider during your annual well check will  depend on your age, overall health, lifestyle risk factors, and family history of disease. Counseling  Your health care provider may ask you questions about your:  Alcohol use.  Tobacco use.  Drug use.  Emotional well-being.  Home and relationship well-being.  Sexual activity.  Eating habits.  History of falls.  Memory and ability to understand (cognition).  Work and work Statistician. Screening  You may have the following tests or measurements:  Height, weight, and BMI.  Blood pressure.  Lipid and cholesterol levels. These may be checked every 5 years, or more frequently if you are over 53 years old.  Skin check.  Lung cancer screening. You may have this screening every year starting at age 58 if you have a 30-pack-year history of smoking and currently smoke or have quit within the past 15 years.  Fecal occult blood test (FOBT) of the stool. You may have this test every year starting at age 51.  Flexible sigmoidoscopy or colonoscopy. You may have a sigmoidoscopy every 5 years or a colonoscopy every 10 years starting at age 57.  Prostate cancer screening. Recommendations will vary depending on your family history and other risks.  Hepatitis C blood test.  Hepatitis B blood test.  Sexually transmitted disease (STD) testing.  Diabetes screening. This is done by checking your blood sugar (glucose) after you have not eaten for a while (fasting). You may have this done every 1-3 years.  Abdominal aortic aneurysm (AAA) screening. You may need this if you are a current or former smoker.  Osteoporosis. You may be screened starting at age 24 if you are at high risk. Talk with your health  care provider about your test results, treatment options, and if necessary, the need for more tests. Vaccines  Your health care provider may recommend certain vaccines, such as:  Influenza vaccine. This is recommended every year.  Tetanus, diphtheria, and acellular pertussis (Tdap,  Td) vaccine. You may need a Td booster every 10 years.  Zoster vaccine. You may need this after age 65.  Pneumococcal 13-valent conjugate (PCV13) vaccine. One dose is recommended after age 63.  Pneumococcal polysaccharide (PPSV23) vaccine. One dose is recommended after age 46. Talk to your health care provider about which screenings and vaccines you need and how often you need them. This information is not intended to replace advice given to you by your health care provider. Make sure you discuss any questions you have with your health care provider. Document Released: 08/20/2015 Document Revised: 04/12/2016 Document Reviewed: 05/25/2015 Elsevier Interactive Patient Education  2017 [place] Prevention in the Home Falls can cause injuries. They can happen to people of all ages. There are many things you can do to make your home safe and to help prevent falls. What can I do on the outside of my home?  Regularly fix the edges of walkways and driveways and fix any cracks.  Remove anything that might make you trip as you walk through a door, such as a raised step or threshold.  Trim any bushes or trees on the path to your home.  Use bright outdoor lighting.  Clear any walking paths of anything that might make someone trip, such as rocks or tools.  Regularly check to see if handrails are loose or broken. Make sure that both sides of any steps have handrails.  Any raised decks and porches should have guardrails on the edges.  Have any leaves, snow, or ice cleared regularly.  Use sand or salt on walking paths during winter.  Clean up any spills in your garage right away. This includes oil or grease spills. What can I do in the bathroom?  Use night lights.  Install grab bars by the toilet and in the tub and shower. Do not use towel bars as grab bars.  Use non-skid mats or decals in the tub or shower.  If you need to sit down in the shower, use a plastic, non-slip  stool.  Keep the floor dry. Clean up any water that spills on the floor as soon as it happens.  Remove soap buildup in the tub or shower regularly.  Attach bath mats securely with double-sided non-slip rug tape.  Do not have throw rugs and other things on the floor that can make you trip. What can I do in the bedroom?  Use night lights.  Make sure that you have a light by your bed that is easy to reach.  Do not use any sheets or blankets that are too big for your bed. They should not hang down onto the floor.  Have a firm chair that has side arms. You can use this for support while you get dressed.  Do not have throw rugs and other things on the floor that can make you trip. What can I do in the kitchen?  Clean up any spills right away.  Avoid walking on wet floors.  Keep items that you use a lot in easy-to-reach places.  If you need to reach something above you, use a strong step stool that has a grab bar.  Keep electrical cords out of the way.  Do not use floor  polish or wax that makes floors slippery. If you must use wax, use non-skid floor wax.  Do not have throw rugs and other things on the floor that can make you trip. What can I do with my stairs?  Do not leave any items on the stairs.  Make sure that there are handrails on both sides of the stairs and use them. Fix handrails that are broken or loose. Make sure that handrails are as long as the stairways.  Check any carpeting to make sure that it is firmly attached to the stairs. Fix any carpet that is loose or worn.  Avoid having throw rugs at the top or bottom of the stairs. If you do have throw rugs, attach them to the floor with carpet tape.  Make sure that you have a light switch at the top of the stairs and the bottom of the stairs. If you do not have them, ask someone to add them for you. What else can I do to help prevent falls?  Wear shoes that:  Do not have high heels.  Have rubber bottoms.  Are  comfortable and fit you well.  Are closed at the toe. Do not wear sandals.  If you use a stepladder:  Make sure that it is fully opened. Do not climb a closed stepladder.  Make sure that both sides of the stepladder are locked into place.  Ask someone to hold it for you, if possible.  Clearly mark and make sure that you can see:  Any grab bars or handrails.  First and last steps.  Where the edge of each step is.  Use tools that help you move around (mobility aids) if they are needed. These include:  Canes.  Walkers.  Scooters.  Crutches.  Turn on the lights when you go into a dark area. Replace any light bulbs as soon as they burn out.  Set up your furniture so you have a clear path. Avoid moving your furniture around.  If any of your floors are uneven, fix them.  If there are any pets around you, be aware of where they are.  Review your medicines with your doctor. Some medicines can make you feel dizzy. This can increase your chance of falling. Ask your doctor what other things that you can do to help prevent falls. This information is not intended to replace advice given to you by your health care provider. Make sure you discuss any questions you have with your health care provider. Document Released: 05/20/2009 Document Revised: 12/30/2015 Document Reviewed: 08/28/2014 Elsevier Interactive Patient Education  2017 [employer].

## 2019-09-16 ENCOUNTER — Encounter: Payer: Self-pay | Admitting: Family Medicine

## 2019-09-16 ENCOUNTER — Ambulatory Visit (INDEPENDENT_AMBULATORY_CARE_PROVIDER_SITE_OTHER): Payer: Medicare HMO | Admitting: Family Medicine

## 2019-09-16 VITALS — BP 132/68 | HR 76 | Ht 69.0 in | Wt 183.0 lb

## 2019-09-16 DIAGNOSIS — E7801 Familial hypercholesterolemia: Secondary | ICD-10-CM | POA: Diagnosis not present

## 2019-09-16 DIAGNOSIS — K219 Gastro-esophageal reflux disease without esophagitis: Secondary | ICD-10-CM | POA: Diagnosis not present

## 2019-09-16 DIAGNOSIS — I10 Essential (primary) hypertension: Secondary | ICD-10-CM

## 2019-09-16 MED ORDER — AMLODIPINE BESYLATE 10 MG PO TABS: 10.0000 mg | ORAL_TABLET | Freq: Every day | ORAL | 1 refills | Status: DC

## 2019-09-16 MED ORDER — OMEPRAZOLE 20 MG PO CPDR: 20.0000 mg | DELAYED_RELEASE_CAPSULE | Freq: Every day | ORAL | 1 refills | Status: DC

## 2019-09-16 NOTE — Progress Notes (Signed)
Date:  09/16/2019   Name:  Marco Sims   DOB:  08/05/1934   MRN:  [id number]   Chief Complaint: Hypertension and Gastroesophageal Reflux  Hypertension This is a chronic problem. The current episode started more than 1 year ago. The problem has been gradually improving since onset. The problem is controlled. Pertinent negatives include no anxiety, blurred vision, chest pain, headaches, malaise/fatigue, neck pain, orthopnea, palpitations, peripheral edema, PND, shortness of breath or sweats. There are no associated agents to hypertension. Risk factors for coronary artery disease include dyslipidemia. Past treatments include calcium channel blockers. The current treatment provides moderate improvement. There are no compliance problems.  There is no history of angina, kidney disease, CAD/MI, CVA, heart failure, left ventricular hypertrophy, PVD or retinopathy. There is no history of chronic renal disease, a hypertension causing med or renovascular disease.  Gastroesophageal Reflux He reports no abdominal pain, no belching, no chest pain, no choking, no coughing, no dysphagia, no heartburn, no hoarse voice, no nausea, no sore throat or no wheezing. This is a chronic problem. The current episode started more than 1 year ago. The problem has been gradually improving. The symptoms are aggravated by certain foods. Pertinent negatives include no anemia, fatigue, melena, muscle weakness, orthopnea or weight loss. He has tried a PPI for the symptoms. The treatment provided moderate relief.    Lab Results  Component Value Date   CREATININE 0.93 06/14/2018   BUN 16 06/14/2018   NA 140 06/14/2018   K 3.8 06/14/2018   CL 107 06/14/2018   CO2 26 06/14/2018   Lab Results  Component Value Date   CHOL 257 (H) 01/09/2018   HDL 46 01/09/2018   LDLCALC 174 (H) 01/09/2018   LDLDIRECT 168.9 01/10/2011   TRIG 186 (H) 01/09/2018   CHOLHDL 5.6 (H) 01/09/2018   Lab Results  Component Value Date   TSH 2.62  01/10/2011   No results found for: HGBA1C   Review of Systems  Constitutional: Negative for chills, fatigue, fever, malaise/fatigue and weight loss.  HENT: Negative for drooling, ear discharge, ear pain, hoarse voice and sore throat.   Eyes: Negative for blurred vision.  Respiratory: Negative for cough, choking, shortness of breath and wheezing.   Cardiovascular: Negative for chest pain, palpitations, orthopnea, leg swelling and PND.  Gastrointestinal: Negative for abdominal pain, blood in stool, constipation, diarrhea, dysphagia, heartburn, melena and nausea.  Endocrine: Negative for polydipsia.  Genitourinary: Negative for dysuria, frequency, hematuria and urgency.  Musculoskeletal: Negative for back pain, myalgias, muscle weakness and neck pain.  Skin: Negative for rash.  Allergic/Immunologic: Negative for environmental allergies.  Neurological: Negative for dizziness and headaches.  Hematological: Does not bruise/bleed easily.  Psychiatric/Behavioral: Negative for suicidal ideas. The patient is not nervous/anxious.     Patient Active Problem List   Diagnosis Date Noted  . Hematochezia   . Benign neoplasm of ascending colon   . Essential hypertension 01/09/2018  . GERD 11/04/2009  . ERECTILE DYSFUNCTION 07/28/2008  . DERMATITIS 07/28/2008  . LABYRINTHITIS 11/05/2007  . HYPERLIPIDEMIA 09/24/2007  . ANEMIA 09/24/2007  . CONDUCTIVE HEARING LOSS UNILATERAL 09/24/2007  . ARTHRITIS 09/24/2007  . UNS ADVRS EFF OTH RX MEDICINAL&BIOLOGICAL SBSTNC 09/24/2007    No Known Allergies  Past Surgical History:  Procedure Laterality Date  . Colles' fracture right 1956    . COLONOSCOPY  2013  . COLONOSCOPY WITH PROPOFOL N/A 07/11/2018   Procedure: COLONOSCOPY WITH PROPOFOL;  Surgeon: Lucilla Lame, MD;  Location: [place];  Service:  Endoscopy;  Laterality: N/A;  . FRACTURE SURGERY    . fractured left arm radius and  ulna 1955    . fractured right tibiofibular 2009    . HAND  SURGERY  06/02/11  . HERNIA REPAIR Right 2010, 2015  . HERNIA REPAIR Left 2015  . herniorpahy, rt  05/09/2009  . NERVE REPAIR Right 08/12/2015   Procedure: EXPLORATION REPAIR  NERVE, DEBRIDEMENT FRACTURE RIGHT INDEX MIDDLE FINGER;  Surgeon: Daryll Brod, MD;  Location: [place];  Service: Orthopedics;  Laterality: Right;  . POLYPECTOMY  07/11/2018   Procedure: POLYPECTOMY;  Surgeon: Lucilla Lame, MD;  Location: [place];  Service: Endoscopy;;  . ruptured left Achilles tendon 1980    . surgery for cubital tunnel syndrome and left arm 1999    . tonsillectomy 1942      Social History   Tobacco Use  . Smoking status: Never Smoker  . Smokeless tobacco: Never Used  . Tobacco comment: smoking cessation materials not required  Substance Use Topics  . Alcohol use: Yes    Alcohol/week: 0.0 standard drinks    Comment: rare; 1 to 2 beers a month  . Drug use: No     Medication list has been reviewed and updated.  Current Meds  Medication Sig  . amLODipine (NORVASC) 10 MG tablet Take 1 tablet by mouth once daily  . omeprazole (PRILOSEC) 20 MG capsule Take 1 capsule by mouth once daily    PHQ 2/9 Scores 09/16/2019 09/15/2019 09/11/2018 01/09/2018  PHQ - 2 Score 0 0 0 0  PHQ- 9 Score 0 - - 0    BP Readings from Last 3 Encounters:  09/16/19 132/68  09/15/19 (!) 144/64  08/30/19 (!) 155/76    Physical Exam Vitals and nursing note reviewed.  HENT:     Head: Normocephalic.     Right Ear: External ear normal.     Left Ear: External ear normal.     Nose: Nose normal.  Eyes:     General: No scleral icterus.       Right eye: No discharge.        Left eye: No discharge.     Conjunctiva/sclera: Conjunctivae normal.     Pupils: Pupils are equal, round, and reactive to light.  Neck:     Thyroid: No thyromegaly.     Vascular: No JVD.     Trachea: No tracheal deviation.  Cardiovascular:     Rate and Rhythm: Normal rate and regular rhythm.     Heart sounds: Normal  heart sounds. No murmur. No friction rub. No gallop.   Pulmonary:     Effort: No respiratory distress.     Breath sounds: Normal breath sounds. No wheezing or rales.  Abdominal:     General: Bowel sounds are normal.     Palpations: Abdomen is soft. There is no mass.     Tenderness: There is no abdominal tenderness. There is no guarding or rebound.  Musculoskeletal:        General: No tenderness. Normal range of motion.     Cervical back: Normal range of motion and neck supple.  Lymphadenopathy:     Cervical: No cervical adenopathy.  Skin:    General: Skin is warm.     Findings: No rash.  Neurological:     Mental Status: He is alert and oriented to person, place, and time.     Cranial Nerves: No cranial nerve deficit.     Deep Tendon Reflexes: Reflexes are  normal and symmetric.     Wt Readings from Last 3 Encounters:  09/16/19 183 lb (83 kg)  09/15/19 182 lb 12.8 oz (82.9 kg)  08/30/19 180 lb (81.6 kg)    BP 132/68   Pulse 76   Ht 5\' 9"  (1.753 m)   Wt 183 lb (83 kg)   BMI 27.02 kg/m   Assessment and Plan:  1. Essential hypertension Chronic.  Controlled.  Stable.  Continue amlodipine 10 mg once a day.  Will check renal function panel to assess GFR.  Will recheck in 6 months. - amLODipine (NORVASC) 10 MG tablet; Take 1 tablet (10 mg total) by mouth daily.  Dispense: 90 tablet; Refill: 1 - Renal Function Panel  2. Gastroesophageal reflux disease Chronic.  Controlled.  Stable.  Continue olmesartan 20 mg once a day. - omeprazole (PRILOSEC) 20 MG capsule; Take 1 capsule (20 mg total) by mouth daily.  Dispense: 90 capsule; Refill: 1  3. Familial hypercholesterolemia Chronic.  Controlled.  Stable.  Controlled with diet at present.  Will check lipid panel to assess LDL and ratio. - Lipid Panel With LDL/HDL Ratio

## 2019-09-17 LAB — RENAL FUNCTION PANEL
Albumin: 4.2 g/dL (ref 3.6–4.6)
BUN/Creatinine Ratio: 21 (ref 10–24)
BUN: 18 mg/dL (ref 8–27)
CO2: 24 mmol/L (ref 20–29)
Calcium: 9.2 mg/dL (ref 8.6–10.2)
Chloride: 108 mmol/L — ABNORMAL HIGH (ref 96–106)
Creatinine, Ser: 0.86 mg/dL (ref 0.76–1.27)
GFR calc Af Amer: 93 mL/min/{1.73_m2} (ref >59)
GFR calc non Af Amer: 80 mL/min/{1.73_m2} (ref >59)
Glucose: 99 mg/dL (ref 65–99)
Phosphorus: 2.8 mg/dL (ref 2.8–4.1)
Potassium: 4.4 mmol/L (ref 3.5–5.2)
Sodium: 144 mmol/L (ref 134–144)

## 2019-09-17 LAB — LIPID PANEL WITH LDL/HDL RATIO
Cholesterol, Total: 242 mg/dL — ABNORMAL HIGH (ref 100–199)
HDL: 47 mg/dL (ref >39)
LDL Chol Calc (NIH): 167 mg/dL — ABNORMAL HIGH (ref 0–99)
LDL/HDL Ratio: 3.6 ratio (ref 0.0–3.6)
Triglycerides: 156 mg/dL — ABNORMAL HIGH (ref 0–149)
VLDL Cholesterol Cal: 28 mg/dL (ref 5–40)

## 2019-09-18 ENCOUNTER — Telehealth: Payer: Self-pay | Admitting: *Deleted

## 2019-09-18 NOTE — Telephone Encounter (Signed)
Pt has been made aware of upcoming appts for follow up CT scan and follow up appt with Jennifer Burns, NP in the [facility]. Pt verbalized understanding. Nothing further needed at this time. 

## 2019-09-24 ENCOUNTER — Other Ambulatory Visit: Payer: Self-pay | Admitting: Orthopedic Surgery

## 2019-09-24 DIAGNOSIS — S68627D Partial traumatic transphalangeal amputation of left little finger, subsequent encounter: Secondary | ICD-10-CM | POA: Diagnosis not present

## 2019-09-26 ENCOUNTER — Other Ambulatory Visit: Payer: Self-pay

## 2019-09-26 ENCOUNTER — Ambulatory Visit: Payer: Medicare HMO | Admitting: Anesthesiology

## 2019-09-26 ENCOUNTER — Ambulatory Visit
Admission: RE | Admit: 2019-09-26 | Discharge: 2019-09-26 | Disposition: A | Discharge status: Home / Self Care | Payer: Medicare HMO | Attending: Orthopedic Surgery | Admitting: Orthopedic Surgery

## 2019-09-26 ENCOUNTER — Encounter
Admission: RE | Disposition: A | Discharge status: Home / Self Care | Payer: Self-pay | Source: Home / Self Care | Attending: Orthopedic Surgery

## 2019-09-26 ENCOUNTER — Encounter: Payer: Self-pay | Admitting: Orthopedic Surgery

## 2019-09-26 DIAGNOSIS — Z01810 Encounter for preprocedural cardiovascular examination: Secondary | ICD-10-CM | POA: Diagnosis not present

## 2019-09-26 DIAGNOSIS — D649 Anemia, unspecified: Secondary | ICD-10-CM | POA: Diagnosis not present

## 2019-09-26 DIAGNOSIS — N529 Male erectile dysfunction, unspecified: Secondary | ICD-10-CM | POA: Diagnosis not present

## 2019-09-26 DIAGNOSIS — S68127D Partial traumatic metacarpophalangeal amputation of left little finger, subsequent encounter: Secondary | ICD-10-CM | POA: Diagnosis not present

## 2019-09-26 DIAGNOSIS — Y9389 Activity, other specified: Secondary | ICD-10-CM | POA: Diagnosis not present

## 2019-09-26 DIAGNOSIS — M1990 Unspecified osteoarthritis, unspecified site: Secondary | ICD-10-CM | POA: Insufficient documentation

## 2019-09-26 DIAGNOSIS — W312XXD Contact with powered woodworking and forming machines, subsequent encounter: Secondary | ICD-10-CM | POA: Diagnosis not present

## 2019-09-26 DIAGNOSIS — K219 Gastro-esophageal reflux disease without esophagitis: Secondary | ICD-10-CM | POA: Insufficient documentation

## 2019-09-26 DIAGNOSIS — Z79899 Other long term (current) drug therapy: Secondary | ICD-10-CM | POA: Diagnosis not present

## 2019-09-26 DIAGNOSIS — S68127A Partial traumatic metacarpophalangeal amputation of left little finger, initial encounter: Secondary | ICD-10-CM | POA: Diagnosis not present

## 2019-09-26 DIAGNOSIS — I10 Essential (primary) hypertension: Secondary | ICD-10-CM | POA: Diagnosis not present

## 2019-09-26 DIAGNOSIS — S68627A Partial traumatic transphalangeal amputation of left little finger, initial encounter: Secondary | ICD-10-CM | POA: Diagnosis not present

## 2019-09-26 DIAGNOSIS — W312XXA Contact with powered woodworking and forming machines, initial encounter: Secondary | ICD-10-CM | POA: Diagnosis not present

## 2019-09-26 HISTORY — PX: AMPUTATION: SHX166

## 2019-09-26 LAB — CBC
HCT: 37.7 % — ABNORMAL LOW (ref 39.0–52.0)
Hemoglobin: 12.1 g/dL — ABNORMAL LOW (ref 13.0–17.0)
MCH: 25.6 pg — ABNORMAL LOW (ref 26.0–34.0)
MCHC: 32.1 g/dL (ref 30.0–36.0)
MCV: 79.7 fL — ABNORMAL LOW (ref 80.0–100.0)
Platelets: 228 10*3/uL (ref 150–400)
RBC: 4.73 MIL/uL (ref 4.22–5.81)
RDW: 15.3 % (ref 11.5–15.5)
WBC: 5 10*3/uL (ref 4.0–10.5)
nRBC: 0 % (ref 0.0–0.2)

## 2019-09-26 SURGERY — AMPUTATION DIGIT
Anesthesia: General | Site: Little Finger | Laterality: Left

## 2019-09-26 MED ORDER — EPHEDRINE SULFATE 50 MG/ML IJ SOLN
INTRAMUSCULAR | Status: DC | PRN
  Administered 2019-09-26: 10 mg via INTRAVENOUS

## 2019-09-26 MED ORDER — LACTATED RINGERS IV SOLN: INTRAVENOUS | Status: DC

## 2019-09-26 MED ORDER — DEXAMETHASONE SODIUM PHOSPHATE 10 MG/ML IJ SOLN
INTRAMUSCULAR | Status: DC | PRN
  Administered 2019-09-26: 10 mg via INTRAVENOUS

## 2019-09-26 MED ORDER — FENTANYL CITRATE (PF) 100 MCG/2ML IJ SOLN
INTRAMUSCULAR | Status: AC
  Filled 2019-09-26: qty 2

## 2019-09-26 MED ORDER — FENTANYL CITRATE (PF) 100 MCG/2ML IJ SOLN
INTRAMUSCULAR | Status: DC | PRN
  Administered 2019-09-26 (×2): 50 ug via INTRAVENOUS

## 2019-09-26 MED ORDER — CEFAZOLIN SODIUM-DEXTROSE 2-4 GM/100ML-% IV SOLN
INTRAVENOUS | Status: AC
  Filled 2019-09-26: qty 100

## 2019-09-26 MED ORDER — LIDOCAINE HCL (PF) 2 % IJ SOLN
INTRAMUSCULAR | Status: AC
  Filled 2019-09-26: qty 10

## 2019-09-26 MED ORDER — CEFAZOLIN SODIUM-DEXTROSE 2-4 GM/100ML-% IV SOLN
2.0000 g | INTRAVENOUS | Status: AC
  Administered 2019-09-26: 2 g via INTRAVENOUS

## 2019-09-26 MED ORDER — CHLORHEXIDINE GLUCONATE 4 % EX LIQD
60.0000 mL | Freq: Once | CUTANEOUS | Status: AC
  Administered 2019-09-26: 4 via TOPICAL

## 2019-09-26 MED ORDER — HYDROCODONE-ACETAMINOPHEN 5-325 MG PO TABS: 1.0000 | ORAL_TABLET | Freq: Four times a day (QID) | ORAL | 0 refills | Status: DC | PRN

## 2019-09-26 MED ORDER — PROPOFOL 10 MG/ML IV BOLUS
INTRAVENOUS | Status: DC | PRN
  Administered 2019-09-26: 140 mg via INTRAVENOUS

## 2019-09-26 MED ORDER — BUPIVACAINE HCL (PF) 0.5 % IJ SOLN
INTRAMUSCULAR | Status: DC | PRN
  Administered 2019-09-26: 20 mL

## 2019-09-26 MED ORDER — BUPIVACAINE HCL (PF) 0.5 % IJ SOLN
INTRAMUSCULAR | Status: AC
  Filled 2019-09-26: qty 30

## 2019-09-26 MED ORDER — ONDANSETRON HCL 4 MG/2ML IJ SOLN: 4.0000 mg | Freq: Once | INTRAMUSCULAR | Status: DC | PRN

## 2019-09-26 MED ORDER — LIDOCAINE HCL (CARDIAC) PF 100 MG/5ML IV SOSY
PREFILLED_SYRINGE | INTRAVENOUS | Status: DC | PRN
  Administered 2019-09-26: 100 mg via INTRAVENOUS

## 2019-09-26 MED ORDER — LACTATED RINGERS IV SOLN: INTRAVENOUS | Status: DC | PRN

## 2019-09-26 MED ORDER — ONDANSETRON HCL 4 MG/2ML IJ SOLN
INTRAMUSCULAR | Status: DC | PRN
  Administered 2019-09-26: 4 mg via INTRAVENOUS

## 2019-09-26 MED ORDER — FENTANYL CITRATE (PF) 100 MCG/2ML IJ SOLN: 25.0000 ug | INTRAMUSCULAR | Status: DC | PRN

## 2019-09-26 MED ORDER — PROPOFOL 500 MG/50ML IV EMUL
INTRAVENOUS | Status: AC
  Filled 2019-09-26: qty 50

## 2019-09-26 MED ORDER — LIDOCAINE HCL (PF) 1 % IJ SOLN
INTRAMUSCULAR | Status: AC
  Filled 2019-09-26: qty 30

## 2019-09-26 SURGICAL SUPPLY — 26 items
BNDG ELASTIC 2X5.8 VLCR STR LF (GAUZE/BANDAGES/DRESSINGS) ×1 IMPLANT
BNDG GAUZE 1X2.1 STRL (MISCELLANEOUS) ×2 IMPLANT
CANISTER SUCT 1200ML W/VALVE (MISCELLANEOUS) ×2 IMPLANT
CHLORAPREP W/TINT 26 (MISCELLANEOUS) ×2 IMPLANT
COVER WAND RF STERILE (DRAPES) ×2 IMPLANT
CUFF TOURN 18 STER (MISCELLANEOUS) ×2 IMPLANT
DRSG GAUZE FLUFF 36X18 (GAUZE/BANDAGES/DRESSINGS) ×2 IMPLANT
ELECT CAUTERY BLADE 6.4 (BLADE) ×2 IMPLANT
GAUZE SPONGE 4X4 12PLY STRL (GAUZE/BANDAGES/DRESSINGS) ×2 IMPLANT
GAUZE XEROFORM 1X8 LF (GAUZE/BANDAGES/DRESSINGS) ×2 IMPLANT
GLOVE BIOGEL PI IND STRL 9 (GLOVE) ×1 IMPLANT
GLOVE BIOGEL PI INDICATOR 9 (GLOVE) ×1
GLOVE SURG SYN 9.0  PF PI (GLOVE) ×1
GLOVE SURG SYN 9.0 PF PI (GLOVE) ×1 IMPLANT
GOWN SRG 2XL LVL 4 RGLN SLV (GOWNS) ×1 IMPLANT
GOWN STRL NON-REIN 2XL LVL4 (GOWNS) ×1
GOWN STRL REUS W/ TWL LRG LVL3 (GOWN DISPOSABLE) ×1 IMPLANT
GOWN STRL REUS W/TWL LRG LVL3 (GOWN DISPOSABLE) ×1
KIT TURNOVER KIT A (KITS) ×2 IMPLANT
NDL HYPO 25X1 1.5 SAFETY (NEEDLE) ×1 IMPLANT
NEEDLE HYPO 25X1 1.5 SAFETY (NEEDLE) ×2 IMPLANT
PACK EXTREMITY ARMC (MISCELLANEOUS) ×2 IMPLANT
STOCKINETTE 48X4 2 PLY STRL (GAUZE/BANDAGES/DRESSINGS) ×1 IMPLANT
STOCKINETTE STRL 4IN 9604848 (GAUZE/BANDAGES/DRESSINGS) ×2 IMPLANT
SUT ETHILON 3-0 FS-10 30 BLK (SUTURE) ×2
SUTURE EHLN 3-0 FS-10 30 BLK (SUTURE) IMPLANT

## 2019-09-26 NOTE — H&P (Signed)
Reviewed paper H+P, will be scanned into chart. No changes noted.  

## 2019-09-26 NOTE — Anesthesia Postprocedure Evaluation (Signed)
Anesthesia Post Note  Patient: Marco Sims  Procedure(s) Performed: LEFT LITTLE FINGER TIP, AMPUTATION (Left Little Finger)  Patient location during evaluation: PACU Anesthesia Type: General Level of consciousness: awake and alert and oriented Pain management: pain level controlled Vital Signs Assessment: post-procedure vital signs reviewed and stable Respiratory status: spontaneous breathing, nonlabored ventilation and respiratory function stable Cardiovascular status: blood pressure returned to baseline and stable Postop Assessment: no signs of nausea or vomiting Anesthetic complications: no     Last Vitals:  Vitals:   09/26/19 1347 09/26/19 1413  BP: (!) 137/54 (!) 154/59  Pulse: 60 74  Resp: 12 14  Temp:  (!) 36.1 C  SpO2: 97% 100%    Last Pain:  Vitals:   09/26/19 1413  TempSrc: Temporal  PainSc: 0-No pain                 Kimarion Chery

## 2019-09-26 NOTE — Anesthesia Procedure Notes (Signed)
Procedure Name: LMA Insertion Date/Time: 09/26/2019 12:15 PM Performed by: Justus Memory, CRNA Pre-anesthesia Checklist: Patient identified, Patient being monitored, Timeout performed, Emergency Drugs available and Suction available Patient Re-evaluated:Patient Re-evaluated prior to induction Oxygen Delivery Method: Circle system utilized Preoxygenation: Pre-oxygenation with 100% oxygen Induction Type: IV induction Ventilation: Mask ventilation without difficulty LMA: LMA inserted LMA Size: 4.5 Tube type: Oral Number of attempts: 1 Placement Confirmation: positive ETCO2 and breath sounds checked- equal and bilateral Tube secured with: Tape Dental Injury: Teeth and Oropharynx as per pre-operative assessment

## 2019-09-26 NOTE — Op Note (Signed)
09/26/2019  12:50 PM  PATIENT:  Marco Sims  86 y.o. male  PRE-OPERATIVE DIAGNOSIS:  PARTIAL TRAUMATIC AMPUTATION OF LEFT PHALANX.  POST-OPERATIVE DIAGNOSIS:  PARTIAL TRAUMATIC AMPUTATION OF LEFT   PROCEDURE:  Procedure(s): LEFT LITTLE FINGER TIP, AMPUTATION (Left)  SURGEON: Laurene Footman, MD  ASSISTANTS: None  ANESTHESIA:   general  EBL:  Total I/O In: 100 [IV Piggyback:100] Out: -   BLOOD ADMINISTERED:none  DRAINS: none   LOCAL MEDICATIONS USED:  MARCAINE     SPECIMEN:  No Specimen  DISPOSITION OF SPECIMEN:  N/A  COUNTS:  YES None TOURNIQUET:   Total Tourniquet Time Documented: Upper Arm (Left) - 6 minutes Total: Upper Arm (Left) - 6 minutes   IMPLANTS: None  DICTATION: .Dragon Dictation patient was brought to the operating room and after adequate anesthesia was obtained the left arm was prepped and draped in usual sterile fashion.  After patient identification and timeout procedures were completed, tourniquet was raised to 250 mmHg.  The finger had been cut off on the ulnar side was a great deal of the pulp involved.  The residual nail was then elliptically excised with a fishmouth type incision on the radial side with the bone exposed the soft tissue raised remaining off the radial side was elevated off the phalanx and the most of the distal phalanx was removed.  At this point the skin edges were debrided and fashioned to allow for primary closure.  At this was after this was done then the wound therapy thoroughly irrigated the wound was closed with simple 3-0 nylon skin sutures.  At the start of the case after the timeout procedure 20 cc of half percent Sensorcaine were infiltrated as a digital block for postop analgesia.  Xeroform fluffs Kling and 2 inch Ace wrap were then applied after wound closure and patient sent to recovery in stable condition  PLAN OF CARE: Discharge to home after PACU  PATIENT DISPOSITION:  PACU - hemodynamically stable.

## 2019-09-26 NOTE — Discharge Instructions (Addendum)
Leave dressing dry and clean. Pain medicine as directed  Call if having problems    AMBULATORY SURGERY  DISCHARGE INSTRUCTIONS   1) The drugs that you were given will stay in your system until tomorrow so for the next 24 hours you should not:  A) Drive an automobile B) Make any legal decisions C) Drink any alcoholic beverage   2) You may resume regular meals tomorrow.  Today it is better to start with liquids and gradually work up to solid foods.  You may eat anything you prefer, but it is better to start with liquids, then soup and crackers, and gradually work up to solid foods.   3) Please notify your doctor immediately if you have any unusual bleeding, trouble breathing, redness and pain at the surgery site, drainage, fever, or pain not relieved by medication.    4) Additional Instructions:        Please contact your physician with any problems or Same Day Surgery at [phone], Monday through Friday 6 am to 4 pm, or [place] at [facility] number at [phone].

## 2019-09-26 NOTE — Transfer of Care (Addendum)
Immediate Anesthesia Transfer of Care Note  Patient: Marco Sims  Procedure(s) Performed: LEFT LITTLE FINGER TIP, AMPUTATION (Left Little Finger)  Patient Location: PACU  Anesthesia Type:General  Level of Consciousness: sedated  Airway & Oxygen Therapy: Patient Spontanous Breathing and Patient connected to face mask oxygen  Post-op Assessment: Report given to RN and Post -op Vital signs reviewed and stable  Post vital signs: Reviewed and stable  Last Vitals:  Vitals Value Taken Time  BP 130/51 09/26/19 1252  Temp    Pulse 58 09/26/19 1258  Resp 11 09/26/19 1258  SpO2 99 % 09/26/19 1258  Vitals shown include unvalidated device data.  Last Pain:  Vitals:   09/26/19 1252  TempSrc:   PainSc: (P) Asleep      Patients Stated Pain Goal: 0 ([id number])  Complications: No apparent anesthesia complications

## 2019-09-26 NOTE — Anesthesia Preprocedure Evaluation (Signed)
Anesthesia Evaluation  Patient identified by MRN, date of birth, ID band Patient awake    Reviewed: Allergy & Precautions, NPO status , Patient's Chart, lab work & pertinent test results  History of Anesthesia Complications Negative for: history of anesthetic complications  Airway Mallampati: II  TM Distance: >3 FB Neck ROM: Full    Dental no notable dental hx.    Pulmonary neg pulmonary ROS, neg sleep apnea, neg COPD,    breath sounds clear to auscultation- rhonchi (-) wheezing      Cardiovascular hypertension, Pt. on medications (-) CAD, (-) Past MI, (-) Cardiac Stents and (-) CABG  Rhythm:Regular Rate:Normal - Systolic murmurs and - Diastolic murmurs    Neuro/Psych neg Seizures negative neurological ROS  negative psych ROS   GI/Hepatic Neg liver ROS, GERD  ,  Endo/Other  negative endocrine ROSneg diabetes  Renal/GU negative Renal ROS     Musculoskeletal negative musculoskeletal ROS (+)   Abdominal (+) - obese,   Peds  Hematology  (+) anemia ,   Anesthesia Other Findings Past Medical History: No date: Allergy     Comment:  seaasonal No date: Anemia No date: Colon polyp No date: GERD (gastroesophageal reflux disease) No date: Hypertension No date: Lichen simplex chronicus No date: Wears hearing aid in both ears   Reproductive/Obstetrics                             Anesthesia Physical Anesthesia Plan  ASA: II  Anesthesia Plan: General   Post-op Pain Management:    Induction: Intravenous  PONV Risk Score and Plan: 1 and Ondansetron and Dexamethasone  Airway Management Planned: LMA  Additional Equipment:   Intra-op Plan:   Post-operative Plan:   Informed Consent: I have reviewed the patients History and Physical, chart, labs and discussed the procedure including the risks, benefits and alternatives for the proposed anesthesia with the patient or authorized  representative who has indicated his/her understanding and acceptance.     Dental advisory given  Plan Discussed with: CRNA and Anesthesiologist  Anesthesia Plan Comments:         Anesthesia Quick Evaluation

## 2019-10-08 DIAGNOSIS — Z9181 History of falling: Secondary | ICD-10-CM | POA: Diagnosis not present

## 2019-10-08 DIAGNOSIS — I10 Essential (primary) hypertension: Secondary | ICD-10-CM | POA: Diagnosis not present

## 2019-10-08 DIAGNOSIS — K219 Gastro-esophageal reflux disease without esophagitis: Secondary | ICD-10-CM | POA: Diagnosis not present

## 2020-02-16 DIAGNOSIS — K219 Gastro-esophageal reflux disease without esophagitis: Secondary | ICD-10-CM | POA: Diagnosis not present

## 2020-02-16 DIAGNOSIS — I10 Essential (primary) hypertension: Secondary | ICD-10-CM | POA: Diagnosis not present

## 2020-02-16 DIAGNOSIS — E782 Mixed hyperlipidemia: Secondary | ICD-10-CM | POA: Diagnosis not present

## 2020-02-16 DIAGNOSIS — D649 Anemia, unspecified: Secondary | ICD-10-CM | POA: Diagnosis not present

## 2020-03-09 ENCOUNTER — Encounter: Payer: Self-pay | Admitting: Family Medicine

## 2020-03-09 ENCOUNTER — Ambulatory Visit (INDEPENDENT_AMBULATORY_CARE_PROVIDER_SITE_OTHER): Payer: Medicare HMO | Admitting: Family Medicine

## 2020-03-09 ENCOUNTER — Other Ambulatory Visit: Payer: Self-pay

## 2020-03-09 VITALS — BP 130/68 | HR 62 | Ht 69.0 in | Wt 177.0 lb

## 2020-03-09 DIAGNOSIS — I10 Essential (primary) hypertension: Secondary | ICD-10-CM

## 2020-03-09 DIAGNOSIS — K219 Gastro-esophageal reflux disease without esophagitis: Secondary | ICD-10-CM | POA: Diagnosis not present

## 2020-03-09 MED ORDER — AMLODIPINE BESYLATE 10 MG PO TABS: 10.0000 mg | ORAL_TABLET | Freq: Every day | ORAL | 1 refills | Status: DC

## 2020-03-09 MED ORDER — OMEPRAZOLE 20 MG PO CPDR: 20.0000 mg | DELAYED_RELEASE_CAPSULE | Freq: Every day | ORAL | 1 refills | Status: DC

## 2020-03-09 NOTE — Progress Notes (Signed)
Date:  03/09/2020   Name:  Marco Sims   DOB:  1934-04-18   MRN:  [id number]   Chief Complaint: Hypertension (Amlodipine RF.) and Gastroesophageal Reflux (Omeprazole RF.)  Hypertension This is a chronic problem. The current episode started more than 1 year ago. The problem has been gradually improving since onset. The problem is controlled. Pertinent negatives include no anxiety, blurred vision, chest pain, headaches, malaise/fatigue, neck pain, orthopnea, palpitations, peripheral edema, PND, shortness of breath or sweats. There are no associated agents to hypertension. There are no known risk factors for coronary artery disease. Past treatments include calcium channel blockers. The current treatment provides moderate improvement. There are no compliance problems.  There is no history of angina, kidney disease, CAD/MI, CVA, heart failure, left ventricular hypertrophy, PVD or retinopathy. There is no history of chronic renal disease, a hypertension causing med or renovascular disease.  Gastroesophageal Reflux He reports no abdominal pain, no belching, no chest pain, no choking, no coughing, no dysphagia, no early satiety, no globus sensation, no heartburn, no hoarse voice, no nausea, no sore throat, no stridor, no tooth decay, no water brash or no wheezing. This is a chronic problem. The current episode started more than 1 year ago. The symptoms are aggravated by certain foods. Pertinent negatives include no anemia, fatigue, melena, muscle weakness, orthopnea or weight loss. There are no known risk factors. He has tried a PPI for the symptoms. The treatment provided moderate relief.    Lab Results  Component Value Date   CREATININE 0.86 09/16/2019   BUN 18 09/16/2019   NA 144 09/16/2019   K 4.4 09/16/2019   CL 108 (H) 09/16/2019   CO2 24 09/16/2019   Lab Results  Component Value Date   CHOL 242 (H) 09/16/2019   HDL 47 09/16/2019   LDLCALC 167 (H) 09/16/2019   LDLDIRECT 168.9 01/10/2011    TRIG 156 (H) 09/16/2019   CHOLHDL 5.6 (H) 01/09/2018   Lab Results  Component Value Date   TSH 2.62 01/10/2011   No results found for: HGBA1C Lab Results  Component Value Date   WBC 5.0 09/26/2019   HGB 12.1 (L) 09/26/2019   HCT 37.7 (L) 09/26/2019   MCV 79.7 (L) 09/26/2019   PLT 228 09/26/2019   Lab Results  Component Value Date   ALT 27 06/14/2018   AST 27 06/14/2018   ALKPHOS 87 06/14/2018   BILITOT 1.1 06/14/2018     Review of Systems  Constitutional: Negative for chills, fatigue, fever, malaise/fatigue and weight loss.  HENT: Negative for drooling, ear discharge, ear pain, hoarse voice and sore throat.   Eyes: Negative for blurred vision.  Respiratory: Negative for cough, choking, shortness of breath and wheezing.   Cardiovascular: Negative for chest pain, palpitations, orthopnea, leg swelling and PND.  Gastrointestinal: Negative for abdominal pain, blood in stool, constipation, diarrhea, dysphagia, heartburn, melena and nausea.  Endocrine: Negative for polydipsia.  Genitourinary: Negative for dysuria, frequency, hematuria and urgency.  Musculoskeletal: Negative for back pain, myalgias, muscle weakness and neck pain.  Skin: Negative for rash.  Allergic/Immunologic: Negative for environmental allergies.  Neurological: Negative for dizziness and headaches.  Hematological: Does not bruise/bleed easily.  Psychiatric/Behavioral: Negative for suicidal ideas. The patient is not nervous/anxious.     Patient Active Problem List   Diagnosis Date Noted  . Hematochezia   . Benign neoplasm of ascending colon   . Essential hypertension 01/09/2018  . GERD 11/04/2009  . ERECTILE DYSFUNCTION 07/28/2008  . DERMATITIS 07/28/2008  .  LABYRINTHITIS 11/05/2007  . HYPERLIPIDEMIA 09/24/2007  . ANEMIA 09/24/2007  . CONDUCTIVE HEARING LOSS UNILATERAL 09/24/2007  . ARTHRITIS 09/24/2007  . UNS ADVRS EFF OTH RX MEDICINAL&BIOLOGICAL SBSTNC 09/24/2007    No Known Allergies  Past  Surgical History:  Procedure Laterality Date  . AMPUTATION Left 09/26/2019   Procedure: LEFT LITTLE FINGER TIP, AMPUTATION;  Surgeon: Hessie Knows, MD;  Location: [facility];  Service: Orthopedics;  Laterality: Left;  . Colles' fracture right 1956    . COLONOSCOPY  2013  . COLONOSCOPY WITH PROPOFOL N/A 07/11/2018   Procedure: COLONOSCOPY WITH PROPOFOL;  Surgeon: Lucilla Lame, MD;  Location: [place];  Service: Endoscopy;  Laterality: N/A;  . FRACTURE SURGERY    . fractured left arm radius and  ulna 1955    . fractured right tibiofibular 2009    . HAND SURGERY  06/02/11  . HERNIA REPAIR Right 2010, 2015  . HERNIA REPAIR Left 2015  . herniorpahy, rt  05/09/2009  . NERVE REPAIR Right 08/12/2015   Procedure: EXPLORATION REPAIR  NERVE, DEBRIDEMENT FRACTURE RIGHT INDEX MIDDLE FINGER;  Surgeon: Daryll Brod, MD;  Location: [place];  Service: Orthopedics;  Laterality: Right;  . POLYPECTOMY  07/11/2018   Procedure: POLYPECTOMY;  Surgeon: Lucilla Lame, MD;  Location: [place];  Service: Endoscopy;;  . ruptured left Achilles tendon 1980    . surgery for cubital tunnel syndrome and left arm 1999    . tonsillectomy 1942      Social History   Tobacco Use  . Smoking status: Never Smoker  . Smokeless tobacco: Never Used  . Tobacco comment: smoking cessation materials not required  Vaping Use  . Vaping Use: Never used  Substance Use Topics  . Alcohol use: Yes    Alcohol/week: 0.0 standard drinks    Comment: rare; 1 to 2 beers a month  . Drug use: No     Medication list has been reviewed and updated.  Current Meds  Medication Sig  . amLODipine (NORVASC) 10 MG tablet Take 1 tablet (10 mg total) by mouth daily.  . Ferrous Sulfate (IRON PO) Take 1 tablet by mouth 2 (two) times a week.  Marland Kitchen ibuprofen (ADVIL) 200 MG tablet Take 200-400 mg by mouth at bedtime as needed for moderate pain.  Marland Kitchen omeprazole (PRILOSEC) 20 MG capsule Take 1 capsule (20 mg total) by mouth  daily.  . rosuvastatin (CRESTOR) 10 MG tablet Take 10 mg by mouth daily. Cardiology Dr    [facility] 2/9 Scores 03/09/2020 09/16/2019 09/15/2019 09/11/2018  PHQ - 2 Score 0 0 0 0  PHQ- 9 Score 0 0 - -    GAD 7 : Generalized Anxiety Score 03/09/2020 09/16/2019  Nervous, Anxious, on Edge 0 0  Control/stop worrying 0 0  Worry too much - different things 0 0  Trouble relaxing 0 0  Restless 0 0  Easily annoyed or irritable 0 0  Afraid - awful might happen 0 0  Total GAD 7 Score 0 0  Anxiety Difficulty Not difficult at all -    BP Readings from Last 3 Encounters:  03/09/20 130/68  09/26/19 (!) 154/59  09/16/19 132/68    Physical Exam Vitals and nursing note reviewed.  HENT:     Head: Normocephalic.     Right Ear: Tympanic membrane, ear canal and external ear normal.     Left Ear: Tympanic membrane, ear canal and external ear normal.     Nose: Nose normal.  Eyes:  General: No scleral icterus.       Right eye: No discharge.        Left eye: No discharge.     Conjunctiva/sclera: Conjunctivae normal.     Pupils: Pupils are equal, round, and reactive to light.  Neck:     Thyroid: No thyromegaly.     Vascular: No JVD.     Trachea: No tracheal deviation.  Cardiovascular:     Rate and Rhythm: Normal rate and regular rhythm.     Heart sounds: Normal heart sounds. No murmur heard.  No friction rub. No gallop.   Pulmonary:     Effort: No respiratory distress.     Breath sounds: Normal breath sounds. No stridor. No wheezing, rhonchi or rales.  Chest:     Chest wall: No tenderness.  Abdominal:     General: Bowel sounds are normal.     Palpations: Abdomen is soft. There is no mass.     Tenderness: There is no abdominal tenderness. There is no guarding or rebound.  Musculoskeletal:        General: No tenderness. Normal range of motion.     Cervical back: Normal range of motion and neck supple.  Lymphadenopathy:     Cervical: No cervical adenopathy.  Skin:    General: Skin is warm.      Findings: No rash.  Neurological:     Mental Status: He is alert and oriented to person, place, and time.     Cranial Nerves: No cranial nerve deficit.     Deep Tendon Reflexes: Reflexes are normal and symmetric.     Wt Readings from Last 3 Encounters:  03/09/20 177 lb (80.3 kg)  09/26/19 182 lb (82.6 kg)  09/16/19 183 lb (83 kg)    BP 130/68   Pulse 62   Ht 5\' 9"  (1.753 m)   Wt 177 lb (80.3 kg)   SpO2 96%   BMI 26.14 kg/m   Assessment and Plan: 1. Essential hypertension Chronic.  Controlled.  Patient has been out of medication for 3 weeks.  However for cardiovascular reasons that I explained that his cardiologist expects him to be on a calcium channel blocker this needs to be continued.  Will refill amlodipine 10 mg once a day. - amLODipine (NORVASC) 10 MG tablet; Take 1 tablet (10 mg total) by mouth daily.  Dispense: 90 tablet; Refill: 1  2. Gastroesophageal reflux disease Chronic.  Controlled.  Stable.  Continue Prilosec 20 mg once a day.  Will recheck in 6 months. - omeprazole (PRILOSEC) 20 MG capsule; Take 1 capsule (20 mg total) by mouth daily.  Dispense: 90 capsule; Refill: 1

## 2020-04-05 IMAGING — CT CT CHEST W/O CM
1 series · 15 of 34 positions shown, 19 images · non-contrast
Comparison: Abdominal CT 06/14/2018, plain film 06/14/2018

CLINICAL DATA: 84-year-old male with a history of pulmonary nodules

EXAM:
CT CHEST WITHOUT CONTRAST
TECHNIQUE: Multidetector CT imaging of the chest was performed following the
standard protocol without IV contrast.

[Series 2: thorax · axial · 0.71mm/px · z∈[-343,-53]mm · 15 of 171 slices shown, 19 images]
[im 13/171  mediastinal]
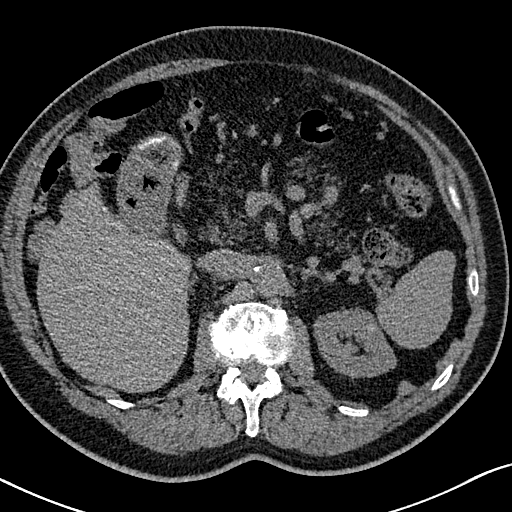
[im 13/171  lung]
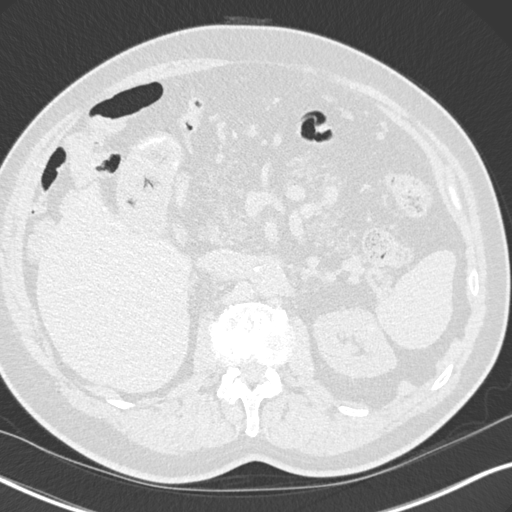
[im 26/171  lung]
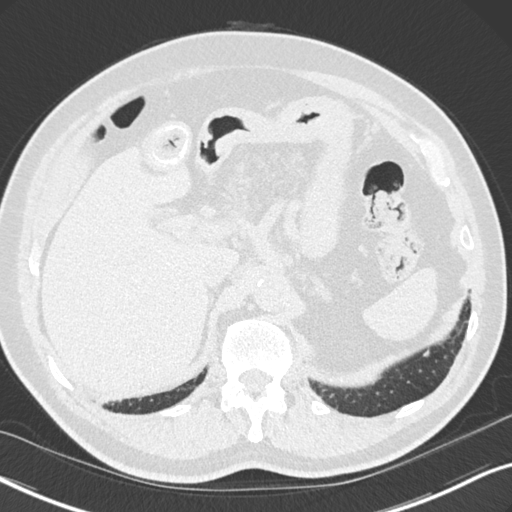
[im 35/171  lung]
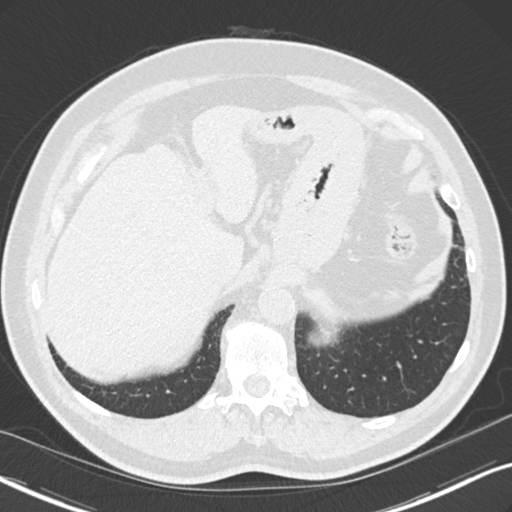
[im 45/171  lung]
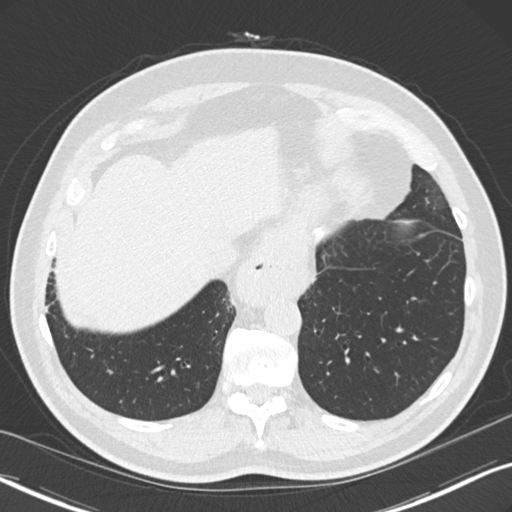
[im 57/171  mediastinal]
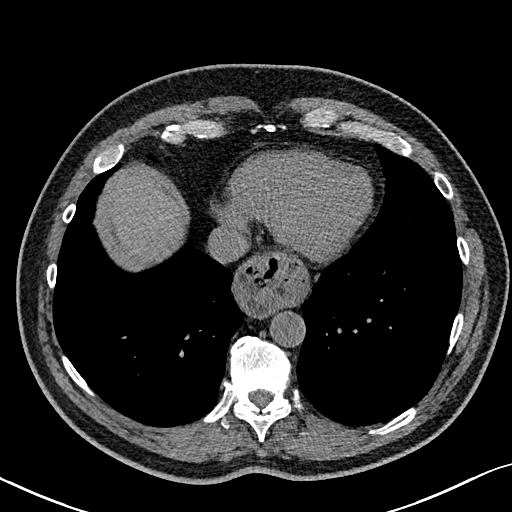
[im 57/171  lung]
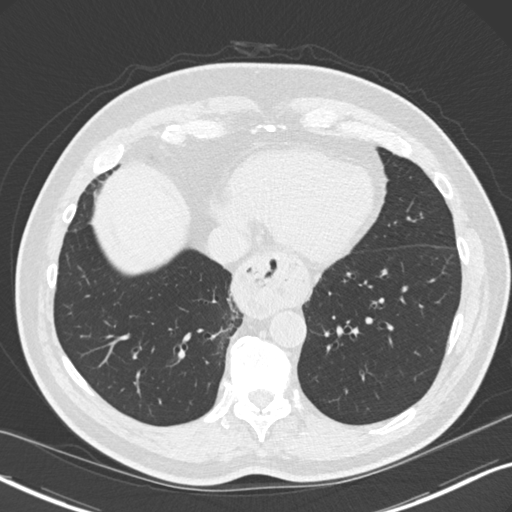
[im 69/171  lung]
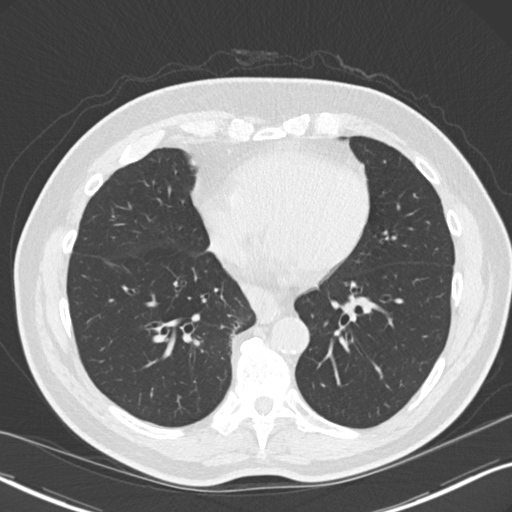
[im 76/171  lung]
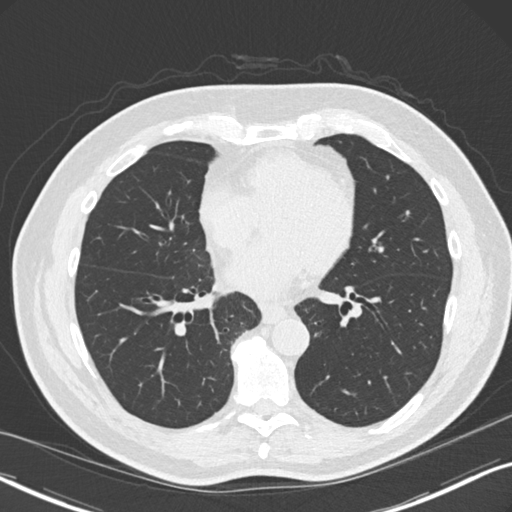
[im 89/171  lung]
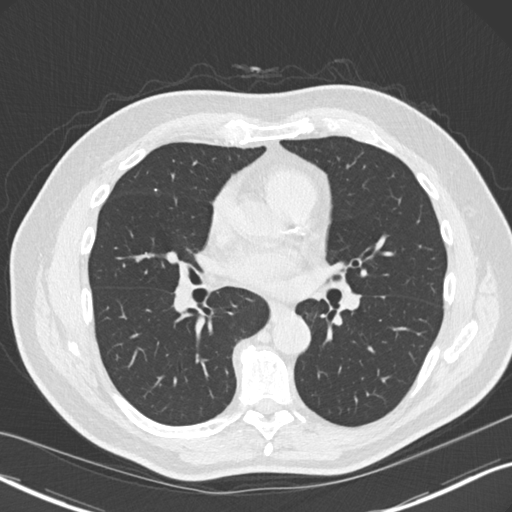
[im 95/171  mediastinal]
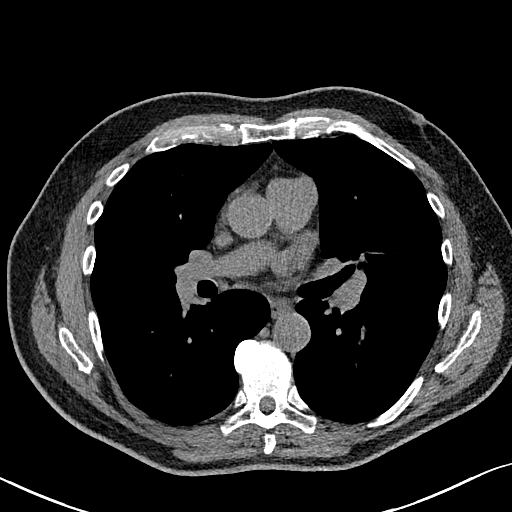
[im 95/171  lung]
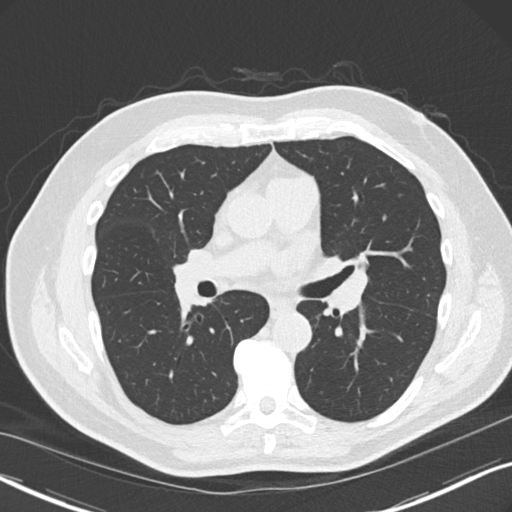
[im 103/171  lung]
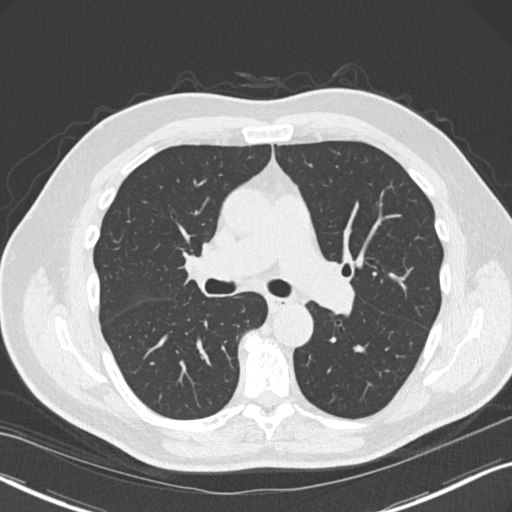
[im 114/171  lung]
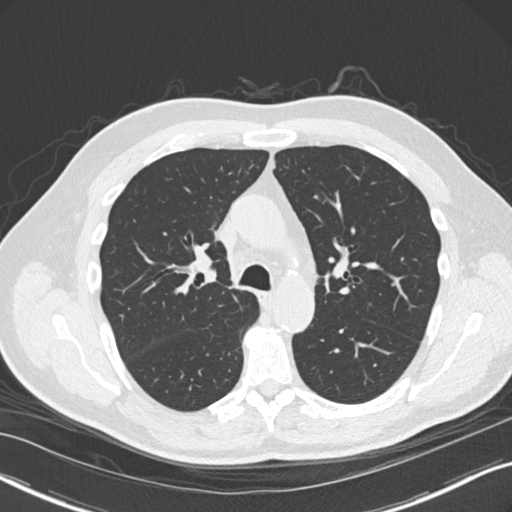
[im 126/171  lung]
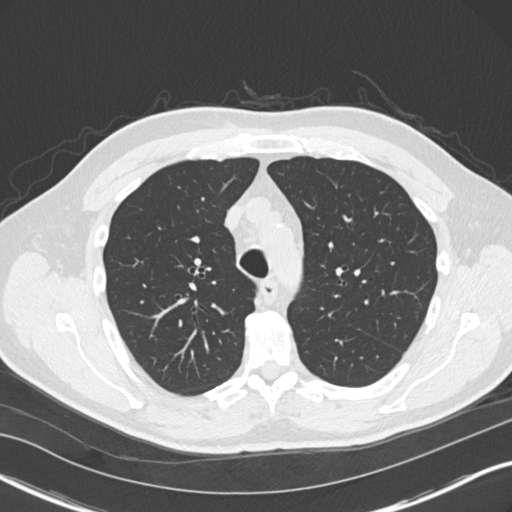
[im 137/171  mediastinal]
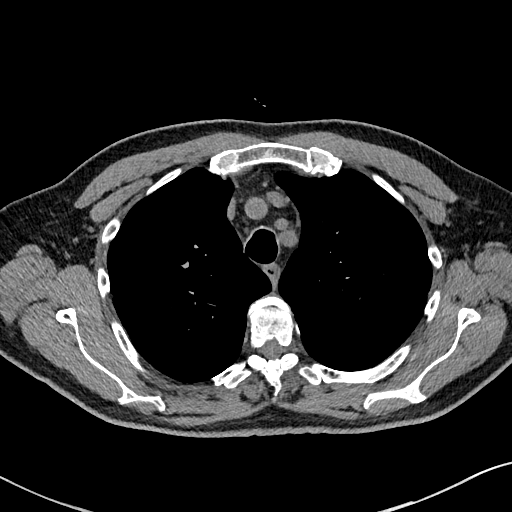
[im 137/171  lung]
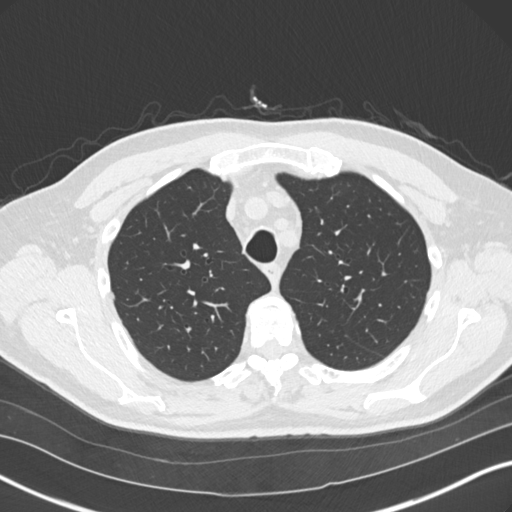
[im 145/171  lung]
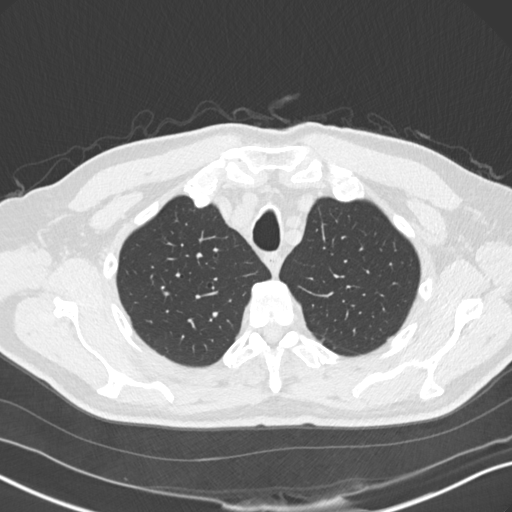
[im 158/171  lung]
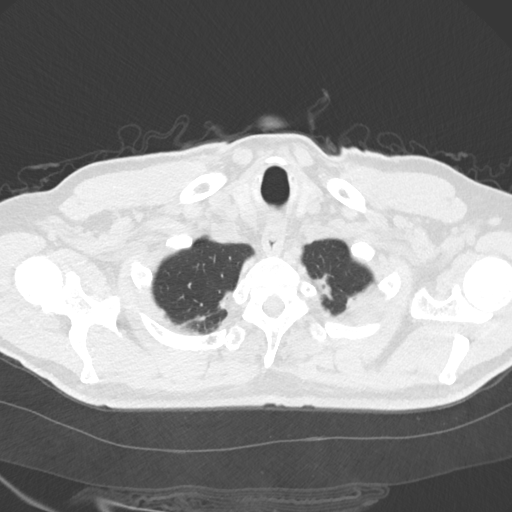

[15 of 34 positions shown; findings below may reference images not displayed]

FINDINGS: Cardiovascular: Heart size within normal limits. Calcifications of
left main, left anterior descending, circumflex, right coronary
arteries. No pericardial fluid/thickening.

Normal course caliber contour of the thoracic aorta. Calcifications
of the aortic arch.

Unremarkable diameter of the main pulmonary artery.

Mediastinum/Nodes: Small lymph nodes of the mediastinum, not
enlarged. Unremarkable thoracic inlet. Unremarkable thoracic
esophagus. Hiatal hernia.

Lungs/Pleura: No confluent airspace disease. No pneumothorax or
pleural effusion. No endotracheal or endobronchial debris. Minimal
bronchiectasis. Mild scarring at the apices. Scarring/atelectasis of
the right middle lobe.

5 mm nodule of the lingula on image 113 of series 3.

8 mm nodule of the left lower lobe, unchanged, image 93 of series 3.

7 mm nodule of the right lower lobe, unchanged from prior, image 120
of series 3.

Upper Abdomen: No acute finding of the upper abdomen.
Cholelithiasis.

Musculoskeletal: Redemonstration of benign right renal cyst.
IMPRESSION: No acute finding.

Redemonstration of small nodules of the lingula, left lower lobe,
right lower lobe. Non-contrast chest CT at 6-12 months is
recommended. If the nodule is stable at time of repeat CT, then
future CT at 18-24 months (from today's scan) is considered optional
for low-risk patients, but is recommended for high-risk patients.
This recommendation follows the consensus statement: Guidelines for
Management of Incidental Pulmonary Nodules Detected on CT Images:

Aortic atherosclerosis with associated left main and 3 vessel
coronary artery disease. Aortic Atherosclerosis ([id number].E).

Hiatal hernia.

## 2020-07-07 ENCOUNTER — Ambulatory Visit: Payer: Medicare HMO | Admitting: Dermatology

## 2020-07-07 ENCOUNTER — Other Ambulatory Visit: Payer: Self-pay

## 2020-07-07 DIAGNOSIS — L280 Lichen simplex chronicus: Secondary | ICD-10-CM

## 2020-07-07 DIAGNOSIS — L011 Impetiginization of other dermatoses: Secondary | ICD-10-CM | POA: Diagnosis not present

## 2020-07-07 DIAGNOSIS — L2081 Atopic neurodermatitis: Secondary | ICD-10-CM

## 2020-07-07 DIAGNOSIS — L0100 Impetigo, unspecified: Secondary | ICD-10-CM

## 2020-07-07 MED ORDER — MUPIROCIN 2 % EX OINT: TOPICAL_OINTMENT | CUTANEOUS | 3 refills | Status: AC

## 2020-07-07 MED ORDER — TRIAMCINOLONE ACETONIDE 0.1 % EX OINT: TOPICAL_OINTMENT | CUTANEOUS | 3 refills | Status: AC

## 2020-07-07 NOTE — Patient Instructions (Signed)
At night apply Triamcinolone 0.1% ointment to the left lower leg. Then wrap in saran and leave on over night. In the morning take off the saran wrap and wash off the Triamcinolone ointment. Then apply Mupirocin 2% ointment to crusts on the left lower leg. Next, apply CeraVe cream moisturizer to the legs. Do this every day until your follow up appointment.

## 2020-07-07 NOTE — Progress Notes (Signed)
   New Patient Visit  Subjective  Marco Sims is a 86 y.o. 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 He would like to discuss treatment options). Patient does pick and scratch at crusts.  The following portions of the chart were reviewed this encounter and updated as appropriate:  Tobacco  Allergies  Meds  Problems  Med Hx  Surg Hx  Fam Hx     Review of Systems:  No other skin or systemic complaints except as noted in HPI or Assessment and Plan.  Objective  Well appearing patient in no apparent distress; mood and affect are within normal limits.  A focused examination was performed including the left leg. Relevant physical exam findings are noted in the Assessment and Plan.  Objective  L lower leg: Papules and plaques with crusts and erosions, erythema and scale, and dyschromia of the L lower leg. Minimal involvement of the right.   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 Wash off in the morning then apply CeraVe cream moisturizer.   Consider Opzelura and/or Eucrisa ointment if not improved at follow up appointment. May also consider Dupixent.  Atopic dermatitis (eczema) is a chronic, relapsing, pruritic condition that can significantly affect quality of life. It is often associated with allergic rhinitis and/or asthma and can require treatment with topical medications, phototherapy, or in severe cases a biologic medication called Dupixent.   triamcinolone ointment (KENALOG) 0.1 % - L lower leg  Impetigo Left Lower Leg - Anterior Secondary impetiginization -  Start Mupirocin 2% ointment QAM.   mupirocin ointment (BACTROBAN) 2 % - Left Lower Leg - Anterior  Return in about 6 weeks  (around 08/18/2020).  Luther Redo, CMA, am acting as scribe for Sarina Ser, MD .  Documentation: I have reviewed the above documentation for accuracy and completeness, and I agree with the above.  Sarina Ser, MD

## 2020-07-13 ENCOUNTER — Encounter: Payer: Self-pay | Admitting: Dermatology

## 2020-07-23 ENCOUNTER — Ambulatory Visit
Admission: RE | Admit: 2020-07-23 | Discharge: 2020-07-23 | Disposition: A | Discharge status: Home / Self Care | Payer: Medicare HMO | Source: Ambulatory Visit | Attending: Oncology | Admitting: Oncology

## 2020-07-23 ENCOUNTER — Other Ambulatory Visit: Payer: Self-pay

## 2020-07-23 DIAGNOSIS — R911 Solitary pulmonary nodule: Secondary | ICD-10-CM | POA: Insufficient documentation

## 2020-07-23 DIAGNOSIS — K8080 Other cholelithiasis without obstruction: Secondary | ICD-10-CM | POA: Diagnosis not present

## 2020-07-23 DIAGNOSIS — M47814 Spondylosis without myelopathy or radiculopathy, thoracic region: Secondary | ICD-10-CM | POA: Diagnosis not present

## 2020-07-23 DIAGNOSIS — K449 Diaphragmatic hernia without obstruction or gangrene: Secondary | ICD-10-CM | POA: Diagnosis not present

## 2020-07-23 DIAGNOSIS — I2510 Atherosclerotic heart disease of native coronary artery without angina pectoris: Secondary | ICD-10-CM | POA: Diagnosis not present

## 2020-07-26 ENCOUNTER — Other Ambulatory Visit: Payer: Self-pay

## 2020-07-26 ENCOUNTER — Inpatient Hospital Stay: Payer: Medicare HMO | Attending: Oncology | Admitting: Oncology

## 2020-07-26 DIAGNOSIS — R911 Solitary pulmonary nodule: Secondary | ICD-10-CM

## 2020-07-26 NOTE — Progress Notes (Signed)
Pulmonary Nodule Clinic Consult note [facility]  Telephone:([phone] [phone]  Patient Care Team: [name], MD as PCP - General (Family Medicine) Corey Skains, MD as Consulting Physician (Cardiology)   Name of the patient: Marco Sims  [id number]  07/12/1937   Date of visit: 07/27/2023   Diagnosis- Incidental Lung Nodule   Chief complaint/ Reason for visit- Follow-up visit to pulmonary nodule clinic  PMH history: Patient referred by PCP Dr. [name] to our clinic for recommendations and follow-up of incidental lung nodule findings discovered on 06/14/2018. Imaging reveaed multiple bilateral lung base nodules with largest in left lower lobe measuring 9 mm not clearly seen on comparison CT.  Comparison imaging from 06/07/2012 revealed multiple nodular densities in the lung bases that were nonspecific but suggestive of possible areas of granulomatosis disease or other benign etiology. No further imaging available.   He was evaluated in the pulmonary nodule clinic on 09/24/2018 to review follow-up scans. Ct scan revealed nodules of the left and lower lower lobes (5 mm nodule of the lingula, 8 mm nodule of the LLL, 7 mm nodule of the right LL).  They appeared unchanged and/or slightly smaller in size from previous.  It was recommended he return to clinic in approximately 6 to 12 months for repeat imaging.  Repeat imaging completed on 07/23/2019 showed numerous pulmonary nodules.  2 of the nodules seen on previous study were stable.  Numerous other stable nodules are identified and described.  There is a nodule in the medial right upper lobe which measures slightly larger in 1 dimension possibly due to motion.  Recommended follow-up in 1 year.  Interval history- Patient is a non-smoker; has never smoked.  Denies any occupational exposure to asbestos, radium or uranium.  Denies any family history of lung cancer.  Denies any known history of lung  disease. Denies previous personal or family history of malignancy.   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    Occupation: Working with [employer].  Exposure to sawdust.  Clydene Laming dust is considered a group 1 carcinogen and responsible for up to 27% of lung cancers in men.  I explained to Mr. Kilgore that lung cancer is multifactorial and sawdust alone is less likely  to cause lung cancer.  We discussed other occupational substances such as diesel fumes, natural fibers such as asbestos, silica and wood dust, metals including aluminum, arsenic and beryllium, radon, reactive chemicals, secondhand smoke and solvents like benzene and Toluene.  Most common occupations associated with the development of lung cancer include painters, Dispensing optician work, metal work, Tourist information centre manager, Conservation officer, historic buildings, truck driving, Chemical engineer, Press photographer, aluminum Engineer, civil (consulting).  He has past medical history of essential hypertension, GERD, colon polyp, conductive hearing loss, arthritis, hyperlipidemia, anemia and dementia.    In the interim, he continues to do well.  He denies any recent fevers or illness.  His appetite is good.  He is retired and works with wood at home.  He denies any weight loss and maintains a good appetite.  He denies chest pain, nausea, vomiting, constipation or diarrhea. Has history of BPH with intermittent urinary retention. On flomax.  Denies any respiratory concerns including shortness of breath, cough, wheezing or hemoptysis.  ECOG FS:1 - Symptomatic but completely ambulatory  Review of systems- Review of Systems  Constitutional: Positive for malaise/fatigue. Negative for chills, fever and weight loss.  HENT: Negative for congestion, ear pain and tinnitus.   Eyes: Negative.  Negative  for blurred vision and double vision.  Respiratory: Negative.  Negative for cough, sputum production and shortness of breath.   Cardiovascular: Positive for chest pain (Intermittent and changes with positioning).  Negative for palpitations and leg swelling.  Gastrointestinal: Negative.  Negative for abdominal pain, constipation, diarrhea, nausea and vomiting.  Genitourinary: Positive for frequency. Negative for dysuria and urgency.  Musculoskeletal: Negative for back pain and falls.  Skin: Negative.  Negative for rash.  Neurological: Negative.  Negative for weakness and headaches.  Endo/Heme/Allergies: Negative.  Does not bruise/bleed easily.  Psychiatric/Behavioral: Negative.  Negative for depression. The patient is not nervous/anxious and does not have insomnia.     No Known Allergies   Past Medical History:  Diagnosis Date  . Allergy    seaasonal  . Anemia   . Colon polyp   . GERD (gastroesophageal reflux disease)   . Hypertension   . Lichen simplex chronicus   . Wears hearing aid in both ears      Past Surgical History:  Procedure Laterality Date  . AMPUTATION Left 09/26/2019   Procedure: LEFT LITTLE FINGER TIP, AMPUTATION;  Surgeon: Hessie Knows, MD;  Location: [facility];  Service: Orthopedics;  Laterality: Left;  . Colles' fracture right 1956    . COLONOSCOPY  2013  . COLONOSCOPY WITH PROPOFOL N/A 07/11/2018   Procedure: COLONOSCOPY WITH PROPOFOL;  Surgeon: Lucilla Lame, MD;  Location: [facility];  Service: Endoscopy;  Laterality: N/A;  . FRACTURE SURGERY    . fractured left arm radius and  ulna 1955    . fractured right tibiofibular 2009    . HAND SURGERY  06/02/11  . HERNIA REPAIR Right 2010, 2015  . HERNIA REPAIR Left 2015  . herniorpahy, rt  05/09/2009  . NERVE REPAIR Right 08/12/2015   Procedure: EXPLORATION REPAIR  NERVE, DEBRIDEMENT FRACTURE RIGHT INDEX MIDDLE FINGER;  Surgeon: Daryll Brod, MD;  Location: [facility];  Service: Orthopedics;  Laterality: Right;  . POLYPECTOMY  07/11/2018   Procedure: POLYPECTOMY;  Surgeon: Lucilla Lame, MD;  Location: [facility];  Service: Endoscopy;;  . ruptured left Achilles tendon 1980    . surgery for  cubital tunnel syndrome and left arm 1999    . tonsillectomy 1942      Social History   Socioeconomic History  . Marital status: Soil scientist    Spouse name: Not on file  . Number of children: 3  . Years of education: Not on file  . Highest education level: Bachelor's degree (e.g., BA, AB, BS)  Occupational History  . Occupation: Retired  Tobacco Use  . Smoking status: Never Smoker  . Smokeless tobacco: Never Used  . Tobacco comment: smoking cessation materials not required  Vaping Use  . Vaping Use: Never used  Substance and Sexual Activity  . Alcohol use: Yes    Alcohol/week: 0.0 standard drinks    Comment: rare; 1 to 2 beers a month  . Drug use: No  . Sexual activity: Not Currently  Other Topics Concern  . Not on file  Social History Narrative  . Not on file   Social Determinants of Health   Financial Resource Strain: Low Risk   . Difficulty of Paying Living Expenses: Not hard at all  Food Insecurity: No Food Insecurity  . Worried About Charity fundraiser in the Last Year: Never true  . Ran Out of Food in the Last Year: Never true  Transportation Needs: No Transportation Needs  . Lack of Transportation (Medical):  No  . Lack of Transportation (Non-Medical): No  Physical Activity: Inactive  . Days of Exercise per Week: 0 days  . Minutes of Exercise per Session: 0 min  Stress: No Stress Concern Present  . Feeling of Stress : Not at all  Social Connections: Moderately Integrated  . Frequency of Communication with Friends and Family: More than three times a week  . Frequency of Social Gatherings with Friends and Family: Never  . Attends Religious Services: More than 4 times per year  . Active Member of Clubs or Organizations: No  . Attends Archivist Meetings: Never  . Marital Status: Living with partner  Intimate Partner Violence: Not At Risk  . Fear of Current or Ex-Partner: No  . Emotionally Abused: No  . Physically Abused: No  . Sexually  Abused: No    Family History  Problem Relation Age of Onset  . Parkinson's disease Mother   . Arthritis Father   . Cancer Brother      Current Outpatient Medications:  .  amLODipine (NORVASC) 10 MG tablet, Take 1 tablet (10 mg total) by mouth daily., Disp: 90 tablet, Rfl: 1 .  Ferrous Sulfate (IRON PO), Take 1 tablet by mouth 2 (two) times a week., Disp: , Rfl:  .  ibuprofen (ADVIL) 200 MG tablet, Take 200-400 mg by mouth at bedtime as needed for moderate pain., Disp: , Rfl:  .  mupirocin ointment (BACTROBAN) 2 %, Apply to crusts on the left lower leg QAM., Disp: 22 g, Rfl: 3 .  omeprazole (PRILOSEC) 20 MG capsule, Take 1 capsule (20 mg total) by mouth daily., Disp: 90 capsule, Rfl: 1 .  rosuvastatin (CRESTOR) 10 MG tablet, Take 10 mg by mouth daily. Cardiology Dr, Gerlene Fee: , Rfl:  .  triamcinolone ointment (KENALOG) 0.1 %, Apply to left lower leg QHS as directed., Disp: 80 g, Rfl: 3  Physical exam:  There were no vitals filed for this visit. Physical Exam  -Limited secondary to telephone visit. -Patient able to speak in complete sentences.   CMP Latest Ref Rng & Units 09/16/2019  Glucose 65 - 99 mg/dL 99  BUN 8 - 27 mg/dL 18  Creatinine 0.76 - 1.27 mg/dL 0.86  Sodium 134 - 144 mmol/L 144  Potassium 3.5 - 5.2 mmol/L 4.4  Chloride 96 - 106 mmol/L 108(H)  CO2 20 - 29 mmol/L 24  Calcium 8.6 - 10.2 mg/dL 9.2  Total Protein 6.5 - 8.1 g/dL -  Total Bilirubin 0.3 - 1.2 mg/dL -  Alkaline Phos 38 - 126 U/L -  AST 15 - 41 U/L -  ALT 0 - 44 U/L -   CBC Latest Ref Rng & Units 09/26/2019  WBC 4.0 - 10.5 K/uL 5.0  Hemoglobin 13.0 - 17.0 g/dL 12.1(L)  Hematocrit 39.0 - 52.0 % 37.7(L)  Platelets 150 - 400 K/uL 228    No images are attached to the encounter.  CT Chest Wo Contrast  Result Date: 07/24/2020 CLINICAL DATA:  Follow-up pulmonary nodules. EXAM: CT CHEST WITHOUT CONTRAST TECHNIQUE: Multidetector CT imaging of the chest was performed following the standard protocol without IV  contrast. COMPARISON:  07/23/2019 chest CT. FINDINGS: Cardiovascular: Normal heart size. No significant pericardial effusion/thickening. Left anterior descending coronary atherosclerosis. Atherosclerotic nonaneurysmal thoracic aorta. Normal caliber pulmonary arteries. Mediastinum/Nodes: No discrete thyroid nodules. Unremarkable esophagus. No pathologically enlarged axillary, mediastinal or hilar lymph nodes, noting limited sensitivity for the detection of hilar adenopathy on this noncontrast study. Lungs/Pleura: No pneumothorax. No pleural effusion. No acute  consolidative airspace disease or lung masses. Several (at least 8) solid pulmonary nodules scattered throughout both lungs, largest 8 mm in the posterior left lower lobe (series 3/image 111) and 7 mm in the basilar right lower lobe (series 3/image 146), all stable since 07/23/2019 chest CT, considered benign. No new significant pulmonary nodules. Stable nonspecific patchy subpleural reticulation at both lung bases, probably mild nonspecific postinfectious/postinflammatory scarring. Cholelithiasis. Simple 10.7 cm upper right renal cyst. Upper abdomen: Moderate hiatal hernia. Musculoskeletal: No aggressive appearing focal osseous lesions. Moderate thoracic spondylosis. IMPRESSION: 1. Scattered subcentimeter bilateral solid pulmonary nodules, all stable, considered benign. No active pulmonary disease. 2. One vessel coronary atherosclerosis. 3. Moderate hiatal hernia. 4. Cholelithiasis. 5. Aortic Atherosclerosis (ICD10-I70.0). Electronically Signed   By: Ilona Sorrel M.D.   On: 07/24/2020 16:31   Plan- Patient is a 86 y.o. male who presents for initial visit to pulmonary lung nodule clinic and to receive results from CT scan.   Imaging from 07/24/2020 show scattered subcentimeter bilateral solid pulmonary nodules all are stable and considered benign.  No active pulmonary disease.  Given imaging is stable and he is low risk for development of lung cancer, no  additional follow-up is needed.  Calculating malignancy probability of a pulmonary nodule: Risk factors include: 1.  Age. 2.  Cancer history.   3.  Diameter of the solitary pulmonary nodule in mm 4.  Location 5.  Smoking history 6.  Spiculation present  During our visit, we discussed pulmonary nodules are a common incidental finding and are often how lung cancer is discovered.  Lung cancer survival is directly related to the stage at diagnosis.  We discussed that nodules can vary in presentation from solitary pulmonary nodules to masses, to groundglass opacities and multiple nodules.  Pulmonary nodules in the majority of cases are benign but the probability of these becoming malignant cannot be undermined.  Early identification of malignant nodules could lead to early diagnosis and increased survival.    We discussed the probability of pulmonary nodules becoming malignant increase with age, pack years of tobacco use, size/characteristics of the nodule and location; with upper lobe involvement being most worrisome.   We discussed the goal of our clinic is to thoroughly evaluate each nodule, developed a comprehensive, individualized plan of care utilizing the most advanced technology and significantly reduce the time from detection to treatment.  A dedicated pulmonary nodule clinic has proven to indeed expedite the detection and treatment of lung cancer.   Patient education in fact sheet provided along with most recent CT scans.  Plan: -He continues to do well and denies any shortness of breath or respiratory concerns. -Does have intermittent chest pain that rotates from side to side based on the way he positions himself.  He denies any shortness of breath.  Describes it as sharp only lasting for a few moments.  I have asked that he follow-up with Dr. [name] if this is persistent.  Disposition: No follow-up needed.   Visit Diagnosis 1. Lung nodule     Patient expressed understanding and was  in agreement with this plan. He also understands that He can call clinic at any time with any questions, concerns, or complaints.   Greater than 50% was spent in counseling and coordination of care with this patient including but not limited to discussion of the relevant topics above (See A&P) including, but not limited to diagnosis and management of acute and chronic medical conditions.   Thank you for allowing me to participate in the  care of this very pleasant patient.    Jacquelin Hawking, NP [facility] Cell - [phone] Pager- [phone] 07/27/2023 2:39 PM    CC: Dr. Otilio Miu

## 2020-09-02 ENCOUNTER — Ambulatory Visit: Payer: Medicare HMO | Admitting: Dermatology

## 2020-09-15 ENCOUNTER — Ambulatory Visit (INDEPENDENT_AMBULATORY_CARE_PROVIDER_SITE_OTHER): Payer: Medicare HMO

## 2020-09-15 ENCOUNTER — Ambulatory Visit (INDEPENDENT_AMBULATORY_CARE_PROVIDER_SITE_OTHER): Payer: Medicare HMO | Admitting: Family Medicine

## 2020-09-15 ENCOUNTER — Encounter: Payer: Self-pay | Admitting: Family Medicine

## 2020-09-15 ENCOUNTER — Other Ambulatory Visit: Payer: Self-pay

## 2020-09-15 VITALS — BP 144/78 | HR 67 | Temp 97.6°F | Resp 16 | Ht 69.0 in | Wt 182.0 lb

## 2020-09-15 VITALS — BP 144/78 | HR 67 | Temp 97.6°F | Ht 69.0 in | Wt 182.0 lb

## 2020-09-15 DIAGNOSIS — K219 Gastro-esophageal reflux disease without esophagitis: Secondary | ICD-10-CM | POA: Diagnosis not present

## 2020-09-15 DIAGNOSIS — L603 Nail dystrophy: Secondary | ICD-10-CM | POA: Insufficient documentation

## 2020-09-15 DIAGNOSIS — Z0000 Encounter for general adult medical examination without abnormal findings: Secondary | ICD-10-CM

## 2020-09-15 DIAGNOSIS — E7801 Familial hypercholesterolemia: Secondary | ICD-10-CM | POA: Diagnosis not present

## 2020-09-15 DIAGNOSIS — I10 Essential (primary) hypertension: Secondary | ICD-10-CM | POA: Diagnosis not present

## 2020-09-15 MED ORDER — AMLODIPINE BESYLATE 10 MG PO TABS: 10.0000 mg | ORAL_TABLET | Freq: Every day | ORAL | 0 refills | Status: DC

## 2020-09-15 MED ORDER — LISINOPRIL 5 MG PO TABS: 5.0000 mg | ORAL_TABLET | Freq: Every day | ORAL | 0 refills | Status: DC

## 2020-09-15 MED ORDER — OMEPRAZOLE 20 MG PO CPDR: 20.0000 mg | DELAYED_RELEASE_CAPSULE | Freq: Every day | ORAL | 1 refills | Status: DC

## 2020-09-15 NOTE — Patient Instructions (Signed)

## 2020-09-15 NOTE — Progress Notes (Signed)
Subjective:   Marco Sims is a 87 y.o. male who presents for Medicare Annual/Subsequent preventive examination.  Review of Systems     Cardiac Risk Factors include: advanced age (>65men, >29 women);male gender;hypertension;dyslipidemia     Objective:    Today's Vitals   09/15/20 1010  BP: (!) 144/78  Pulse: 67  Resp: 16  Temp: 97.6 F (36.4 C)  TempSrc: Oral  SpO2: 99%  Weight: 182 lb (82.6 kg)  Height: 5\' 9"  (1.753 m)   Body mass index is 26.88 kg/m.  Advanced Directives 09/15/2020 09/26/2019 09/15/2019 08/30/2019 09/30/2018 09/11/2018 07/11/2018  Does Patient Have a Medical Advance Directive? No No No No Yes Yes No  Type of Advance Directive - - - - - - -  Does patient want to make changes to medical advance directive? - - - - No - Patient declined Yes (MAU/Ambulatory/Procedural Areas - Information given) -  Would patient like information on creating a medical advance directive? Yes (MAU/Ambulatory/Procedural Areas - Information given) No - Patient declined Yes (MAU/Ambulatory/Procedural Areas - Information given) - - - Yes (MAU/Ambulatory/Procedural Areas - Information given)    Current Medications (verified) Outpatient Encounter Medications as of 09/15/2020  Medication Sig  . amLODipine (NORVASC) 10 MG tablet Take 1 tablet (10 mg total) by mouth daily.  . mupirocin ointment (BACTROBAN) 2 % Apply to crusts on the left lower leg QAM.  . omeprazole (PRILOSEC) 20 MG capsule Take 1 capsule (20 mg total) by mouth daily.  Marland Kitchen triamcinolone ointment (KENALOG) 0.1 % Apply to left lower leg QHS as directed.  . rosuvastatin (CRESTOR) 10 MG tablet Take 10 mg by mouth daily. Cardiology Dr (Patient not taking: Reported on 09/15/2020)  . [DISCONTINUED] Ferrous Sulfate (IRON PO) Take 1 tablet by mouth 2 (two) times a week.  . [DISCONTINUED] ibuprofen (ADVIL) 200 MG tablet Take 200-400 mg by mouth at bedtime as needed for moderate pain.   No facility-administered encounter medications on file as  of 09/15/2020.    Allergies (verified) Patient has no known allergies.   History: Past Medical History:  Diagnosis Date  . Allergy    seaasonal  . Anemia   . Colon polyp   . GERD (gastroesophageal reflux disease)   . Hypertension   . Lichen simplex chronicus   . Wears hearing aid in both ears    Past Surgical History:  Procedure Laterality Date  . AMPUTATION Left 09/26/2019   Procedure: LEFT LITTLE FINGER TIP, AMPUTATION;  Surgeon: Hessie Knows, MD;  Location: [facility];  Service: Orthopedics;  Laterality: Left;  . Colles' fracture right 1956    . COLONOSCOPY  2013  . COLONOSCOPY WITH PROPOFOL N/A 07/11/2018   Procedure: COLONOSCOPY WITH PROPOFOL;  Surgeon: Lucilla Lame, MD;  Location: [place];  Service: Endoscopy;  Laterality: N/A;  . FRACTURE SURGERY    . fractured left arm radius and  ulna 1955    . fractured right tibiofibular 2009    . HAND SURGERY  06/02/11  . HERNIA REPAIR Right 2010, 2015  . HERNIA REPAIR Left 2015  . herniorpahy, rt  05/09/2009  . NERVE REPAIR Right 08/12/2015   Procedure: EXPLORATION REPAIR  NERVE, DEBRIDEMENT FRACTURE RIGHT INDEX MIDDLE FINGER;  Surgeon: Daryll Brod, MD;  Location: [place];  Service: Orthopedics;  Laterality: Right;  . POLYPECTOMY  07/11/2018   Procedure: POLYPECTOMY;  Surgeon: Lucilla Lame, MD;  Location: [place];  Service: Endoscopy;;  . ruptured left Achilles tendon 1980    .  surgery for cubital tunnel syndrome and left arm 1999    . tonsillectomy 1942     Family History  Problem Relation Age of Onset  . Parkinson's disease Mother   . Arthritis Father   . Cancer Brother    Social History   Socioeconomic History  . Marital status: Widowed    Spouse name: Not on file  . Number of children: 3  . Years of education: Not on file  . Highest education level: Bachelor's degree (e.g., BA, AB, BS)  Occupational History  . Occupation: Retired  Tobacco Use  . Smoking status: Never Smoker   . Smokeless tobacco: Never Used  . Tobacco comment: smoking cessation materials not required  Vaping Use  . Vaping Use: Never used  Substance and Sexual Activity  . Alcohol use: Yes    Alcohol/week: 0.0 standard drinks    Comment: rare; 1 to 2 beers a month  . Drug use: No  . Sexual activity: Not Currently  Other Topics Concern  . Not on file  Social History Narrative  . Not on file   Social Determinants of Health   Financial Resource Strain: Low Risk   . Difficulty of Paying Living Expenses: Not hard at all  Food Insecurity: No Food Insecurity  . Worried About Charity fundraiser in the Last Year: Never true  . Ran Out of Food in the Last Year: Never true  Transportation Needs: No Transportation Needs  . Lack of Transportation (Medical): No  . Lack of Transportation (Non-Medical): No  Physical Activity: Insufficiently Active  . Days of Exercise per Week: 2 days  . Minutes of Exercise per Session: 60 min  Stress: No Stress Concern Present  . Feeling of Stress : Not at all  Social Connections: Moderately Isolated  . Frequency of Communication with Friends and Family: More than three times a week  . Frequency of Social Gatherings with Friends and Family: Twice a week  . Attends Religious Services: More than 4 times per year  . Active Member of Clubs or Organizations: No  . Attends Archivist Meetings: Never  . Marital Status: Widowed    Tobacco Counseling Counseling given: Not Answered Comment: smoking cessation materials not required   Clinical Intake:  Pre-visit preparation completed: Yes  Pain : No/denies pain     BMI - recorded: 26.88 Nutritional Status: BMI 25 -29 Overweight Nutritional Risks: None Diabetes: No  How often do you need to have someone help you when you read instructions, pamphlets, or other written materials from your doctor or pharmacy?: 1 - Never    Interpreter Needed?: No  Information entered by :: Clemetine Marker  LPN   Activities of Daily Living In your present state of health, do you have any difficulty performing the following activities: 09/15/2020  Hearing? Y  Comment wears hearing aids  Vision? N  Difficulty concentrating or making decisions? N  Walking or climbing stairs? Y  Dressing or bathing? N  Doing errands, shopping? N  Preparing Food and eating ? N  Using the Toilet? N  In the past six months, have you accidently leaked urine? N  Do you have problems with loss of bowel control? N  Managing your Medications? N  Managing your Finances? N  Housekeeping or managing your Housekeeping? N  Some recent data might be hidden    Patient Care Team: [name], MD as PCP - General (Family Medicine) Corey Skains, MD as Consulting Physician (Cardiology)  Indicate  any recent Medical Services you may have received from other than [facility] providers in the past year (date may be approximate).     Assessment:   This is a routine wellness examination for Marco Sims.  Hearing/Vision screen  Hearing Screening   125Hz  250Hz  500Hz  1000Hz  2000Hz  3000Hz  4000Hz  6000Hz  8000Hz   Right ear:           Left ear:           Comments[facility]   Vision Screening Comments: Eye exams completed at the [place] [facility] in [place].   Dietary issues and exercise activities discussed: Current Exercise Habits: Home exercise routine, Type of exercise: strength training/weights;treadmill;Other - see comments (stationary bike), Time (Minutes): 60, Frequency (Times/Week): 2, Weekly Exercise (Minutes/Week): 120, Intensity: Mild, Exercise limited by: orthopedic condition(s)  Goals    . DIET - INCREASE WATER INTAKE     Recommend drinking 6-8 glasses of water per day     . Exercise 150 min/wk Moderate Activity     Recommend to exercise for at least 150 minutes per week.      Depression Screen PHQ 2/9 Scores 09/15/2020 03/09/2020 09/16/2019 09/15/2019 09/11/2018 01/09/2018 08/30/2017  PHQ - 2 Score 0 0 0 0 0 0 0   PHQ- 9 Score - 0 0 - - 0 -    Fall Risk Fall Risk  09/15/2020 03/09/2020 09/16/2019 09/15/2019 09/11/2018  Falls in the past year? 1 1 0 1 1  Number falls in past yr: 1 1 - 1 1  Injury with Fall? 0 0 - 0 0  Risk for fall due to : History of fall(s) History of fall(s);Impaired balance/gait - History of fall(s) -  Risk for fall due to: Comment - - - - -  Follow up Falls prevention discussed Falls evaluation completed Falls evaluation completed Falls prevention discussed Falls prevention discussed    FALL RISK PREVENTION PERTAINING TO THE HOME:  Any stairs in or around the home? Yes  If so, are there any without handrails? No  Home free of loose throw rugs in walkways, pet beds, electrical cords, etc? Yes  Adequate lighting in your home to reduce risk of falls? Yes   ASSISTIVE DEVICES UTILIZED TO PREVENT FALLS:  Life alert? No  Use of a cane, walker or w/c? No  Grab bars in the bathroom? No  Shower chair or bench in shower? No  Elevated toilet seat or a handicapped toilet? No   TIMED UP AND GO:  Was the test performed? Yes .  Length of time to ambulate 10 feet: 6 sec.   Gait steady and fast without use of assistive device  Cognitive Function:     6CIT Screen 09/15/2020 09/15/2019 09/11/2018 08/30/2017  What Year? 0 points 0 points 0 points 4 points  What month? 0 points 0 points 0 points 0 points  What time? 0 points 0 points 0 points 0 points  Count back from 20 0 points 0 points 0 points 0 points  Months in reverse 0 points 0 points 0 points 0 points  Repeat phrase 2 points 0 points 0 points 0 points  Total Score 2 0 0 4    Immunizations Immunization History  Administered Date(s) Administered  . Influenza Split 04/26/2012, 05/24/2019  . Influenza Whole 06/13/2007  . Influenza, High Dose Seasonal PF 05/01/2018, 05/24/2019  . Influenza,inj,Quad PF,6+ Mos 04/18/2013, 06/03/2015  . Influenza-Unspecified 06/17/2012, 04/21/2013, 05/07/2013, 05/07/2014, 05/26/2014, 06/08/2015,  05/07/2016, 05/07/2017, 05/30/2017  . PFIZER(Purple Top)SARS-COV-2 Vaccination 11/05/2019, 11/26/2019  .  Pneumococcal Conjugate-13 01/27/2015  . Pneumococcal Polysaccharide-23 08/30/2017, 10/31/2017, 05/24/2019  . Td 08/08/2003  . Tdap 01/16/2011, 03/20/2011  . Zoster 06/13/2013  . Zoster Recombinat (Shingrix) 05/24/2019, 10/02/2019    TDAP status: Up to date    Flu Vaccine status: Due, Education has been provided regarding the importance of this vaccine. Advised may receive this vaccine at local pharmacy or Health Dept. Aware to provide a copy of the vaccination record if obtained from local pharmacy or Health Dept. Verbalized acceptance and understanding.  Pneumococcal vaccine status: Up to date  Covid-19 vaccine status: Completed vaccines  Qualifies for Shingles Vaccine? Yes   Zostavax completed Yes   Shingrix Completed?: Yes  Screening Tests Health Maintenance  Topic Date Due  . COVID-19 Vaccine (3 - Booster for Pfizer series) 05/27/2020  . INFLUENZA VACCINE  11/04/2020 (Originally 03/07/2020)  . TETANUS/TDAP  03/19/2021  . PNA vac Low Risk Adult  Completed    Health Maintenance  Health Maintenance Due  Topic Date Due  . COVID-19 Vaccine (3 - Booster for Pfizer series) 05/27/2020    Colorectal cancer screening: No longer required.   Lung Cancer Screening: (Low Dose CT Chest recommended if Age 32-80 years, 30 pack-year currently smoking OR have quit w/in 15years.) does not qualify.   Additional Screening:  Hepatitis C Screening: does not qualify;   Vision Screening: Recommended annual ophthalmology exams for early detection of glaucoma and other disorders of the eye. Is the patient up to date with their annual eye exam?  Yes  Who is the provider or what is the name of the office in which the patient attends annual eye exams? [facility]  Dental Screening: Recommended annual dental exams for proper oral hygiene  Community Resource Referral / Chronic Care  Management: CRR required this visit?  No   CCM required this visit?  No      Plan:     I have personally reviewed and noted the following in the patient's chart:   . Medical and social history . Use of alcohol, tobacco or illicit drugs  . Current medications and supplements . Functional ability and status . Nutritional status . Physical activity . Advanced directives . List of other physicians . Hospitalizations, surgeries, and ER visits in previous 12 months . Vitals . Screenings to include cognitive, depression, and falls . Referrals and appointments  In addition, I have reviewed and discussed with patient certain preventive protocols, quality metrics, and best practice recommendations. A written personalized care plan for preventive services as well as general preventive health recommendations were provided to patient.     Clemetine Marker, LPN   02/04/6966   Nurse Notes: pt concerned about gait abnormality and history of frequent falls. Pt interested in neurology referral due to mother having hx of parkinson's.

## 2020-09-15 NOTE — Progress Notes (Signed)
Date:  09/15/2020   Name:  Marco Sims   DOB:  June 17, 1934   MRN:  [id number]   Chief Complaint: Hypertension and Gastroesophageal Reflux  Hypertension This is a chronic problem. The current episode started more than 1 year ago. The problem has been gradually improving since onset. The problem is controlled. Pertinent negatives include no anxiety, blurred vision, chest pain, headaches, malaise/fatigue, neck pain, orthopnea, palpitations, peripheral edema, PND, shortness of breath or sweats. There are no associated agents to hypertension. Risk factors for coronary artery disease include dyslipidemia. Past treatments include calcium channel blockers. The current treatment provides moderate improvement. There are no compliance problems.  There is no history of angina, kidney disease, CAD/MI, CVA, heart failure, left ventricular hypertrophy, PVD or retinopathy. There is no history of chronic renal disease, a hypertension causing med or renovascular disease.  Gastroesophageal Reflux He reports no abdominal pain, no belching, no chest pain, no choking, no coughing, no dysphagia, no early satiety, no globus sensation, no heartburn, no hoarse voice, no nausea, no sore throat, no stridor, no tooth decay, no water brash or no wheezing. The problem has been gradually improving. He has tried a PPI for the symptoms. The treatment provided mild relief.  Hyperlipidemia This is a chronic problem. The current episode started more than 1 year ago. The problem is controlled. Recent lipid tests were reviewed and are normal. He has no history of chronic renal disease, diabetes, hypothyroidism, liver disease, obesity or nephrotic syndrome. There are no known factors aggravating his hyperlipidemia. Pertinent negatives include no chest pain, focal sensory loss, focal weakness, leg pain, myalgias or shortness of breath. Treatments tried: patient told by dr Rubye Oaks causes muscle loss.    Lab Results  Component  Value Date   CREATININE 0.86 09/16/2019   BUN 18 09/16/2019   NA 144 09/16/2019   K 4.4 09/16/2019   CL 108 (H) 09/16/2019   CO2 24 09/16/2019   Lab Results  Component Value Date   CHOL 242 (H) 09/16/2019   HDL 47 09/16/2019   LDLCALC 167 (H) 09/16/2019   LDLDIRECT 168.9 01/10/2011   TRIG 156 (H) 09/16/2019   CHOLHDL 5.6 (H) 01/09/2018   Lab Results  Component Value Date   TSH 2.62 01/10/2011   No results found for: HGBA1C Lab Results  Component Value Date   WBC 5.0 09/26/2019   HGB 12.1 (L) 09/26/2019   HCT 37.7 (L) 09/26/2019   MCV 79.7 (L) 09/26/2019   PLT 228 09/26/2019   Lab Results  Component Value Date   ALT 27 06/14/2018   AST 27 06/14/2018   ALKPHOS 87 06/14/2018   BILITOT 1.1 06/14/2018     Review of Systems  Constitutional: Negative for chills, fever and malaise/fatigue.  HENT: Negative for drooling, ear discharge, ear pain, hoarse voice and sore throat.   Eyes: Negative for blurred vision.  Respiratory: Negative for cough, choking, shortness of breath and wheezing.   Cardiovascular: Negative for chest pain, palpitations, orthopnea, leg swelling and PND.  Gastrointestinal: Negative for abdominal pain, blood in stool, constipation, diarrhea, dysphagia, heartburn and nausea.  Endocrine: Negative for polydipsia.  Genitourinary: Negative for dysuria, frequency, hematuria and urgency.  Musculoskeletal: Negative for back pain, myalgias and neck pain.  Skin: Negative for rash.  Allergic/Immunologic: Negative for environmental allergies.  Neurological: Negative for dizziness, focal weakness and headaches.  Hematological: Does not bruise/bleed easily.  Psychiatric/Behavioral: Negative for suicidal ideas. The patient is not nervous/anxious.     Patient Active  Problem List   Diagnosis Date Noted  . 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 Hematochezia   . Benign neoplasm of ascending colon   . Essential hypertension 01/09/2018  . GERD 11/04/2009  . ERECTILE  DYSFUNCTION 07/28/2008  . DERMATITIS 07/28/2008  . LABYRINTHITIS 11/05/2007  . HYPERLIPIDEMIA 09/24/2007  . ANEMIA 09/24/2007  . CONDUCTIVE HEARING LOSS UNILATERAL 09/24/2007  . ARTHRITIS 09/24/2007  . UNS ADVRS EFF OTH RX MEDICINAL&BIOLOGICAL SBSTNC 09/24/2007    No Known Allergies  Past Surgical History:  Procedure Laterality Date  . AMPUTATION Left 09/26/2019   Procedure: LEFT LITTLE FINGER TIP, AMPUTATION;  Surgeon: Hessie Knows, MD;  Location: [facility];  Service: Orthopedics;  Laterality: Left;  . Colles' fracture right 1956    . COLONOSCOPY  2013  . COLONOSCOPY WITH PROPOFOL N/A 07/11/2018   Procedure: COLONOSCOPY WITH PROPOFOL;  Surgeon: Lucilla Lame, MD;  Location: [place];  Service: Endoscopy;  Laterality: N/A;  . FRACTURE SURGERY    . fractured left arm radius and  ulna 1955    . fractured right tibiofibular 2009    . HAND SURGERY  06/02/11  . HERNIA REPAIR Right 2010, 2015  . HERNIA REPAIR Left 2015  . herniorpahy, rt  05/09/2009  . NERVE REPAIR Right 08/12/2015   Procedure: EXPLORATION REPAIR  NERVE, DEBRIDEMENT FRACTURE RIGHT INDEX MIDDLE FINGER;  Surgeon: Daryll Brod, MD;  Location: [place];  Service: Orthopedics;  Laterality: Right;  . POLYPECTOMY  07/11/2018   Procedure: POLYPECTOMY;  Surgeon: Lucilla Lame, MD;  Location: [place];  Service: Endoscopy;;  . ruptured left Achilles tendon 1980    . surgery for cubital tunnel syndrome and left arm 1999    . tonsillectomy 1942      Social History   Tobacco Use  . Smoking status: Never Smoker  . Smokeless tobacco: Never Used  . Tobacco comment: smoking cessation materials not required  Vaping Use  . Vaping Use: Never used  Substance Use Topics  . Alcohol use: Yes    Alcohol/week: 0.0 standard drinks    Comment: rare; 1 to 2 beers a month  . Drug use: No     Medication list has been reviewed and updated.  Current Meds  Medication Sig  . amLODipine (NORVASC) 10 MG  tablet Take 1 tablet (10 mg total) by mouth daily.  . mupirocin ointment (BACTROBAN) 2 % Apply to crusts on the left lower leg QAM.  . omeprazole (PRILOSEC) 20 MG capsule Take 1 capsule (20 mg total) by mouth daily.  . rosuvastatin (CRESTOR) 10 MG tablet Take 10 mg by mouth daily. Cardiology Dr  . triamcinolone ointment (KENALOG) 0.1 % Apply to left lower leg QHS as directed.    PHQ 2/9 Scores 09/15/2020 03/09/2020 09/16/2019 09/15/2019  PHQ - 2 Score 0 0 0 0  PHQ- 9 Score - 0 0 -    GAD 7 : Generalized Anxiety Score 03/09/2020 09/16/2019  Nervous, Anxious, on Edge 0 0  Control/stop worrying 0 0  Worry too much - different things 0 0  Trouble relaxing 0 0  Restless 0 0  Easily annoyed or irritable 0 0  Afraid - awful might happen 0 0  Total GAD 7 Score 0 0  Anxiety Difficulty Not difficult at all -    BP Readings from Last 3 Encounters:  09/15/20 (!) 144/78  09/15/20 (!) 144/78  03/09/20 130/68    Physical Exam Vitals and nursing note reviewed.  HENT:     Head: Normocephalic.  Right Ear: External ear normal.     Left Ear: External ear normal.     Nose: Nose normal.     Mouth/Throat:     Mouth: Oropharynx is clear and moist. Mucous membranes are moist.     Pharynx: Oropharynx is clear.  Eyes:     General: No scleral icterus.       Right eye: No discharge.        Left eye: No discharge.     Extraocular Movements: EOM normal.     Conjunctiva/sclera: Conjunctivae normal.     Pupils: Pupils are equal, round, and reactive to light.  Neck:     Thyroid: No thyromegaly.     Vascular: No JVD.     Trachea: No tracheal deviation.  Cardiovascular:     Rate and Rhythm: Normal rate and regular rhythm.     Pulses: Intact distal pulses.     Heart sounds: Normal heart sounds, S1 normal and S2 normal. No murmur heard.  No systolic murmur is present.  No diastolic murmur is present. No friction rub. No gallop. No S3 or S4 sounds.   Pulmonary:     Effort: No respiratory distress.      Breath sounds: Normal breath sounds. No wheezing, rhonchi or rales.  Abdominal:     General: Bowel sounds are normal.     Palpations: Abdomen is soft. There is no hepatosplenomegaly or mass.     Tenderness: There is no abdominal tenderness. There is no CVA tenderness, guarding or rebound.  Musculoskeletal:        General: No tenderness or edema. Normal range of motion.     Cervical back: Normal range of motion and neck supple.  Lymphadenopathy:     Cervical: No cervical adenopathy.  Skin:    General: Skin is warm.     Findings: No rash.  Neurological:     Mental Status: He is alert and oriented to person, place, and time.     Cranial Nerves: No cranial nerve deficit.     Deep Tendon Reflexes: Strength normal and reflexes are normal and symmetric.     Wt Readings from Last 3 Encounters:  09/15/20 182 lb (82.6 kg)  09/15/20 182 lb (82.6 kg)  03/09/20 177 lb (80.3 kg)    BP (!) 144/78   Pulse 67   Temp 97.6 F (36.4 C) (Oral)   Ht 5\' 9"  (1.753 m)   Wt 182 lb (82.6 kg)   SpO2 99%   BMI 26.88 kg/m   Assessment and Plan:  1. Essential hypertension Chronic.  Uncontrolled.  Stable.  Blood pressure is 144/78.  Patient is currently on amlodipine 10 mg once a day but we will add lisinopril 5 mg once a day and will recheck renal function panel. - amLODipine (NORVASC) 10 MG tablet; Take 1 tablet (10 mg total) by mouth daily.  Dispense: 90 tablet; Refill: 0 - Renal Function Panel - lisinopril (ZESTRIL) 5 MG tablet; Take 1 tablet (5 mg total) by mouth daily.  Dispense: 90 tablet; Refill: 0  2. Gastroesophageal reflux disease Chronic.  Controlled.  Stable.  Continue omeprazole 20 mg once a day. - omeprazole (PRILOSEC) 20 MG capsule; Take 1 capsule (20 mg total) by mouth daily.  Dispense: 90 capsule; Refill: 1  3. Familial hypercholesterolemia Chronic.  Probably uncontrolled.  Stable.  His friend convinced him not to take this medicine because of what it does to muscles.  I assured  him that it was probably okay but we  will check a lipid panel to see how he is doing with this and adjust accordingly. - Lipid Panel With LDL/HDL Ratio

## 2020-09-15 NOTE — Patient Instructions (Signed)
[name] , Thank you for taking time to come for your Medicare Wellness Visit. I appreciate your ongoing commitment to your health goals. Please review the following plan we discussed and let me know if I can assist you in the future.   Screening recommendations/referrals: Colonoscopy: no longer required Recommended yearly ophthalmology/optometry visit for glaucoma screening and checkup Recommended yearly dental visit for hygiene and checkup  Vaccinations: Influenza vaccine: due Pneumococcal vaccine: done 05/24/19 Tdap vaccine: done 03/20/11 Shingles vaccine: done 05/24/19 & 10/02/19   Covid-19: done 11/05/19 & 11/26/19  Advanced directives: Advance directive discussed with you today. I have provided a copy for you to complete at home and have notarized. Once this is complete please bring a copy in to our office so we can scan it into your chart.  Conditions/risks identified: Recommend drinking 6-8 glasses of water per day   Next appointment: Follow up in one year for your annual wellness visit.   Preventive Care 52 Years and Older, Male Preventive care refers to lifestyle choices and visits with your health care provider that can promote health and wellness. What does preventive care include?  A yearly physical exam. This is also called an annual well check.  Dental exams once or twice a year.  Routine eye exams. Ask your health care provider how often you should have your eyes checked.  Personal lifestyle choices, including:  Daily care of your teeth and gums.  Regular physical activity.  Eating a healthy diet.  Avoiding tobacco and drug use.  Limiting alcohol use.  Practicing safe sex.  Taking low doses of aspirin every day.  Taking vitamin and mineral supplements as recommended by your health care provider. What happens during an annual well check? The services and screenings done by your health care provider during your annual well check will depend on your age,  overall health, lifestyle risk factors, and family history of disease. Counseling  Your health care provider may ask you questions about your:  Alcohol use.  Tobacco use.  Drug use.  Emotional well-being.  Home and relationship well-being.  Sexual activity.  Eating habits.  History of falls.  Memory and ability to understand (cognition).  Work and work Statistician. Screening  You may have the following tests or measurements:  Height, weight, and BMI.  Blood pressure.  Lipid and cholesterol levels. These may be checked every 5 years, or more frequently if you are over 39 years old.  Skin check.  Lung cancer screening. You may have this screening every year starting at age 105 if you have a 30-pack-year history of smoking and currently smoke or have quit within the past 15 years.  Fecal occult blood test (FOBT) of the stool. You may have this test every year starting at age 47.  Flexible sigmoidoscopy or colonoscopy. You may have a sigmoidoscopy every 5 years or a colonoscopy every 10 years starting at age 85.  Prostate cancer screening. Recommendations will vary depending on your family history and other risks.  Hepatitis C blood test.  Hepatitis B blood test.  Sexually transmitted disease (STD) testing.  Diabetes screening. This is done by checking your blood sugar (glucose) after you have not eaten for a while (fasting). You may have this done every 1-3 years.  Abdominal aortic aneurysm (AAA) screening. You may need this if you are a current or former smoker.  Osteoporosis. You may be screened starting at age 8 if you are at high risk. Talk with your health care provider about  your test results, treatment options, and if necessary, the need for more tests. Vaccines  Your health care provider may recommend certain vaccines, such as:  Influenza vaccine. This is recommended every year.  Tetanus, diphtheria, and acellular pertussis (Tdap, Td) vaccine. You may  need a Td booster every 10 years.  Zoster vaccine. You may need this after age 60.  Pneumococcal 13-valent conjugate (PCV13) vaccine. One dose is recommended after age 67.  Pneumococcal polysaccharide (PPSV23) vaccine. One dose is recommended after age 27. Talk to your health care provider about which screenings and vaccines you need and how often you need them. This information is not intended to replace advice given to you by your health care provider. Make sure you discuss any questions you have with your health care provider. Document Released: 08/20/2015 Document Revised: 04/12/2016 Document Reviewed: 05/25/2015 Elsevier Interactive Patient Education  2017 [place] Prevention in the Home Falls can cause injuries. They can happen to people of all ages. There are many things you can do to make your home safe and to help prevent falls. What can I do on the outside of my home?  Regularly fix the edges of walkways and driveways and fix any cracks.  Remove anything that might make you trip as you walk through a door, such as a raised step or threshold.  Trim any bushes or trees on the path to your home.  Use bright outdoor lighting.  Clear any walking paths of anything that might make someone trip, such as rocks or tools.  Regularly check to see if handrails are loose or broken. Make sure that both sides of any steps have handrails.  Any raised decks and porches should have guardrails on the edges.  Have any leaves, snow, or ice cleared regularly.  Use sand or salt on walking paths during winter.  Clean up any spills in your garage right away. This includes oil or grease spills. What can I do in the bathroom?  Use night lights.  Install grab bars by the toilet and in the tub and shower. Do not use towel bars as grab bars.  Use non-skid mats or decals in the tub or shower.  If you need to sit down in the shower, use a plastic, non-slip stool.  Keep the floor  dry. Clean up any water that spills on the floor as soon as it happens.  Remove soap buildup in the tub or shower regularly.  Attach bath mats securely with double-sided non-slip rug tape.  Do not have throw rugs and other things on the floor that can make you trip. What can I do in the bedroom?  Use night lights.  Make sure that you have a light by your bed that is easy to reach.  Do not use any sheets or blankets that are too big for your bed. They should not hang down onto the floor.  Have a firm chair that has side arms. You can use this for support while you get dressed.  Do not have throw rugs and other things on the floor that can make you trip. What can I do in the kitchen?  Clean up any spills right away.  Avoid walking on wet floors.  Keep items that you use a lot in easy-to-reach places.  If you need to reach something above you, use a strong step stool that has a grab bar.  Keep electrical cords out of the way.  Do not use floor polish or wax  that makes floors slippery. If you must use wax, use non-skid floor wax.  Do not have throw rugs and other things on the floor that can make you trip. What can I do with my stairs?  Do not leave any items on the stairs.  Make sure that there are handrails on both sides of the stairs and use them. Fix handrails that are broken or loose. Make sure that handrails are as long as the stairways.  Check any carpeting to make sure that it is firmly attached to the stairs. Fix any carpet that is loose or worn.  Avoid having throw rugs at the top or bottom of the stairs. If you do have throw rugs, attach them to the floor with carpet tape.  Make sure that you have a light switch at the top of the stairs and the bottom of the stairs. If you do not have them, ask someone to add them for you. What else can I do to help prevent falls?  Wear shoes that:  Do not have high heels.  Have rubber bottoms.  Are comfortable and fit you  well.  Are closed at the toe. Do not wear sandals.  If you use a stepladder:  Make sure that it is fully opened. Do not climb a closed stepladder.  Make sure that both sides of the stepladder are locked into place.  Ask someone to hold it for you, if possible.  Clearly mark and make sure that you can see:  Any grab bars or handrails.  First and last steps.  Where the edge of each step is.  Use tools that help you move around (mobility aids) if they are needed. These include:  Canes.  Walkers.  Scooters.  Crutches.  Turn on the lights when you go into a dark area. Replace any light bulbs as soon as they burn out.  Set up your furniture so you have a clear path. Avoid moving your furniture around.  If any of your floors are uneven, fix them.  If there are any pets around you, be aware of where they are.  Review your medicines with your doctor. Some medicines can make you feel dizzy. This can increase your chance of falling. Ask your doctor what other things that you can do to help prevent falls. This information is not intended to replace advice given to you by your health care provider. Make sure you discuss any questions you have with your health care provider. Document Released: 05/20/2009 Document Revised: 12/30/2015 Document Reviewed: 08/28/2014 Elsevier Interactive Patient Education  2017 [employer].

## 2020-09-16 LAB — LIPID PANEL WITH LDL/HDL RATIO
Cholesterol, Total: 235 mg/dL — ABNORMAL HIGH (ref 100–199)
HDL: 50 mg/dL (ref >39)
LDL Chol Calc (NIH): 160 mg/dL — ABNORMAL HIGH (ref 0–99)
LDL/HDL Ratio: 3.2 ratio (ref 0.0–3.6)
Triglycerides: 139 mg/dL (ref 0–149)
VLDL Cholesterol Cal: 25 mg/dL (ref 5–40)

## 2020-09-16 LAB — RENAL FUNCTION PANEL
Albumin: 4.5 g/dL (ref 3.6–4.6)
BUN/Creatinine Ratio: 11 (ref 10–24)
BUN: 11 mg/dL (ref 8–27)
CO2: 23 mmol/L (ref 20–29)
Calcium: 9.2 mg/dL (ref 8.6–10.2)
Chloride: 105 mmol/L (ref 96–106)
Creatinine, Ser: 0.98 mg/dL (ref 0.76–1.27)
GFR calc Af Amer: 82 mL/min/{1.73_m2} (ref >59)
GFR calc non Af Amer: 71 mL/min/{1.73_m2} (ref >59)
Glucose: 109 mg/dL — ABNORMAL HIGH (ref 65–99)
Phosphorus: 2.8 mg/dL (ref 2.8–4.1)
Potassium: 4.3 mmol/L (ref 3.5–5.2)
Sodium: 145 mmol/L — ABNORMAL HIGH (ref 134–144)

## 2020-09-27 DIAGNOSIS — Z8616 Personal history of COVID-19: Secondary | ICD-10-CM | POA: Diagnosis not present

## 2020-09-27 DIAGNOSIS — I10 Essential (primary) hypertension: Secondary | ICD-10-CM | POA: Diagnosis not present

## 2020-09-27 DIAGNOSIS — K219 Gastro-esophageal reflux disease without esophagitis: Secondary | ICD-10-CM | POA: Diagnosis not present

## 2020-09-27 DIAGNOSIS — R072 Precordial pain: Secondary | ICD-10-CM | POA: Diagnosis not present

## 2020-09-27 DIAGNOSIS — R55 Syncope and collapse: Secondary | ICD-10-CM | POA: Diagnosis not present

## 2020-09-27 DIAGNOSIS — R270 Ataxia, unspecified: Secondary | ICD-10-CM | POA: Diagnosis not present

## 2020-09-27 DIAGNOSIS — E782 Mixed hyperlipidemia: Secondary | ICD-10-CM | POA: Diagnosis not present

## 2020-09-27 DIAGNOSIS — D649 Anemia, unspecified: Secondary | ICD-10-CM | POA: Diagnosis not present

## 2020-10-28 ENCOUNTER — Encounter: Payer: Self-pay | Admitting: Family Medicine

## 2020-10-28 ENCOUNTER — Other Ambulatory Visit: Payer: Self-pay

## 2020-10-28 ENCOUNTER — Ambulatory Visit (INDEPENDENT_AMBULATORY_CARE_PROVIDER_SITE_OTHER): Payer: Medicare HMO | Admitting: Family Medicine

## 2020-10-28 VITALS — BP 120/70 | HR 64 | Ht 69.0 in | Wt 181.0 lb

## 2020-10-28 DIAGNOSIS — K219 Gastro-esophageal reflux disease without esophagitis: Secondary | ICD-10-CM

## 2020-10-28 DIAGNOSIS — Z9189 Other specified personal risk factors, not elsewhere classified: Secondary | ICD-10-CM | POA: Diagnosis not present

## 2020-10-28 DIAGNOSIS — E7801 Familial hypercholesterolemia: Secondary | ICD-10-CM | POA: Diagnosis not present

## 2020-10-28 DIAGNOSIS — I10 Essential (primary) hypertension: Secondary | ICD-10-CM

## 2020-10-28 MED ORDER — LISINOPRIL 5 MG PO TABS: 5.0000 mg | ORAL_TABLET | Freq: Every day | ORAL | 1 refills | Status: DC

## 2020-10-28 MED ORDER — OMEPRAZOLE 20 MG PO CPDR: 20.0000 mg | DELAYED_RELEASE_CAPSULE | Freq: Every day | ORAL | 1 refills | Status: DC

## 2020-10-28 MED ORDER — AMLODIPINE BESYLATE 10 MG PO TABS: 10.0000 mg | ORAL_TABLET | Freq: Every day | ORAL | 1 refills | Status: DC

## 2020-10-28 NOTE — Progress Notes (Signed)
Date:  10/28/2020   Name:  Marco Sims   DOB:  04/10/1934   MRN:  [id number]   Chief Complaint: Hypertension (Added lisinopril to amlodipine) and Hyperlipidemia (Pt did not start cholesterol med b/c he got confused about meds)  Hypertension This is a chronic problem. The current episode started more than 1 year ago. The problem has been gradually improving since onset. The problem is controlled. Pertinent negatives include no anxiety, blurred vision, chest pain, headaches, malaise/fatigue, neck pain, orthopnea, palpitations, peripheral edema, PND, shortness of breath or sweats. There are no associated agents to hypertension. There are no known risk factors for coronary artery disease. Past treatments include ACE inhibitors and calcium channel blockers. The current treatment provides moderate improvement. There are no compliance problems.  There is no history of angina, kidney disease, CAD/MI, CVA, heart failure, left ventricular hypertrophy, PVD or retinopathy. There is no history of chronic renal disease, a hypertension causing med or renovascular disease.  Hyperlipidemia This is a chronic problem. The current episode started more than 1 year ago. The problem is uncontrolled. Recent lipid tests were reviewed and are high. He has no history of chronic renal disease, diabetes, hypothyroidism, liver disease, obesity or nephrotic syndrome. Pertinent negatives include no chest pain, focal sensory loss, focal weakness, leg pain, myalgias or shortness of breath. Current antihyperlipidemic treatment includes statins (not taking). Compliance problems: confused about rosuvastatin.     Lab Results  Component Value Date   CREATININE 0.98 09/15/2020   BUN 11 09/15/2020   NA 145 (H) 09/15/2020   K 4.3 09/15/2020   CL 105 09/15/2020   CO2 23 09/15/2020   Lab Results  Component Value Date   CHOL 235 (H) 09/15/2020   HDL 50 09/15/2020   LDLCALC 160 (H) 09/15/2020   LDLDIRECT 168.9 01/10/2011   TRIG  139 09/15/2020   CHOLHDL 5.6 (H) 01/09/2018   Lab Results  Component Value Date   TSH 2.62 01/10/2011   No results found for: HGBA1C Lab Results  Component Value Date   WBC 5.0 09/26/2019   HGB 12.1 (L) 09/26/2019   HCT 37.7 (L) 09/26/2019   MCV 79.7 (L) 09/26/2019   PLT 228 09/26/2019   Lab Results  Component Value Date   ALT 27 06/14/2018   AST 27 06/14/2018   ALKPHOS 87 06/14/2018   BILITOT 1.1 06/14/2018     Review of Systems  Constitutional: Negative for chills, fever and malaise/fatigue.  HENT: Negative for drooling, ear discharge, ear pain and sore throat.   Eyes: Negative for blurred vision.  Respiratory: Negative for cough, shortness of breath and wheezing.   Cardiovascular: Negative for chest pain, palpitations, orthopnea, leg swelling and PND.  Gastrointestinal: Negative for abdominal pain, blood in stool, constipation, diarrhea and nausea.  Endocrine: Negative for polydipsia.  Genitourinary: Negative for dysuria, frequency, hematuria and urgency.  Musculoskeletal: Negative for back pain, myalgias and neck pain.  Skin: Negative for rash.  Allergic/Immunologic: Negative for environmental allergies.  Neurological: Negative for dizziness, focal weakness and headaches.  Hematological: Does not bruise/bleed easily.  Psychiatric/Behavioral: Negative for suicidal ideas. The patient is not nervous/anxious.     Patient Active Problem List   Diagnosis Date Noted  . 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 Hematochezia   . Benign neoplasm of ascending colon   . Essential hypertension 01/09/2018  . GERD 11/04/2009  . ERECTILE DYSFUNCTION 07/28/2008  . DERMATITIS 07/28/2008  . LABYRINTHITIS 11/05/2007  . HYPERLIPIDEMIA 09/24/2007  . ANEMIA 09/24/2007  . CONDUCTIVE  HEARING LOSS UNILATERAL 09/24/2007  . ARTHRITIS 09/24/2007  . UNS ADVRS EFF OTH RX MEDICINAL&BIOLOGICAL SBSTNC 09/24/2007    No Known Allergies  Past Surgical History:  Procedure Laterality Date  .  AMPUTATION Left 09/26/2019   Procedure: LEFT LITTLE FINGER TIP, AMPUTATION;  Surgeon: Hessie Knows, MD;  Location: [facility];  Service: Orthopedics;  Laterality: Left;  . Colles' fracture right 1956    . COLONOSCOPY  2013  . COLONOSCOPY WITH PROPOFOL N/A 07/11/2018   Procedure: COLONOSCOPY WITH PROPOFOL;  Surgeon: Lucilla Lame, MD;  Location: [facility];  Service: Endoscopy;  Laterality: N/A;  . FRACTURE SURGERY    . fractured left arm radius and  ulna 1955    . fractured right tibiofibular 2009    . HAND SURGERY  06/02/11  . HERNIA REPAIR Right 2010, 2015  . HERNIA REPAIR Left 2015  . herniorpahy, rt  05/09/2009  . NERVE REPAIR Right 08/12/2015   Procedure: EXPLORATION REPAIR  NERVE, DEBRIDEMENT FRACTURE RIGHT INDEX MIDDLE FINGER;  Surgeon: Daryll Brod, MD;  Location: [place];  Service: Orthopedics;  Laterality: Right;  . POLYPECTOMY  07/11/2018   Procedure: POLYPECTOMY;  Surgeon: Lucilla Lame, MD;  Location: [facility];  Service: Endoscopy;;  . ruptured left Achilles tendon 1980    . surgery for cubital tunnel syndrome and left arm 1999    . tonsillectomy 1942      Social History   Tobacco Use  . Smoking status: Never Smoker  . Smokeless tobacco: Never Used  . Tobacco comment: smoking cessation materials not required  Vaping Use  . Vaping Use: Never used  Substance Use Topics  . Alcohol use: Yes    Alcohol/week: 0.0 standard drinks    Comment: rare; 1 to 2 beers a month  . Drug use: No     Medication list has been reviewed and updated.  Current Meds  Medication Sig  . amLODipine (NORVASC) 10 MG tablet Take 1 tablet (10 mg total) by mouth daily.  Marland Kitchen lisinopril (ZESTRIL) 5 MG tablet Take 1 tablet (5 mg total) by mouth daily.  . mupirocin ointment (BACTROBAN) 2 % Apply to crusts on the left lower leg QAM.  . omeprazole (PRILOSEC) 20 MG capsule Take 1 capsule (20 mg total) by mouth daily.  Marland Kitchen triamcinolone ointment (KENALOG) 0.1 % Apply to left  lower leg QHS as directed.    PHQ 2/9 Scores 09/15/2020 03/09/2020 09/16/2019 09/15/2019  PHQ - 2 Score 0 0 0 0  PHQ- 9 Score - 0 0 -    GAD 7 : Generalized Anxiety Score 03/09/2020 09/16/2019  Nervous, Anxious, on Edge 0 0  Control/stop worrying 0 0  Worry too much - different things 0 0  Trouble relaxing 0 0  Restless 0 0  Easily annoyed or irritable 0 0  Afraid - awful might happen 0 0  Total GAD 7 Score 0 0  Anxiety Difficulty Not difficult at all -    BP Readings from Last 3 Encounters:  10/28/20 120/70  09/15/20 (!) 144/78  09/15/20 (!) 144/78    Physical Exam Vitals and nursing note reviewed.  HENT:     Head: Normocephalic.     Right Ear: Tympanic membrane, ear canal and external ear normal.     Left Ear: Tympanic membrane, ear canal and external ear normal.     Nose: Nose normal. No congestion or rhinorrhea.  Eyes:     General: No scleral icterus.       Right  eye: No discharge.        Left eye: No discharge.     Conjunctiva/sclera: Conjunctivae normal.     Pupils: Pupils are equal, round, and reactive to light.  Neck:     Thyroid: No thyromegaly.     Vascular: No JVD.     Trachea: No tracheal deviation.  Cardiovascular:     Rate and Rhythm: Normal rate and regular rhythm.     Heart sounds: Normal heart sounds. No murmur heard. No friction rub. No gallop.   Pulmonary:     Effort: No respiratory distress.     Breath sounds: Normal breath sounds. No wheezing or rales.  Abdominal:     General: Bowel sounds are normal.     Palpations: Abdomen is soft. There is no mass.     Tenderness: There is no abdominal tenderness. There is no guarding or rebound.  Musculoskeletal:        General: No tenderness. Normal range of motion.     Cervical back: Normal range of motion and neck supple.  Lymphadenopathy:     Cervical: No cervical adenopathy.  Skin:    General: Skin is warm.     Findings: No rash.  Neurological:     Mental Status: He is alert and oriented to person,  place, and time.     Cranial Nerves: No cranial nerve deficit.     Deep Tendon Reflexes: Reflexes are normal and symmetric.     Wt Readings from Last 3 Encounters:  10/28/20 181 lb (82.1 kg)  09/15/20 182 lb (82.6 kg)  09/15/20 182 lb (82.6 kg)    BP 120/70   Pulse 64   Ht 5\' 9"  (1.753 m)   Wt 181 lb (82.1 kg)   BMI 26.73 kg/m   Assessment and Plan: 1. Essential hypertension Chronic.  Controlled.  Stable.  Patient's blood pressure today is 120/70.  We will continue his lisinopril 5 mg and amlodipine 10 mg once a day. - lisinopril (ZESTRIL) 5 MG tablet; Take 1 tablet (5 mg total) by mouth daily.  Dispense: 90 tablet; Refill: 1 - amLODipine (NORVASC) 10 MG tablet; Take 1 tablet (10 mg total) by mouth daily.  Dispense: 90 tablet; Refill: 1  2. Familial hypercholesterolemia Chronic.  Uncontrolled.  Patient was prescribed rosuvastatin by cardiology but he did not take it thinking that the lisinopril he was taking was for cholesterol.  We have explained the problem in his medical regimen and he has rosuvastatin in possession and he will initiate this.  3. Gastroesophageal reflux disease, unspecified whether esophagitis present Chronic.  Controlled.  Stable.  Continue omeprazole 20 mg once a day. - omeprazole (PRILOSEC) 20 MG capsule; Take 1 capsule (20 mg total) by mouth daily.  Dispense: 90 capsule; Refill: 1  4. Compliance with medication regimen Patient sometimes does not understand the instructions and the necessity of medications and he missed his rosuvastatin as something that he did not need to take prior or on a as needed basis and he thought that his lisinopril was for cholesterol we have explained the discrepancy in this and patient will resume in his possession the rosuvastatin and will recheck in couple weeks to see if lipid management has improved.

## 2021-03-02 ENCOUNTER — Encounter: Payer: Self-pay | Admitting: Family Medicine

## 2021-03-02 ENCOUNTER — Ambulatory Visit (INDEPENDENT_AMBULATORY_CARE_PROVIDER_SITE_OTHER): Payer: Medicare HMO | Admitting: Family Medicine

## 2021-03-02 ENCOUNTER — Other Ambulatory Visit: Payer: Self-pay

## 2021-03-02 VITALS — BP 138/68 | HR 60 | Ht 69.0 in | Wt 180.0 lb

## 2021-03-02 DIAGNOSIS — K219 Gastro-esophageal reflux disease without esophagitis: Secondary | ICD-10-CM

## 2021-03-02 DIAGNOSIS — I10 Essential (primary) hypertension: Secondary | ICD-10-CM

## 2021-03-02 MED ORDER — AMLODIPINE BESYLATE 10 MG PO TABS: 10.0000 mg | ORAL_TABLET | Freq: Every day | ORAL | 1 refills | Status: DC

## 2021-03-02 MED ORDER — OMEPRAZOLE 20 MG PO CPDR
20.0000 mg | DELAYED_RELEASE_CAPSULE | Freq: Every day | ORAL | 1 refills | Status: DC
Start: 2021-03-02 — End: 2021-09-02

## 2021-03-02 NOTE — Progress Notes (Signed)
Date:  03/02/2021   Name:  Marco Sims   DOB:  [birth year]   MRN:  [id number]   Chief Complaint: Hypertension and Gastroesophageal Reflux  Hypertension This is a chronic problem. The current episode started more than 1 year ago. The problem has been gradually improving since onset. The problem is controlled. Pertinent negatives include no anxiety, blurred vision, chest pain, headaches, malaise/fatigue, neck pain, orthopnea, palpitations, peripheral edema, PND, shortness of breath or sweats. There are no associated agents to hypertension. Risk factors for coronary artery disease include dyslipidemia. Past treatments include calcium channel blockers. The current treatment provides moderate improvement. There are no compliance problems.  There is no history of angina, kidney disease, CAD/MI, CVA, heart failure, left ventricular hypertrophy, PVD or retinopathy. There is no history of chronic renal disease, a hypertension causing med or renovascular disease.  Gastroesophageal Reflux He reports no abdominal pain, no chest pain, no choking, no coughing, no dysphagia, no nausea, no sore throat or no wheezing. This is a chronic problem. The current episode started more than 1 year ago. The problem has been gradually improving. Nothing aggravates the symptoms. Pertinent negatives include no muscle weakness. There are no known risk factors. He has tried a PPI for the symptoms. The treatment provided moderate relief.   Lab Results  Component Value Date   CREATININE 0.98 09/15/2020   BUN 11 09/15/2020   NA 145 (H) 09/15/2020   K 4.3 09/15/2020   CL 105 09/15/2020   CO2 23 09/15/2020   Lab Results  Component Value Date   CHOL 235 (H) 09/15/2020   HDL 50 09/15/2020   LDLCALC 160 (H) 09/15/2020   LDLDIRECT 168.9 01/10/2011   TRIG 139 09/15/2020   CHOLHDL 5.6 (H) 01/09/2018   Lab Results  Component Value Date   TSH 2.62 01/10/2011   No results found for: HGBA1C Lab Results  Component Value  Date   WBC 5.0 09/26/2019   HGB 12.1 (L) 09/26/2019   HCT 37.7 (L) 09/26/2019   MCV 79.7 (L) 09/26/2019   PLT 228 09/26/2019   Lab Results  Component Value Date   ALT 27 06/14/2018   AST 27 06/14/2018   ALKPHOS 87 06/14/2018   BILITOT 1.1 06/14/2018     Review of Systems  Constitutional:  Negative for chills, fever and malaise/fatigue.  HENT:  Negative for drooling, ear discharge, ear pain and sore throat.   Eyes:  Negative for blurred vision.  Respiratory:  Negative for cough, choking, shortness of breath and wheezing.   Cardiovascular:  Negative for chest pain, palpitations, orthopnea, leg swelling and PND.  Gastrointestinal:  Negative for abdominal pain, blood in stool, constipation, diarrhea, dysphagia and nausea.  Endocrine: Negative for polydipsia.  Genitourinary:  Negative for dysuria, frequency, hematuria and urgency.  Musculoskeletal:  Negative for back pain, myalgias, muscle weakness and neck pain.  Skin:  Negative for rash.  Allergic/Immunologic: Negative for environmental allergies.  Neurological:  Negative for dizziness and headaches.  Hematological:  Does not bruise/bleed easily.  Psychiatric/Behavioral:  Negative for suicidal ideas. The patient is not nervous/anxious.    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   Hematochezia    Benign neoplasm of ascending colon    Essential hypertension 01/09/2018   GERD 11/04/2009   ERECTILE DYSFUNCTION 07/28/2008   DERMATITIS 07/28/2008   LABYRINTHITIS 11/05/2007   HYPERLIPIDEMIA 09/24/2007   ANEMIA 09/24/2007   CONDUCTIVE HEARING LOSS UNILATERAL 09/24/2007   ARTHRITIS 09/24/2007  UNS ADVRS EFF OTH RX MEDICINAL&BIOLOGICAL SBSTNC 09/24/2007    Allergies  Allergen Reactions   Lisinopril Cough    Past Surgical History:  Procedure Laterality Date   AMPUTATION Left 09/26/2019   Procedure: LEFT LITTLE FINGER TIP, AMPUTATION;  Surgeon: Hessie Knows, MD;  Location: [facility];   Service: Orthopedics;  Laterality: Left;   Colles' fracture right 1956     COLONOSCOPY  2013   COLONOSCOPY WITH PROPOFOL N/A 07/11/2018   Procedure: COLONOSCOPY WITH PROPOFOL;  Surgeon: Lucilla Lame, MD;  Location: [facility];  Service: Endoscopy;  Laterality: N/A;   FRACTURE SURGERY     fractured left arm radius and  ulna 1955     fractured right tibiofibular 2009     HAND SURGERY  06/02/11   HERNIA REPAIR Right 2010, 2015   HERNIA REPAIR Left 2015   herniorpahy, rt  05/09/2009   NERVE REPAIR Right 08/12/2015   Procedure: EXPLORATION REPAIR  NERVE, DEBRIDEMENT FRACTURE RIGHT INDEX MIDDLE FINGER;  Surgeon: Daryll Brod, MD;  Location: [facility];  Service: Orthopedics;  Laterality: Right;   POLYPECTOMY  07/11/2018   Procedure: POLYPECTOMY;  Surgeon: Lucilla Lame, MD;  Location: [facility];  Service: Endoscopy;;   ruptured left Achilles tendon 1980     surgery for cubital tunnel syndrome and left arm 1999     tonsillectomy 1942      Social History   Tobacco Use   Smoking status: Never   Smokeless tobacco: Never   Tobacco comments:    smoking cessation materials not required  Vaping Use   Vaping Use: Never used  Substance Use Topics   Alcohol use: Yes    Alcohol/week: 0.0 standard drinks    Comment: rare; 1 to 2 beers a month   Drug use: No     Medication list has been reviewed and updated.  Current Meds  Medication Sig   amLODipine (NORVASC) 10 MG tablet Take 1 tablet (10 mg total) by mouth daily.   mupirocin ointment (BACTROBAN) 2 % Apply to crusts on the left lower leg QAM.   omeprazole (PRILOSEC) 20 MG capsule Take 1 capsule (20 mg total) by mouth daily.   rosuvastatin (CRESTOR) 10 MG tablet Take 10 mg by mouth daily. Cardiology Dr   triamcinolone ointment (KENALOG) 0.1 % Apply to left lower leg QHS as directed.    PHQ 2/9 Scores 03/02/2021 09/15/2020 03/09/2020 09/16/2019  PHQ - 2 Score 0 0 0 0  PHQ- 9 Score 0 - 0 0    GAD 7 : Generalized  Anxiety Score 03/02/2021 03/09/2020 09/16/2019  Nervous, Anxious, on Edge 0 0 0  Control/stop worrying 0 0 0  Worry too much - different things 0 0 0  Trouble relaxing 0 0 0  Restless 0 0 0  Easily annoyed or irritable 0 0 0  Afraid - awful might happen 0 0 0  Total GAD 7 Score 0 0 0  Anxiety Difficulty - Not difficult at all -    BP Readings from Last 3 Encounters:  03/02/21 138/68  10/28/20 120/70  09/15/20 (!) 144/78    Physical Exam Vitals and nursing note reviewed.  HENT:     Head: Normocephalic.     Right Ear: External ear normal.     Left Ear: External ear normal.     Nose: Nose normal.  Eyes:     General: No scleral icterus.       Right eye: No discharge.  Left eye: No discharge.     Conjunctiva/sclera: Conjunctivae normal.     Pupils: Pupils are equal, round, and reactive to light.  Neck:     Thyroid: No thyromegaly.     Vascular: No JVD.     Trachea: No tracheal deviation.  Cardiovascular:     Rate and Rhythm: Normal rate and regular rhythm.     Heart sounds: Normal heart sounds. No murmur heard.   No friction rub. No gallop.  Pulmonary:     Effort: No respiratory distress.     Breath sounds: Normal breath sounds. No wheezing or rales.  Abdominal:     General: Bowel sounds are normal.     Palpations: Abdomen is soft. There is no mass.     Tenderness: There is no abdominal tenderness. There is no guarding or rebound.  Musculoskeletal:        General: No tenderness. Normal range of motion.     Cervical back: Normal range of motion and neck supple.  Lymphadenopathy:     Cervical: No cervical adenopathy.  Skin:    General: Skin is warm.     Findings: No rash.  Neurological:     Mental Status: He is alert.     Cranial Nerves: No cranial nerve deficit.    Wt Readings from Last 3 Encounters:  03/02/21 180 lb (81.6 kg)  10/28/20 181 lb (82.1 kg)  09/15/20 182 lb (82.6 kg)    BP 138/68   Pulse 60   Ht '5\' 9"'$  (1.753 m)   Wt 180 lb (81.6 kg)   BMI  26.58 kg/m   Assessment and Plan:  1. Essential hypertension Chronic.  Controlled.  Stable.  Blood pressure today is 138/68.  Continue amlodipine 10 mg once a day.  Patient is tolerating absence of lisinopril but he stopped due to cough.  Cough has resolved and blood pressure is currently acceptable - amLODipine (NORVASC) 10 MG tablet; Take 1 tablet (10 mg total) by mouth daily.  Dispense: 90 tablet; Refill: 1  2. Gastroesophageal reflux disease, unspecified whether esophagitis present Chronic.  Controlled.  Stable.  Continue omeprazole 20 mg once a day.  We will recheck patient for this and above in 6 months. - omeprazole (PRILOSEC) 20 MG capsule; Take 1 capsule (20 mg total) by mouth daily.  Dispense: 90 capsule; Refill: 1

## 2021-05-02 DIAGNOSIS — R109 Unspecified abdominal pain: Secondary | ICD-10-CM | POA: Diagnosis not present

## 2021-05-02 DIAGNOSIS — K219 Gastro-esophageal reflux disease without esophagitis: Secondary | ICD-10-CM | POA: Diagnosis not present

## 2021-05-02 DIAGNOSIS — I10 Essential (primary) hypertension: Secondary | ICD-10-CM | POA: Diagnosis not present

## 2021-05-02 DIAGNOSIS — R2681 Unsteadiness on feet: Secondary | ICD-10-CM | POA: Diagnosis not present

## 2021-05-02 DIAGNOSIS — E7849 Other hyperlipidemia: Secondary | ICD-10-CM | POA: Diagnosis not present

## 2021-05-02 DIAGNOSIS — I2510 Atherosclerotic heart disease of native coronary artery without angina pectoris: Secondary | ICD-10-CM | POA: Diagnosis not present

## 2021-09-02 ENCOUNTER — Ambulatory Visit (INDEPENDENT_AMBULATORY_CARE_PROVIDER_SITE_OTHER): Payer: Medicare HMO | Admitting: Family Medicine

## 2021-09-02 ENCOUNTER — Other Ambulatory Visit: Payer: Self-pay

## 2021-09-02 ENCOUNTER — Encounter: Payer: Self-pay | Admitting: Family Medicine

## 2021-09-02 VITALS — BP 138/62 | HR 72 | Ht 69.0 in | Wt 185.0 lb

## 2021-09-02 DIAGNOSIS — I10 Essential (primary) hypertension: Secondary | ICD-10-CM | POA: Diagnosis not present

## 2021-09-02 DIAGNOSIS — E7801 Familial hypercholesterolemia: Secondary | ICD-10-CM

## 2021-09-02 DIAGNOSIS — K219 Gastro-esophageal reflux disease without esophagitis: Secondary | ICD-10-CM | POA: Diagnosis not present

## 2021-09-02 MED ORDER — AMLODIPINE BESYLATE 10 MG PO TABS
10.0000 mg | ORAL_TABLET | Freq: Every day | ORAL | 1 refills | Status: DC
Start: 2021-09-02 — End: 2022-03-02

## 2021-09-02 MED ORDER — OMEPRAZOLE 20 MG PO CPDR: 20.0000 mg | DELAYED_RELEASE_CAPSULE | Freq: Every day | ORAL | 1 refills | Status: DC

## 2021-09-02 NOTE — Progress Notes (Signed)
Date:  09/02/2021   Name:  Marco Sims   DOB:  1934-02-06   MRN:  [id number]   Chief Complaint: Gastroesophageal Reflux, Hyperlipidemia, and Hypertension  Gastroesophageal Reflux He reports no abdominal pain, no belching, no chest pain, no choking, no coughing, no dysphagia, no heartburn, no hoarse voice, no nausea, no sore throat or no wheezing. This is a chronic problem. The current episode started more than 1 year ago. The problem occurs rarely. The problem has been gradually improving. The symptoms are aggravated by certain foods. Pertinent negatives include no anemia, fatigue, melena, muscle weakness, orthopnea or weight loss. There are no known risk factors. He has tried a PPI for the symptoms. The treatment provided moderate relief.  Hyperlipidemia This is a chronic problem. The current episode started more than 1 year ago. The problem is controlled. Recent lipid tests were reviewed and are normal. He has no history of chronic renal disease, diabetes, hypothyroidism, liver disease, obesity or nephrotic syndrome. Pertinent negatives include no chest pain, focal sensory loss, focal weakness, leg pain, myalgias or shortness of breath. Current antihyperlipidemic treatment includes statins. The current treatment provides moderate improvement of lipids. There are no compliance problems.  Risk factors for coronary artery disease include hypertension.  Hypertension This is a chronic problem. The current episode started more than 1 year ago. The problem has been gradually improving since onset. The problem is controlled. Pertinent negatives include no anxiety, blurred vision, chest pain, headaches, malaise/fatigue, neck pain, orthopnea, palpitations, peripheral edema, PND or shortness of breath. Past treatments include calcium channel blockers. There is no history of angina, kidney disease, CAD/MI, CVA, heart failure, left ventricular hypertrophy, PVD or retinopathy. There is no history of chronic  renal disease, a hypertension causing med or renovascular disease.   Lab Results  Component Value Date   NA 145 (H) 09/15/2020   K 4.3 09/15/2020   CO2 23 09/15/2020   GLUCOSE 109 (H) 09/15/2020   BUN 11 09/15/2020   CREATININE 0.98 09/15/2020   CALCIUM 9.2 09/15/2020   GFRNONAA 71 09/15/2020   Lab Results  Component Value Date   CHOL 235 (H) 09/15/2020   HDL 50 09/15/2020   LDLCALC 160 (H) 09/15/2020   LDLDIRECT 168.9 01/10/2011   TRIG 139 09/15/2020   CHOLHDL 5.6 (H) 01/09/2018   Lab Results  Component Value Date   TSH 2.62 01/10/2011   No results found for: HGBA1C Lab Results  Component Value Date   WBC 5.0 09/26/2019   HGB 12.1 (L) 09/26/2019   HCT 37.7 (L) 09/26/2019   MCV 79.7 (L) 09/26/2019   PLT 228 09/26/2019   Lab Results  Component Value Date   ALT 27 06/14/2018   AST 27 06/14/2018   ALKPHOS 87 06/14/2018   BILITOT 1.1 06/14/2018   Lab Results  Component Value Date   VD25OH 38 01/24/2011     Review of Systems  Constitutional:  Negative for chills, fatigue, fever, malaise/fatigue and weight loss.  HENT:  Negative for drooling, ear discharge, ear pain, hoarse voice and sore throat.   Eyes:  Negative for blurred vision.  Respiratory:  Negative for cough, choking, shortness of breath and wheezing.   Cardiovascular:  Negative for chest pain, palpitations, orthopnea, leg swelling and PND.  Gastrointestinal:  Negative for abdominal pain, blood in stool, constipation, diarrhea, dysphagia, heartburn, melena and nausea.  Endocrine: Negative for polydipsia.  Genitourinary:  Negative for dysuria, frequency, hematuria and urgency.  Musculoskeletal:  Negative for back pain, myalgias, muscle  weakness and neck pain.  Skin:  Negative for rash.  Allergic/Immunologic: Negative for environmental allergies.  Neurological:  Negative for dizziness, focal weakness and headaches.  Hematological:  Does not bruise/bleed easily.  Psychiatric/Behavioral:  Negative for  suicidal ideas. The patient is not nervous/anxious.    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   Hematochezia    Benign neoplasm of ascending colon    Essential hypertension 01/09/2018   GERD 11/04/2009   ERECTILE DYSFUNCTION 07/28/2008   DERMATITIS 07/28/2008   LABYRINTHITIS 11/05/2007   HYPERLIPIDEMIA 09/24/2007   ANEMIA 09/24/2007   CONDUCTIVE HEARING LOSS UNILATERAL 09/24/2007   ARTHRITIS 09/24/2007   UNS ADVRS EFF OTH RX MEDICINAL&BIOLOGICAL SBSTNC 09/24/2007    Allergies  Allergen Reactions   Lisinopril Cough    Past Surgical History:  Procedure Laterality Date   AMPUTATION Left 09/26/2019   Procedure: LEFT LITTLE FINGER TIP, AMPUTATION;  Surgeon: Hessie Knows, MD;  Location: [facility];  Service: Orthopedics;  Laterality: Left;   Colles' fracture right 1956     COLONOSCOPY  2013   COLONOSCOPY WITH PROPOFOL N/A 07/11/2018   Procedure: COLONOSCOPY WITH PROPOFOL;  Surgeon: Lucilla Lame, MD;  Location: [facility];  Service: Endoscopy;  Laterality: N/A;   FRACTURE SURGERY     fractured left arm radius and  ulna 1955     fractured right tibiofibular 2009     HAND SURGERY  06/02/11   HERNIA REPAIR Right 2010, 2015   HERNIA REPAIR Left 2015   herniorpahy, rt  05/09/2009   NERVE REPAIR Right 08/12/2015   Procedure: EXPLORATION REPAIR  NERVE, DEBRIDEMENT FRACTURE RIGHT INDEX MIDDLE FINGER;  Surgeon: Daryll Brod, MD;  Location: [facility];  Service: Orthopedics;  Laterality: Right;   POLYPECTOMY  07/11/2018   Procedure: POLYPECTOMY;  Surgeon: Lucilla Lame, MD;  Location: [facility];  Service: Endoscopy;;   ruptured left Achilles tendon 1980     surgery for cubital tunnel syndrome and left arm 1999     tonsillectomy 1942      Social History   Tobacco Use   Smoking status: Never   Smokeless tobacco: Never   Tobacco comments:    smoking cessation materials not required  Vaping Use   Vaping Use: Never  used  Substance Use Topics   Alcohol use: Yes    Alcohol/week: 0.0 standard drinks    Comment: rare; 1 to 2 beers a month   Drug use: No     Medication list has been reviewed and updated.  Current Meds  Medication Sig   amLODipine (NORVASC) 10 MG tablet Take 1 tablet (10 mg total) by mouth daily.   mupirocin ointment (BACTROBAN) 2 % Apply to crusts on the left lower leg QAM.   omeprazole (PRILOSEC) 20 MG capsule Take 1 capsule (20 mg total) by mouth daily.   rosuvastatin (CRESTOR) 10 MG tablet Take 10 mg by mouth daily. Cardiology Dr   triamcinolone ointment (KENALOG) 0.1 % Apply to left lower leg QHS as directed.    PHQ 2/9 Scores 09/02/2021 03/02/2021 09/15/2020 03/09/2020  PHQ - 2 Score 0 0 0 0  PHQ- 9 Score 0 0 - 0    GAD 7 : Generalized Anxiety Score 09/02/2021 03/02/2021 03/09/2020 09/16/2019  Nervous, Anxious, on Edge 0 0 0 0  Control/stop worrying 0 0 0 0  Worry too much - different things 0 0 0 0  Trouble relaxing 0 0 0 0  Restless 0 0  0 0  Easily annoyed or irritable 0 0 0 0  Afraid - awful might happen 0 0 0 0  Total GAD 7 Score 0 0 0 0  Anxiety Difficulty Not difficult at all - Not difficult at all -    BP Readings from Last 3 Encounters:  09/02/21 138/62  03/02/21 138/68  10/28/20 120/70    Physical Exam Vitals reviewed.  HENT:     Head: Normocephalic.     Right Ear: Tympanic membrane and external ear normal. Decreased hearing noted. There is impacted cerumen.     Left Ear: Tympanic membrane and external ear normal. Decreased hearing noted. There is no impacted cerumen.     Nose: Nose normal.     Right Turbinates: Not swollen.     Left Turbinates: Not swollen.     Mouth/Throat:     Lips: Pink.     Mouth: Mucous membranes are moist.     Palate: No mass.  Eyes:     General: Lids are normal. Vision grossly intact. No scleral icterus.       Right eye: No discharge.        Left eye: No discharge.     Conjunctiva/sclera: Conjunctivae normal.     Pupils: Pupils  are equal, round, and reactive to light.  Neck:     Thyroid: No thyroid mass, thyromegaly or thyroid tenderness.     Vascular: No JVD.     Trachea: No tracheal deviation.  Cardiovascular:     Rate and Rhythm: Normal rate and regular rhythm.     Heart sounds: Normal heart sounds, S1 normal and S2 normal. No murmur heard. No systolic murmur is present.  No diastolic murmur is present.    No friction rub. No gallop. No S3 or S4 sounds.  Pulmonary:     Effort: No respiratory distress.     Breath sounds: Normal breath sounds. No decreased breath sounds, wheezing, rhonchi or rales.  Abdominal:     General: Bowel sounds are normal.     Palpations: Abdomen is soft. There is no hepatomegaly, splenomegaly or mass.     Tenderness: There is no abdominal tenderness. There is no guarding or rebound.  Musculoskeletal:        General: No tenderness. Normal range of motion.     Cervical back: Normal range of motion and neck supple.  Lymphadenopathy:     Cervical: No cervical adenopathy.  Skin:    General: Skin is warm.     Findings: No rash.  Neurological:     Mental Status: He is alert and oriented to person, place, and time.     Cranial Nerves: No cranial nerve deficit.     Deep Tendon Reflexes: Reflexes are normal and symmetric.    Wt Readings from Last 3 Encounters:  09/02/21 185 lb (83.9 kg)  03/02/21 180 lb (81.6 kg)  10/28/20 181 lb (82.1 kg)    BP 138/62    Pulse 72    Ht 5\' 9"  (1.753 m)    Wt 185 lb (83.9 kg)    BMI 27.32 kg/m   Assessment and Plan:  1. Essential hypertension Chronic.  Controlled.  Stable.  Blood pressure today is 138/62.  Continue amlodipine 10 mg once a day.  Will check renal function panel for electrolytes and GFR.  We will recheck patient in 6 months - amLODipine (NORVASC) 10 MG tablet; Take 1 tablet (10 mg total) by mouth daily.  Dispense: 90 tablet; Refill: 1 - Renal Function  Panel  2. Gastroesophageal reflux disease, unspecified whether esophagitis  present Chronic.  Controlled.  Stable.  Patient is asymptomatic except for when certain foods like barbecue spareribs are eaten.  Otherwise he is controlled on omeprazole 20 mg once a day. - omeprazole (PRILOSEC) 20 MG capsule; Take 1 capsule (20 mg total) by mouth daily.  Dispense: 90 capsule; Refill: 1  3. Familial hypercholesterolemia Followed by Dr. Ubaldo Glassing.  Will check lipid panel at this time and patient will likely continue on his Crestor. - Lipid Panel With LDL/HDL Ratio

## 2021-09-03 LAB — RENAL FUNCTION PANEL
Albumin: 4.2 g/dL (ref 3.6–4.6)
BUN/Creatinine Ratio: 17 (ref 10–24)
BUN: 20 mg/dL (ref 8–27)
CO2: 23 mmol/L (ref 20–29)
Calcium: 9 mg/dL (ref 8.6–10.2)
Chloride: 108 mmol/L — ABNORMAL HIGH (ref 96–106)
Creatinine, Ser: 1.15 mg/dL (ref 0.76–1.27)
Glucose: 123 mg/dL — ABNORMAL HIGH (ref 70–99)
Phosphorus: 2.8 mg/dL (ref 2.8–4.1)
Potassium: 4.4 mmol/L (ref 3.5–5.2)
Sodium: 143 mmol/L (ref 134–144)
eGFR: 62 mL/min/{1.73_m2} (ref >59)

## 2021-09-03 LAB — LIPID PANEL WITH LDL/HDL RATIO
Cholesterol, Total: 160 mg/dL (ref 100–199)
HDL: 44 mg/dL (ref >39)
LDL Chol Calc (NIH): 98 mg/dL (ref 0–99)
LDL/HDL Ratio: 2.2 ratio (ref 0.0–3.6)
Triglycerides: 98 mg/dL (ref 0–149)
VLDL Cholesterol Cal: 18 mg/dL (ref 5–40)

## 2021-09-19 ENCOUNTER — Ambulatory Visit (INDEPENDENT_AMBULATORY_CARE_PROVIDER_SITE_OTHER): Payer: Medicare HMO

## 2021-09-19 ENCOUNTER — Other Ambulatory Visit: Payer: Self-pay

## 2021-09-19 VITALS — BP 132/72 | HR 60 | Temp 97.6°F | Resp 16 | Ht 69.0 in | Wt 188.4 lb

## 2021-09-19 DIAGNOSIS — Z0000 Encounter for general adult medical examination without abnormal findings: Secondary | ICD-10-CM | POA: Diagnosis not present

## 2021-09-19 NOTE — Patient Instructions (Signed)
Marco Sims , Thank you for taking time to come for your Medicare Wellness Visit. I appreciate your ongoing commitment to your health goals. Please review the following plan we discussed and let me know if I can assist you in the future.   Screening recommendations/referrals: Colonoscopy: no longer required Recommended yearly ophthalmology/optometry visit for glaucoma screening and checkup Recommended yearly dental visit for hygiene and checkup  Vaccinations: Influenza vaccine: declined Pneumococcal vaccine: done 05/24/19 Tdap vaccine: due  Shingles vaccine: done 05/24/19 & 10/02/19   Covid-19: done 11/05/19 & 11/26/19  Advanced directives: Advance directive discussed with you today. I have provided a copy for you to complete at home and have notarized. Once this is complete please bring a copy in to our office so we can scan it into your chart.   Conditions/risks identified: Recommend fall prevention in the home  Next appointment: Follow up in one year for your annual wellness visit.   Preventive Care 74 Years and Older, Male Preventive care refers to lifestyle choices and visits with your health care provider that can promote health and wellness. What does preventive care include? A yearly physical exam. This is also called an annual well check. Dental exams once or twice a year. Routine eye exams. Ask your health care provider how often you should have your eyes checked. Personal lifestyle choices, including: Daily care of your teeth and gums. Regular physical activity. Eating a healthy diet. Avoiding tobacco and drug use. Limiting alcohol use. Practicing safe sex. Taking low doses of aspirin every day. Taking vitamin and mineral supplements as recommended by your health care provider. What happens during an annual well check? The services and screenings done by your health care provider during your annual well check will depend on your age, overall health, lifestyle risk factors,  and family history of disease. Counseling  Your health care provider may ask you questions about your: Alcohol use. Tobacco use. Drug use. Emotional well-being. Home and relationship well-being. Sexual activity. Eating habits. History of falls. Memory and ability to understand (cognition). Work and work Statistician. Screening  You may have the following tests or measurements: Height, weight, and BMI. Blood pressure. Lipid and cholesterol levels. These may be checked every 5 years, or more frequently if you are over 1 years old. Skin check. Lung cancer screening. You may have this screening every year starting at age 87 if you have a 30-pack-year history of smoking and currently smoke or have quit within the past 15 years. Fecal occult blood test (FOBT) of the stool. You may have this test every year starting at age 37. Flexible sigmoidoscopy or colonoscopy. You may have a sigmoidoscopy every 5 years or a colonoscopy every 10 years starting at age 63. Prostate cancer screening. Recommendations will vary depending on your family history and other risks. Hepatitis C blood test. Hepatitis B blood test. Sexually transmitted disease (STD) testing. Diabetes screening. This is done by checking your blood sugar (glucose) after you have not eaten for a while (fasting). You may have this done every 1-3 years. Abdominal aortic aneurysm (AAA) screening. You may need this if you are a current or former smoker. Osteoporosis. You may be screened starting at age 75 if you are at high risk. Talk with your health care provider about your test results, treatment options, and if necessary, the need for more tests. Vaccines  Your health care provider may recommend certain vaccines, such as: Influenza vaccine. This is recommended every year. Tetanus, diphtheria, and acellular pertussis (Tdap,  Td) vaccine. You may need a Td booster every 10 years. Zoster vaccine. You may need this after age  13. Pneumococcal 13-valent conjugate (PCV13) vaccine. One dose is recommended after age 58. Pneumococcal polysaccharide (PPSV23) vaccine. One dose is recommended after age 56. Talk to your health care provider about which screenings and vaccines you need and how often you need them. This information is not intended to replace advice given to you by your health care provider. Make sure you discuss any questions you have with your health care provider. Document Released: 08/20/2015 Document Revised: 04/12/2016 Document Reviewed: 05/25/2015 Elsevier Interactive Patient Education  2017 [place] Prevention in the Home Falls can cause injuries. They can happen to people of all ages. There are many things you can do to make your home safe and to help prevent falls. What can I do on the outside of my home? Regularly fix the edges of walkways and driveways and fix any cracks. Remove anything that might make you trip as you walk through a door, such as a raised step or threshold. Trim any bushes or trees on the path to your home. Use bright outdoor lighting. Clear any walking paths of anything that might make someone trip, such as rocks or tools. Regularly check to see if handrails are loose or broken. Make sure that both sides of any steps have handrails. Any raised decks and porches should have guardrails on the edges. Have any leaves, snow, or ice cleared regularly. Use sand or salt on walking paths during winter. Clean up any spills in your garage right away. This includes oil or grease spills. What can I do in the bathroom? Use night lights. Install grab bars by the toilet and in the tub and shower. Do not use towel bars as grab bars. Use non-skid mats or decals in the tub or shower. If you need to sit down in the shower, use a plastic, non-slip stool. Keep the floor dry. Clean up any water that spills on the floor as soon as it happens. Remove soap buildup in the tub or shower  regularly. Attach bath mats securely with double-sided non-slip rug tape. Do not have throw rugs and other things on the floor that can make you trip. What can I do in the bedroom? Use night lights. Make sure that you have a light by your bed that is easy to reach. Do not use any sheets or blankets that are too big for your bed. They should not hang down onto the floor. Have a firm chair that has side arms. You can use this for support while you get dressed. Do not have throw rugs and other things on the floor that can make you trip. What can I do in the kitchen? Clean up any spills right away. Avoid walking on wet floors. Keep items that you use a lot in easy-to-reach places. If you need to reach something above you, use a strong step stool that has a grab bar. Keep electrical cords out of the way. Do not use floor polish or wax that makes floors slippery. If you must use wax, use non-skid floor wax. Do not have throw rugs and other things on the floor that can make you trip. What can I do with my stairs? Do not leave any items on the stairs. Make sure that there are handrails on both sides of the stairs and use them. Fix handrails that are broken or loose. Make sure that handrails are as  long as the stairways. Check any carpeting to make sure that it is firmly attached to the stairs. Fix any carpet that is loose or worn. Avoid having throw rugs at the top or bottom of the stairs. If you do have throw rugs, attach them to the floor with carpet tape. Make sure that you have a light switch at the top of the stairs and the bottom of the stairs. If you do not have them, ask someone to add them for you. What else can I do to help prevent falls? Wear shoes that: Do not have high heels. Have rubber bottoms. Are comfortable and fit you well. Are closed at the toe. Do not wear sandals. If you use a stepladder: Make sure that it is fully opened. Do not climb a closed stepladder. Make sure that  both sides of the stepladder are locked into place. Ask someone to hold it for you, if possible. Clearly mark and make sure that you can see: Any grab bars or handrails. First and last steps. Where the edge of each step is. Use tools that help you move around (mobility aids) if they are needed. These include: Canes. Walkers. Scooters. Crutches. Turn on the lights when you go into a dark area. Replace any light bulbs as soon as they burn out. Set up your furniture so you have a clear path. Avoid moving your furniture around. If any of your floors are uneven, fix them. If there are any pets around you, be aware of where they are. Review your medicines with your doctor. Some medicines can make you feel dizzy. This can increase your chance of falling. Ask your doctor what other things that you can do to help prevent falls. This information is not intended to replace advice given to you by your health care provider. Make sure you discuss any questions you have with your health care provider. Document Released: 05/20/2009 Document Revised: 12/30/2015 Document Reviewed: 08/28/2014 Elsevier Interactive Patient Education  2017 [employer].

## 2021-09-19 NOTE — Progress Notes (Signed)
Subjective:   Marco Sims is a 88 y.o. male who presents for Medicare Annual/Subsequent preventive examination.  Review of Systems     Cardiac Risk Factors include: advanced age (>67men, >22 women);dyslipidemia;male gender;hypertension     Objective:    Today's Vitals   09/19/21 1033  BP: 132/72  Pulse: 60  Resp: 16  Temp: 97.6 F (36.4 C)  TempSrc: Oral  SpO2: 99%  Weight: 188 lb 6.4 oz (85.5 kg)  Height: 5\' 9"  (1.753 m)   Body mass index is 27.82 kg/m.  Advanced Directives 09/19/2021 09/15/2020 09/26/2019 09/15/2019 08/30/2019 09/30/2018 09/11/2018  Does Patient Have a Medical Advance Directive? No No No No No Yes Yes  Type of Advance Directive - - - - - - -  Does patient want to make changes to medical advance directive? - - - - - No - Patient declined Yes (MAU/Ambulatory/Procedural Areas - Information given)  Would patient like information on creating a medical advance directive? Yes (MAU/Ambulatory/Procedural Areas - Information given) Yes (MAU/Ambulatory/Procedural Areas - Information given) No - Patient declined Yes (MAU/Ambulatory/Procedural Areas - Information given) - - -    Current Medications (verified) Outpatient Encounter Medications as of 09/19/2021  Medication Sig   amLODipine (NORVASC) 10 MG tablet Take 1 tablet (10 mg total) by mouth daily.   mupirocin ointment (BACTROBAN) 2 % Apply to crusts on the left lower leg QAM.   omeprazole (PRILOSEC) 20 MG capsule Take 1 capsule (20 mg total) by mouth daily.   rosuvastatin (CRESTOR) 10 MG tablet Take 10 mg by mouth daily. Cardiology Dr   triamcinolone ointment (KENALOG) 0.1 % Apply to left lower leg QHS as directed.   No facility-administered encounter medications on file as of 09/19/2021.    Allergies (verified) Lisinopril   History: Past Medical History:  Diagnosis Date   Allergy    seaasonal   Anemia    Colon polyp    GERD (gastroesophageal reflux disease)    Hypertension    Lichen simplex chronicus     Wears hearing aid in both ears    Past Surgical History:  Procedure Laterality Date   AMPUTATION Left 09/26/2019   Procedure: LEFT LITTLE FINGER TIP, AMPUTATION;  Surgeon: Hessie Knows, MD;  Location: [facility];  Service: Orthopedics;  Laterality: Left;   Colles' fracture right 1956     COLONOSCOPY  2013   COLONOSCOPY WITH PROPOFOL N/A 07/11/2018   Procedure: COLONOSCOPY WITH PROPOFOL;  Surgeon: Lucilla Lame, MD;  Location: [place];  Service: Endoscopy;  Laterality: N/A;   FRACTURE SURGERY     fractured left arm radius and  ulna 1955     fractured right tibiofibular 2009     HAND SURGERY  06/02/11   HERNIA REPAIR Right 2010, 2015   HERNIA REPAIR Left 2015   herniorpahy, rt  05/09/2009   NERVE REPAIR Right 08/12/2015   Procedure: EXPLORATION REPAIR  NERVE, DEBRIDEMENT FRACTURE RIGHT INDEX MIDDLE FINGER;  Surgeon: Daryll Brod, MD;  Location: [place];  Service: Orthopedics;  Laterality: Right;   POLYPECTOMY  07/11/2018   Procedure: POLYPECTOMY;  Surgeon: Lucilla Lame, MD;  Location: [place];  Service: Endoscopy;;   ruptured left Achilles tendon 1980     surgery for cubital tunnel syndrome and left arm 1999     tonsillectomy 1942     Family History  Problem Relation Age of Onset   Parkinson's disease Mother    Arthritis Father    Cancer Brother    Social  History   Socioeconomic History   Marital status: Widowed    Spouse name: Not on file   Number of children: 3   Years of education: Not on file   Highest education level: Bachelor's degree (e.g., BA, AB, BS)  Occupational History   Occupation: Retired  Tobacco Use   Smoking status: Never   Smokeless tobacco: Never   Tobacco comments:    smoking cessation materials not required  Vaping Use   Vaping Use: Never used  Substance and Sexual Activity   Alcohol use: Yes    Alcohol/week: 0.0 standard drinks    Comment: rare; 1 to 2 beers a month   Drug use: No   Sexual activity: Not  Currently  Other Topics Concern   Not on file  Social History Narrative   Pt lives alone   Social Determinants of Health   Financial Resource Strain: Low Risk    Difficulty of Paying Living Expenses: Not hard at all  Food Insecurity: No Food Insecurity   Worried About Charity fundraiser in the Last Year: Never true   Arboriculturist in the Last Year: Never true  Transportation Needs: No Transportation Needs   Lack of Transportation (Medical): No   Lack of Transportation (Non-Medical): No  Physical Activity: Insufficiently Active   Days of Exercise per Week: 5 days   Minutes of Exercise per Session: 20 min  Stress: No Stress Concern Present   Feeling of Stress : Not at all  Social Connections: Moderately Isolated   Frequency of Communication with Friends and Family: More than three times a week   Frequency of Social Gatherings with Friends and Family: Twice a week   Attends Religious Services: More than 4 times per year   Active Member of [employer] or Organizations: No   Attends Archivist Meetings: Never   Marital Status: Widowed    Tobacco Counseling Counseling given: Not Answered Tobacco comments: smoking cessation materials not required   Clinical Intake:  Pre-visit preparation completed: Yes  Pain : No/denies pain     BMI - recorded: 27.82 Nutritional Status: BMI 25 -29 Overweight Nutritional Risks: None Diabetes: No  How often do you need to have someone help you when you read instructions, pamphlets, or other written materials from your doctor or pharmacy?: 1 - Never    Interpreter Needed?: No  Information entered by :: Clemetine Marker LPN   Activities of Daily Living In your present state of health, do you have any difficulty performing the following activities: 09/19/2021  Hearing? Y  Comment wears hearing aids  Vision? N  Difficulty concentrating or making decisions? N  Walking or climbing stairs? Y  Dressing or bathing? N  Doing errands,  shopping? N  Preparing Food and eating ? N  Using the Toilet? N  In the past six months, have you accidently leaked urine? N  Do you have problems with loss of bowel control? N  Managing your Medications? N  Managing your Finances? N  Housekeeping or managing your Housekeeping? N  Some recent data might be hidden    Patient Care Team: [name], MD as PCP - General (Family Medicine) Yolonda Kida, MD as Consulting Physician (Cardiology)  Indicate any recent Medical Services you may have received from other than [facility] providers in the past year (date may be approximate).     Assessment:   This is a routine wellness examination for Brendon.  Hearing/Vision screen Hearing Screening - Comments:: Hearing  aids maintained by the [facility] - Comments:: Eye exams completed at the [place] in [place].   Dietary issues and exercise activities discussed: Current Exercise Habits: Home exercise routine, Type of exercise: walking, Time (Minutes): 20, Frequency (Times/Week): 5, Weekly Exercise (Minutes/Week): 100, Intensity: Mild, Exercise limited by: orthopedic condition(s)   Goals Addressed             This Visit's Progress    DIET - INCREASE WATER INTAKE   Not on track    Recommend drinking 6-8 glasses of water per day        Depression Screen PHQ 2/9 Scores 09/19/2021 09/02/2021 03/02/2021 09/15/2020 03/09/2020 09/16/2019 09/15/2019  PHQ - 2 Score 0 0 0 0 0 0 0  PHQ- 9 Score 1 0 0 - 0 0 -    Fall Risk Fall Risk  09/19/2021 09/15/2020 03/09/2020 09/16/2019 09/15/2019  Falls in the past year? 1 1 1  0 1  Number falls in past yr: 1 1 1  - 1  Injury with Fall? 0 0 0 - 0  Risk for fall due to : History of fall(s);Impaired balance/gait History of fall(s) History of fall(s);Impaired balance/gait - History of fall(s)  Risk for fall due to: Comment - - - - -  Follow up Falls prevention discussed Falls prevention discussed Falls evaluation completed Falls evaluation completed Falls prevention  discussed    FALL RISK PREVENTION PERTAINING TO THE HOME:  Any stairs in or around the home? Yes  If so, are there any without handrails? No  Home free of loose throw rugs in walkways, pet beds, electrical cords, etc? Yes  Adequate lighting in your home to reduce risk of falls? Yes   ASSISTIVE DEVICES UTILIZED TO PREVENT FALLS:  Life alert? No  Use of a cane, walker or w/c? No  Grab bars in the bathroom? Yes  Shower chair or bench in shower? No  Elevated toilet seat or a handicapped toilet? No   TIMED UP AND GO:  Was the test performed? Yes .  Length of time to ambulate 10 feet: 6 sec.   Gait steady and fast without use of assistive device  Cognitive Function: Normal cognitive status assessed by direct observation by this Nurse Health Advisor. No abnormalities found.       6CIT Screen 09/15/2020 09/15/2019 09/11/2018 08/30/2017  What Year? 0 points 0 points 0 points 4 points  What month? 0 points 0 points 0 points 0 points  What time? 0 points 0 points 0 points 0 points  Count back from 20 0 points 0 points 0 points 0 points  Months in reverse 0 points 0 points 0 points 0 points  Repeat phrase 2 points 0 points 0 points 0 points  Total Score 2 0 0 4    Immunizations Immunization History  Administered Date(s) Administered   Influenza Split 04/26/2012, 05/24/2019   Influenza Whole 06/13/2007   Influenza, High Dose Seasonal PF 05/01/2018, 05/24/2019   Influenza,inj,Quad PF,6+ Mos 04/18/2013, 06/03/2015   Influenza-Unspecified 06/17/2012, 04/21/2013, 05/07/2013, 05/07/2014, 05/26/2014, 06/08/2015, 05/07/2016, 05/07/2017, 05/30/2017   PFIZER(Purple Top)SARS-COV-2 Vaccination 11/05/2019, 11/26/2019   Pneumococcal Conjugate-13 01/27/2015   Pneumococcal Polysaccharide-23 08/30/2017, 10/31/2017, 05/24/2019   Td 08/08/2003   Tdap 01/16/2011, 03/20/2011   Zoster Recombinat (Shingrix) 05/24/2019, 10/02/2019   Zoster, Live 06/13/2013    TDAP status: Due, Education has been  provided regarding the importance of this vaccine. Advised may receive this vaccine at local pharmacy or Health Dept. Aware to provide a copy of the vaccination  record if obtained from local pharmacy or Health Dept. Verbalized acceptance and understanding.  Flu Vaccine status: Declined, Education has been provided regarding the importance of this vaccine but patient still declined. Advised may receive this vaccine at local pharmacy or Health Dept. Aware to provide a copy of the vaccination record if obtained from local pharmacy or Health Dept. Verbalized acceptance and understanding.  Pneumococcal vaccine status: Up to date  Covid-19 vaccine status: Completed vaccines  Qualifies for Shingles Vaccine? Yes   Zostavax completed Yes   Shingrix Completed?: Yes  Screening Tests Health Maintenance  Topic Date Due   COVID-19 Vaccine (3 - Booster for Pfizer series) 01/21/2020   INFLUENZA VACCINE  11/04/2021 (Originally 03/07/2021)   TETANUS/TDAP  09/02/2022 (Originally 03/19/2021)   Pneumonia Vaccine 67+ Years old  Completed   Zoster Vaccines- Shingrix  Completed   HPV VACCINES  Aged Out    Health Maintenance  Health Maintenance Due  Topic Date Due   COVID-19 Vaccine (3 - Booster for Pfizer series) 01/21/2020    Colorectal cancer screening: No longer required.   Lung Cancer Screening: (Low Dose CT Chest recommended if Age 71-80 years, 30 pack-year currently smoking OR have quit w/in 15years.) does not qualify.   Additional Screening:  Hepatitis C Screening: does not qualify  Vision Screening: Recommended annual ophthalmology exams for early detection of glaucoma and other disorders of the eye. Is the patient up to date with their annual eye exam?  Yes  Who is the provider or what is the name of the office in which the patient attends annual eye exams? [facility].   Dental Screening: Recommended annual dental exams for proper oral hygiene  Community Resource Referral / Chronic Care  Management: CRR required this visit?  No   CCM required this visit?  No      Plan:     I have personally reviewed and noted the following in the patients chart:   Medical and social history Use of alcohol, tobacco or illicit drugs  Current medications and supplements including opioid prescriptions. Patient is not currently taking opioid prescriptions. Functional ability and status Nutritional status Physical activity Advanced directives List of other physicians Hospitalizations, surgeries, and ER visits in previous 12 months Vitals Screenings to include cognitive, depression, and falls Referrals and appointments  In addition, I have reviewed and discussed with patient certain preventive protocols, quality metrics, and best practice recommendations. A written personalized care plan for preventive services as well as general preventive health recommendations were provided to patient.     Clemetine Marker, LPN   [id number]   Nurse Notes: none

## 2021-10-13 DIAGNOSIS — R079 Chest pain, unspecified: Secondary | ICD-10-CM | POA: Diagnosis not present

## 2021-10-13 DIAGNOSIS — I2510 Atherosclerotic heart disease of native coronary artery without angina pectoris: Secondary | ICD-10-CM | POA: Diagnosis not present

## 2021-10-13 DIAGNOSIS — E782 Mixed hyperlipidemia: Secondary | ICD-10-CM | POA: Diagnosis not present

## 2021-10-13 DIAGNOSIS — R0789 Other chest pain: Secondary | ICD-10-CM | POA: Diagnosis not present

## 2021-10-13 DIAGNOSIS — I10 Essential (primary) hypertension: Secondary | ICD-10-CM | POA: Diagnosis not present

## 2021-10-14 ENCOUNTER — Other Ambulatory Visit: Payer: Self-pay | Admitting: Student

## 2021-10-14 ENCOUNTER — Ambulatory Visit
Admission: RE | Admit: 2021-10-14 | Discharge: 2021-10-14 | Disposition: A | Discharge status: Home / Self Care | Payer: Medicare HMO | Source: Ambulatory Visit | Attending: Student | Admitting: Student

## 2021-10-14 ENCOUNTER — Other Ambulatory Visit: Payer: Self-pay

## 2021-10-14 DIAGNOSIS — J9811 Atelectasis: Secondary | ICD-10-CM | POA: Diagnosis not present

## 2021-10-14 DIAGNOSIS — R079 Chest pain, unspecified: Secondary | ICD-10-CM | POA: Diagnosis not present

## 2021-10-14 LAB — POCT I-STAT CREATININE: Creatinine, Ser: 1 mg/dL (ref 0.61–1.24)

## 2021-10-14 MED ORDER — IOHEXOL 350 MG/ML SOLN
75.0000 mL | Freq: Once | INTRAVENOUS | Status: AC | PRN
  Administered 2021-10-14: 75 mL via INTRAVENOUS

## 2022-03-02 ENCOUNTER — Ambulatory Visit (INDEPENDENT_AMBULATORY_CARE_PROVIDER_SITE_OTHER): Payer: Medicare HMO | Admitting: Family Medicine

## 2022-03-02 ENCOUNTER — Encounter: Payer: Self-pay | Admitting: Family Medicine

## 2022-03-02 VITALS — BP 120/78 | HR 64 | Ht 69.0 in | Wt 180.0 lb

## 2022-03-02 DIAGNOSIS — I10 Essential (primary) hypertension: Secondary | ICD-10-CM

## 2022-03-02 DIAGNOSIS — K219 Gastro-esophageal reflux disease without esophagitis: Secondary | ICD-10-CM | POA: Diagnosis not present

## 2022-03-02 MED ORDER — AMLODIPINE BESYLATE 10 MG PO TABS: 10.0000 mg | ORAL_TABLET | Freq: Every day | ORAL | 1 refills | Status: DC

## 2022-03-02 MED ORDER — OMEPRAZOLE 20 MG PO CPDR: 20.0000 mg | DELAYED_RELEASE_CAPSULE | Freq: Every day | ORAL | 1 refills | Status: DC

## 2022-03-02 NOTE — Progress Notes (Signed)
Date:  03/02/2022   Name:  Marco Sims   DOB:  1933/08/26   MRN:  [id number]   Chief Complaint: Gastroesophageal Reflux and Hypertension  Gastroesophageal Reflux He reports no abdominal pain, no chest pain, no dysphagia, no heartburn or no nausea. This is a chronic problem. The current episode started more than 1 year ago. The problem occurs rarely. The problem has been gradually improving. The symptoms are aggravated by certain foods. Pertinent negatives include no anemia, fatigue, melena, muscle weakness, orthopnea or weight loss. There are no known risk factors. He has tried a PPI for the symptoms. The treatment provided moderate relief.  Hypertension This is a chronic problem. The current episode started more than 1 year ago. The problem has been gradually improving since onset. The problem is controlled. Pertinent negatives include no chest pain, headaches, orthopnea, palpitations, PND or shortness of breath. Past treatments include calcium channel blockers.    Lab Results  Component Value Date   NA 143 09/02/2021   K 4.4 09/02/2021   CO2 23 09/02/2021   GLUCOSE 123 (H) 09/02/2021   BUN 20 09/02/2021   CREATININE 1.00 10/14/2021   CALCIUM 9.0 09/02/2021   EGFR 62 09/02/2021   GFRNONAA 71 09/15/2020   Lab Results  Component Value Date   CHOL 160 09/02/2021   HDL 44 09/02/2021   LDLCALC 98 09/02/2021   LDLDIRECT 168.9 01/10/2011   TRIG 98 09/02/2021   CHOLHDL 5.6 (H) 01/09/2018   Lab Results  Component Value Date   TSH 2.62 01/10/2011   No results found for: "HGBA1C" Lab Results  Component Value Date   WBC 5.0 09/26/2019   HGB 12.1 (L) 09/26/2019   HCT 37.7 (L) 09/26/2019   MCV 79.7 (L) 09/26/2019   PLT 228 09/26/2019   Lab Results  Component Value Date   ALT 27 06/14/2018   AST 27 06/14/2018   ALKPHOS 87 06/14/2018   BILITOT 1.1 06/14/2018   Lab Results  Component Value Date   VD25OH 38 01/24/2011     Review of Systems  Constitutional:  Negative  for fatigue and weight loss.  Respiratory:  Negative for chest tightness and shortness of breath.   Cardiovascular:  Negative for chest pain, palpitations, orthopnea, leg swelling and PND.  Gastrointestinal:  Negative for abdominal pain, dysphagia, heartburn, melena and nausea.  Genitourinary:  Negative for difficulty urinating.  Musculoskeletal:  Negative for muscle weakness.  Neurological:  Negative for headaches.    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   Hematochezia    Benign neoplasm of ascending colon    Essential hypertension 01/09/2018   GERD 11/04/2009   ERECTILE DYSFUNCTION 07/28/2008   DERMATITIS 07/28/2008   LABYRINTHITIS 11/05/2007   HYPERLIPIDEMIA 09/24/2007   ANEMIA 09/24/2007   CONDUCTIVE HEARING LOSS UNILATERAL 09/24/2007   ARTHRITIS 09/24/2007   UNS ADVRS EFF OTH RX MEDICINAL&BIOLOGICAL SBSTNC 09/24/2007    Allergies  Allergen Reactions   Lisinopril Cough    Past Surgical History:  Procedure Laterality Date   AMPUTATION Left 09/26/2019   Procedure: LEFT LITTLE FINGER TIP, AMPUTATION;  Surgeon: Hessie Knows, MD;  Location: [facility];  Service: Orthopedics;  Laterality: Left;   Colles' fracture right 1956     COLONOSCOPY  2013   COLONOSCOPY WITH PROPOFOL N/A 07/11/2018   Procedure: COLONOSCOPY WITH PROPOFOL;  Surgeon: Lucilla Lame, MD;  Location: [place];  Service: Endoscopy;  Laterality: N/A;   FRACTURE SURGERY  fractured left arm radius and  ulna 1955     fractured right tibiofibular 2009     HAND SURGERY  06/02/11   HERNIA REPAIR Right 2010, 2015   HERNIA REPAIR Left 2015   herniorpahy, rt  05/09/2009   NERVE REPAIR Right 08/12/2015   Procedure: EXPLORATION REPAIR  NERVE, DEBRIDEMENT FRACTURE RIGHT INDEX MIDDLE FINGER;  Surgeon: Daryll Brod, MD;  Location: [place];  Service: Orthopedics;  Laterality: Right;   POLYPECTOMY  07/11/2018   Procedure: POLYPECTOMY;  Surgeon: Lucilla Lame,  MD;  Location: [place];  Service: Endoscopy;;   ruptured left Achilles tendon 1980     surgery for cubital tunnel syndrome and left arm 1999     tonsillectomy 1942      Social History   Tobacco Use   Smoking status: Never   Smokeless tobacco: Never   Tobacco comments:    smoking cessation materials not required  Vaping Use   Vaping Use: Never used  Substance Use Topics   Alcohol use: Yes    Alcohol/week: 0.0 standard drinks of alcohol    Comment: rare; 1 to 2 beers a month   Drug use: No     Medication list has been reviewed and updated.  Current Meds  Medication Sig   amLODipine (NORVASC) 10 MG tablet Take 1 tablet (10 mg total) by mouth daily.   mupirocin ointment (BACTROBAN) 2 % Apply to crusts on the left lower leg QAM.   omeprazole (PRILOSEC) 20 MG capsule Take 1 capsule (20 mg total) by mouth daily.   triamcinolone ointment (KENALOG) 0.1 % Apply to left lower leg QHS as directed.       03/02/2022    9:12 AM 09/02/2021    8:35 AM 03/02/2021    8:00 AM 03/09/2020    1:21 PM  GAD 7 : Generalized Anxiety Score  Nervous, Anxious, on Edge 0 0 0 0  Control/stop worrying 0 0 0 0  Worry too much - different things 0 0 0 0  Trouble relaxing 0 0 0 0  Restless 0 0 0 0  Easily annoyed or irritable 0 0 0 0  Afraid - awful might happen 0 0 0 0  Total GAD 7 Score 0 0 0 0  Anxiety Difficulty Not difficult at all Not difficult at all  Not difficult at all       03/02/2022    9:12 AM 09/19/2021   10:38 AM 09/02/2021    8:34 AM  Depression screen PHQ 2/9  Decreased Interest 0 0 0  Down, Depressed, Hopeless 0 0 0  PHQ - 2 Score 0 0 0  Altered sleeping 0 1 0  Tired, decreased energy 0 0 0  Change in appetite 0 0 0  Feeling bad or failure about yourself  0 0 0  Trouble concentrating 0 0 0  Moving slowly or fidgety/restless 0 0 0  Suicidal thoughts 0 0 0  PHQ-9 Score 0 1 0  Difficult doing work/chores Not difficult at all Not difficult at all Not difficult at all     BP Readings from Last 3 Encounters:  03/02/22 120/78  09/19/21 132/72  09/02/21 138/62    Physical Exam Vitals and nursing note reviewed.  HENT:     Head: Normocephalic.     Right Ear: External ear normal.     Left Ear: External ear normal.     Nose: Nose normal. No congestion or rhinorrhea.  Eyes:     General:  No scleral icterus.       Right eye: No discharge.        Left eye: No discharge.     Conjunctiva/sclera: Conjunctivae normal.     Pupils: Pupils are equal, round, and reactive to light.  Neck:     Thyroid: No thyromegaly.     Vascular: No JVD.     Trachea: No tracheal deviation.  Cardiovascular:     Rate and Rhythm: Normal rate and regular rhythm.     Heart sounds: Normal heart sounds. No murmur heard.    No friction rub. No gallop.  Pulmonary:     Effort: No respiratory distress.     Breath sounds: Normal breath sounds. No wheezing, rhonchi or rales.  Abdominal:     General: Bowel sounds are normal.     Palpations: Abdomen is soft. There is no mass.     Tenderness: There is no abdominal tenderness. There is no guarding or rebound.  Musculoskeletal:        General: No tenderness. Normal range of motion.     Cervical back: Normal range of motion and neck supple.  Lymphadenopathy:     Cervical: No cervical adenopathy.  Skin:    General: Skin is warm.     Findings: No rash.  Neurological:     Mental Status: He is alert and oriented to person, place, and time.     Cranial Nerves: No cranial nerve deficit.     Deep Tendon Reflexes: Reflexes are normal and symmetric.     Wt Readings from Last 3 Encounters:  03/02/22 180 lb (81.6 kg)  09/19/21 188 lb 6.4 oz (85.5 kg)  09/02/21 185 lb (83.9 kg)    BP 120/78   Pulse 64   Ht _0  (1.753 m)   Wt 180 lb (81.6 kg)   BMI 26.58 kg/m   Assessment and Plan:   1. Essential hypertension Chronic.  Controlled.  Stable.  Blood pressure is 120/78.  Asymptomatic.  Tolerating medication well.  Continue  amlodipine 10 mg once a day.  We will recheck CMP for electrolytes and GFR.  And will recheck patient in 6 months - amLODipine (NORVASC) 10 MG tablet; Take 1 tablet (10 mg total) by mouth daily.  Dispense: 90 tablet; Refill: 1 - Comprehensive Metabolic Panel (CMET)  2. Gastroesophageal reflux disease, unspecified whether esophagitis present Chronic.  Controlled.  Stable.  Patient having control with omeprazole 20 mg once a day.  We will continue for 6 months and recheck patient at that time - omeprazole (PRILOSEC) 20 MG capsule; Take 1 capsule (20 mg total) by mouth daily.  Dispense: 90 capsule; Refill: 1

## 2022-03-03 LAB — COMPREHENSIVE METABOLIC PANEL
ALT: 16 IU/L (ref 0–44)
AST: 16 IU/L (ref 0–40)
Albumin/Globulin Ratio: 1.9 (ref 1.2–2.2)
Albumin: 4.3 g/dL (ref 3.7–4.7)
Alkaline Phosphatase: 113 IU/L (ref 44–121)
BUN/Creatinine Ratio: 17 (ref 10–24)
BUN: 17 mg/dL (ref 8–27)
Bilirubin Total: 0.6 mg/dL (ref 0.0–1.2)
CO2: 23 mmol/L (ref 20–29)
Calcium: 9.1 mg/dL (ref 8.6–10.2)
Chloride: 108 mmol/L — ABNORMAL HIGH (ref 96–106)
Creatinine, Ser: 1 mg/dL (ref 0.76–1.27)
Globulin, Total: 2.3 g/dL (ref 1.5–4.5)
Glucose: 124 mg/dL — ABNORMAL HIGH (ref 70–99)
Potassium: 4.2 mmol/L (ref 3.5–5.2)
Sodium: 145 mmol/L — ABNORMAL HIGH (ref 134–144)
Total Protein: 6.6 g/dL (ref 6.0–8.5)
eGFR: 74 mL/min/{1.73_m2} (ref >59)

## 2022-05-01 ENCOUNTER — Encounter (HOSPITAL_COMMUNITY): Payer: Self-pay

## 2022-05-01 ENCOUNTER — Emergency Department (HOSPITAL_COMMUNITY)
Admission: EM | Admit: 2022-05-01 | Discharge: 2022-05-01 | Discharge status: Against Medical Advice | Payer: Medicare HMO | Attending: Emergency Medicine | Admitting: Emergency Medicine

## 2022-05-01 ENCOUNTER — Other Ambulatory Visit: Payer: Self-pay

## 2022-05-01 DIAGNOSIS — R109 Unspecified abdominal pain: Secondary | ICD-10-CM | POA: Insufficient documentation

## 2022-05-01 DIAGNOSIS — M79651 Pain in right thigh: Secondary | ICD-10-CM | POA: Diagnosis not present

## 2022-05-01 DIAGNOSIS — M545 Low back pain, unspecified: Secondary | ICD-10-CM | POA: Diagnosis not present

## 2022-05-01 DIAGNOSIS — Z5321 Procedure and treatment not carried out due to patient leaving prior to being seen by health care provider: Secondary | ICD-10-CM | POA: Diagnosis not present

## 2022-05-01 NOTE — ED Provider Triage Note (Signed)
Emergency Medicine Provider Triage Evaluation Note  Marco Sims , a 88 y.o. male  was evaluated in triage.  Pt complains of back pain onset this am around 9am left lower back. Replacing a railing and noticed pain in middle of lower back hurting. Has low back pain occasionally that he doesn't think much of. Took on OTC low back pain med. Pain wasn't severe at that time, was able to walk into the store to buy the medication. Got to his apartment and pain became significant, had to use arms to get up from chair, pain lateralizing to left side and unable to put much weight on left leg to go a short distance. Developed left abdominal pain around 7pm which is unusual.  Yesterday had pain in right anterior thigh which is not typical.  Pain in back improved with l;leaning forward earlier today. No pain while sitting in wheelchair currently in triage.  Review of Systems  Positive: Left back pain, left abdominal pain Negative: Fevers, fall, saddle paresthesia, loss of bowel or bladder control   Physical Exam  BP (!) 141/66   Pulse 61   Temp 97.6 F (36.4 C) (Oral)   Resp 15   Ht '5\' 9"'$  (1.753 m)   Wt 79.4 kg   SpO2 98%   BMI 25.84 kg/m  Gen:   Awake, no distress   Resp:  Normal effort  MSK:   Moves extremities without difficulty  Other:  TTP left side abdomen. No TTP back Is able to stand and bear weight on left leg  Medical Decision Making  Medically screening exam initiated at 10:00 PM.  Appropriate orders placed.  Karma Lew was informed that the remainder of the evaluation will be completed by another provider, this initial triage assessment does not replace that evaluation, and the importance of remaining in the ED until their evaluation is complete.     Tacy Learn, PA-C 05/01/22 [id number]

## 2022-05-01 NOTE — ED Notes (Signed)
Pt states that he is leaving.

## 2022-05-01 NOTE — ED Triage Notes (Signed)
Pt had a pain in his lower back today that caused him to be unable to put weight on his left leg.

## 2022-05-01 NOTE — ED Notes (Signed)
Needs new IV for CT

## 2022-05-02 ENCOUNTER — Ambulatory Visit: Payer: Self-pay | Admitting: *Deleted

## 2022-05-02 ENCOUNTER — Emergency Department: Payer: Medicare HMO

## 2022-05-02 ENCOUNTER — Emergency Department
Admission: EM | Admit: 2022-05-02 | Discharge: 2022-05-02 | Disposition: A | Discharge status: Home / Self Care | Payer: Medicare HMO | Attending: Student in an Organized Health Care Education/Training Program | Admitting: Student in an Organized Health Care Education/Training Program

## 2022-05-02 ENCOUNTER — Encounter: Payer: Self-pay | Admitting: Emergency Medicine

## 2022-05-02 DIAGNOSIS — S72002A Fracture of unspecified part of neck of left femur, initial encounter for closed fracture: Secondary | ICD-10-CM | POA: Diagnosis not present

## 2022-05-02 DIAGNOSIS — M1612 Unilateral primary osteoarthritis, left hip: Secondary | ICD-10-CM | POA: Diagnosis not present

## 2022-05-02 DIAGNOSIS — M545 Low back pain, unspecified: Secondary | ICD-10-CM | POA: Diagnosis not present

## 2022-05-02 DIAGNOSIS — M4316 Spondylolisthesis, lumbar region: Secondary | ICD-10-CM | POA: Diagnosis not present

## 2022-05-02 DIAGNOSIS — M25552 Pain in left hip: Secondary | ICD-10-CM | POA: Diagnosis not present

## 2022-05-02 DIAGNOSIS — M48061 Spinal stenosis, lumbar region without neurogenic claudication: Secondary | ICD-10-CM | POA: Insufficient documentation

## 2022-05-02 DIAGNOSIS — D492 Neoplasm of unspecified behavior of bone, soft tissue, and skin: Secondary | ICD-10-CM | POA: Insufficient documentation

## 2022-05-02 DIAGNOSIS — M169 Osteoarthritis of hip, unspecified: Secondary | ICD-10-CM | POA: Diagnosis not present

## 2022-05-02 DIAGNOSIS — I10 Essential (primary) hypertension: Secondary | ICD-10-CM | POA: Insufficient documentation

## 2022-05-02 DIAGNOSIS — R269 Unspecified abnormalities of gait and mobility: Secondary | ICD-10-CM | POA: Diagnosis not present

## 2022-05-02 DIAGNOSIS — D3610 Benign neoplasm of peripheral nerves and autonomic nervous system, unspecified: Secondary | ICD-10-CM | POA: Diagnosis not present

## 2022-05-02 DIAGNOSIS — S73192A Other sprain of left hip, initial encounter: Secondary | ICD-10-CM | POA: Diagnosis not present

## 2022-05-02 MED ORDER — GADOPICLENOL 0.5 MMOL/ML IV SOLN
7.5000 mL | Freq: Once | INTRAVENOUS | Status: AC | PRN
  Administered 2022-05-02: 7.5 mL via INTRAVENOUS

## 2022-05-02 MED ORDER — MELOXICAM 15 MG PO TABS: 15.0000 mg | ORAL_TABLET | Freq: Every day | ORAL | 11 refills | Status: DC

## 2022-05-02 NOTE — Discharge Instructions (Addendum)
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 Cari Caraway).  You may give them a call today to schedule appointment within the next 1 to 2 weeks.  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 Harlow Mares).  You may call today to schedule appointment.  -Avoid any strenuous activities over the next few days.  -You may take meloxicam as needed for pain.  You may additionally add acetaminophen to this regimen.  -Return to the emergency department anytime if you begin to experience any new or worsening symptoms.

## 2022-05-02 NOTE — ED Provider Notes (Signed)
[facility] Provider Note    Event Date/Time   First MD Initiated Contact with Patient 05/02/22 [id number]     (approximate)   History   Chief Complaint Hip Pain   HPI Marco Sims is a 88 y.o. male, history of hyperlipidemia, hypertension, arthritis, GERD, presents to the emergency department for evaluation of left hip pain.  Patient states that he has been experiencing pain in the lower portion of his back extending into his left hip x2 days.  Reports his pain is worse whenever he sits up straight.  Denies any recent injuries or falls.  He states that he has been working outside a lot recently doing some projects.  No prior history of back or hip problems.  He states that he is still able to stand and walk, however does endorse significant difficulty bearing full weight on the left side.  Denies fever/chills, chest pain, shortness of breath, abdominal pain, saddle anesthesia, numbness/tingling in the affected extremity, cold sensation, dysuria, nausea/vomiting, or diarrhea.  History Limitations: No limitations.        Physical Exam  Triage Vital Signs: ED Triage Vitals  Enc Vitals Group     BP --      Pulse --      Resp --      Temp --      Temp src --      SpO2 --      Weight 05/02/22 0932 175 lb 0.7 oz (79.4 kg)     Height 05/02/22 0932 '5\' 9"'$  (1.753 m)     Head Circumference --      Peak Flow --      Pain Score 05/02/22 0931 5     Pain Loc --      Pain Edu? --      Excl. in [place]? --     Most recent vital signs: Vitals:   05/02/22 1256 05/02/22 1423  BP: (!) 150/88 (!) 148/80  Pulse: 60 63  Resp: 16 16  Temp:    SpO2: 96% 96%    General: Awake, NAD.  Skin: Warm, dry. No rashes or lesions.  Eyes: PERRL. Conjunctivae normal.  CV: Good peripheral perfusion.  Resp: Normal effort.  Abd: Soft, non-tender. No distention.  Neuro: At baseline. No gross neurological deficits.  Musculoskeletal: Normal ROM of all extremities.  Focused Exam: No  gross deformities to the lumbar spine or left hip.  No significant osseous tenderness along the lumbar spine or left hip joint.  No overlying erythema or warmth.  Negative straight leg test bilaterally.  PMS intact distally.  He is still able to stand and take a few steps, however does have significant difficulty bearing full weight on that extremity.  Physical Exam    ED Results / Procedures / Treatments  Labs (all labs ordered are listed, but only abnormal results are displayed) Labs Reviewed - No data to display   EKG N/A.    RADIOLOGY  ED Provider Interpretation: I personally viewed and interpreted these images.  MRI lumbar spine shows evidence of severe spinal stenosis at L3, L4, and L5.  MRI left hip shows osteoarthritis.  MR Lumbar Spine W Wo Contrast  Result Date: 05/02/2022 CLINICAL DATA:  Left hip pain for 2 days EXAM: MRI LUMBAR SPINE WITHOUT AND WITH CONTRAST TECHNIQUE: Multiplanar and multiecho pulse sequences of the lumbar spine were obtained without and with intravenous contrast. CONTRAST:  7.5 ml Vueway COMPARISON:  None Available. FINDINGS: Segmentation:  Standard. Alignment:  Grade 1 anterolisthesis of L4 on L5. Vertebrae:  Multilevel reactive degenerative marrow changes. Conus medullaris and cauda equina: Conus extends to the L1 level. Conus and cauda equina appear normal. There is a 1.2 x 0.8 by 1.2 cm T2 hyperintense T1 hypointense intradural extramedullary lesion along the anterior aspect of the conus at the T12-L1 level (series 8, image 4). This mass demonstrates mild contrast enhancement (series 10, image 8). Paraspinal and other soft tissues: 1.1 cm retrperitoneal lymph node (series8, image 28). Disc levels: T11-T12: Only imaged in the sagittal plane. No spinal canal or neural foraminal stenosis. T12-L1: See above for description of the T2 hyperintense lesion within the ventral aspect of the spinal canal. Mild bilateral facet degenerative change. No neural foraminal  stenosis. No bony spinal canal stenosis. L1-L2: Mild bilateral facet degenerative change. Circumferential disc bulge. Mild spinal canal stenosis. Mild-to-moderate bilateral neural foraminal stenosis. L2-L3: Moderate bilateral facet degenerative change with subchondral cystic change. Ligamentum flavum hypertrophy. Mild-to-moderate spinal canal stenosis. No neural foraminal stenosis. L3-L4: Severe bilateral facet degenerative change. Ligamentum flavum hypertrophy. Circumferential disc bulge. Moderate spinal canal stenosis. No significant neural foraminal stenosis. L4-L5: Severe bilateral facet degenerative change. Ligamentum flavum hypertrophy. Circumferential disc bulge. Moderate to severe spinal canal stenosis. Mild bilateral neural foraminal stenosis. L5-S1: Severe left and moderate right facet degenerative change. Mild left neural foraminal stenosis. Circumferential disc bulge. No significant spinal canal stenosis. IMPRESSION: 1. There is a 1.2 x 0.8 x 1.2 cm intradural lesion along the ventral aspect of the spinal canal at the T12-L1 level. This lesion is T2 hyperintense and demonstrates subtle heterogenous contrast enhancement. In the absence of a known malignancy, this could potentially represent a nerve sheath tumor. Comparison with prior imaging would be helpful to assess for chronicity. This mass also causes moderate to severe spinal canal stenosis at this level. 2. Moderate to severe degenerative spinal canal stenosis is seen at L3-L4 and L4-L5, as above. Electronically Signed   By: Marin Roberts M.D.   On: 05/02/2022 13:31   MR HIP LEFT WO CONTRAST  Result Date: 05/02/2022 CLINICAL DATA:  Fracture, hip EXAM: MR OF THE LEFT HIP WITHOUT CONTRAST TECHNIQUE: Multiplanar, multisequence MR imaging was performed. No intravenous contrast was administered. COMPARISON:  None Available. FINDINGS: Bones: There is no evidence of acute fracture, dislocation or avascular necrosis. No focal bone lesion. The sacroiliac  joints are unremarkable. There is moderate degenerative change of the pubic symphysis. Multilevel degenerative changes of the lumbar spine, see separately dictated lumbar spine MRI. Articular cartilage and labrum Articular cartilage: Moderate chondrosis most prominent superolaterally. Labrum: Degenerative anterior superior labral tearing. Joint or bursal effusion Joint effusion: No significant hip joint effusion. Bursae: No evidence of trochanteric bursitis. Muscles and tendons Muscles and tendons: The gluteal tendons are intact. The proximal hamstrings are intact.The adductors are intact. Right greater than left gluteal muscle atrophy. Other findings Miscellaneous: Enlarged prostate gland. Partially visualized renal cysts. No acute findings. IMPRESSION: No evidence of acute hip fracture. Moderate osteoarthritis of the left hip with degenerative anterior superior labral tearing. Multilevel degenerative disc disease of the lower lumbar spine, see separately dictated lumbar spine MRI. Electronically Signed   By: Maurine Simmering M.D.   On: 05/02/2022 13:03   CT Hip Left Wo Contrast  Result Date: 05/02/2022 CLINICAL DATA:  Hip pain, stress fracture suspected. Negative x-ray. Unable to bear weight. EXAM: CT OF THE LEFT HIP WITHOUT CONTRAST TECHNIQUE: Multidetector CT imaging of the left hip was performed according to the standard protocol. Multiplanar CT  image reconstructions were also generated. RADIATION DOSE REDUCTION: This exam was performed according to the departmental dose-optimization program which includes automated exposure control, adjustment of the mA and/or kV according to patient size and/or use of iterative reconstruction technique. COMPARISON:  None Available. FINDINGS: Bones/Joint/Cartilage No evidence of fracture or dislocation. Superolateral hip joint space narrowing with acetabular lip and femoral head neck junction osteophytes. No appreciable joint effusion. Ligaments Suboptimally assessed by CT.  Muscles and Tendons Enthesopathy at the hamstring origin. Muscles and tendons are otherwise unremarkable. Soft tissues Left sided hydrocele. Skin and subcutaneous soft tissues are unremarkable. IMPRESSION: 1. No CT evidence of acute fracture or dislocation. Mild left hip osteoarthritis. If there is persistent clinical concern MRI examination without contrast is suggested to rule out nondisplaced/stress fracture. 2.  Left hydrocele. Electronically Signed   By: Keane Police D.O.   On: 05/02/2022 10:29   CT Lumbar Spine Wo Contrast  Result Date: 05/02/2022 CLINICAL DATA:  Left hip pain for 1 day, unable to bear weight. EXAM: CT LUMBAR SPINE WITHOUT CONTRAST TECHNIQUE: Multidetector CT imaging of the lumbar spine was performed without intravenous contrast administration. Multiplanar CT image reconstructions were also generated. RADIATION DOSE REDUCTION: This exam was performed according to the departmental dose-optimization program which includes automated exposure control, adjustment of the mA and/or kV according to patient size and/or use of iterative reconstruction technique. COMPARISON:  CT abdomen/pelvis 06/14/2018 FINDINGS: Segmentation: Standard; the lowest formed disc space is designated L5-S1. Alignment: Is trace anterolisthesis of L4 on L5. Alignment is otherwise normal. Vertebrae: Vertebral body heights are preserved. There is no evidence of acute fracture. There is no suspicious osseous lesion. Paraspinal and other soft tissues: A large right renal cyst is partially imaged but was present on the study from 2019. There is a moderate-sized hiatal hernia, also unchanged. The paraspinal soft tissues are unremarkable. Disc levels: There is overall mild multilevel disc space narrowing with vacuum disc at T12-L1 through L2-L3 and L5-S1. T12-L1: No significant spinal canal or neural foraminal stenosis L1-L2: There is a mild disc bulge, mild degenerative endplate change, and mild facet arthropathy resulting in  mild bilateral neural foraminal stenosis without significant spinal canal stenosis L2-L3: There is a mild disc bulge, mild degenerative endplate change, and moderate right worse than left facet arthropathy with ligamentum flavum thickening resulting in moderate spinal canal stenosis without significant neural foraminal stenosis L3-L4: There is a disc bulge and moderate bilateral facet arthropathy with ligamentum flavum thickening resulting in severe spinal canal stenosis with probable mass effect on the cauda equina nerve roots and no significant neural foraminal stenosis L4-L5: There is a disc bulge and advanced bilateral facet arthropathy with ligamentum flavum thickening resulting in severe spinal canal stenosis with probable mass effect on the cauda equina nerve roots and mild left worse than right neural foraminal stenosis L5-S1: There is a disc bulge and moderate left and mild right facet arthropathy without significant spinal canal or neural foraminal stenosis. IMPRESSION: 1. No acute finding in the lumbar spine. 2. Multilevel degenerative changes as above resulting in severe spinal canal stenosis with probable mass effect on the cauda equina nerve roots at L3-L4 and L4-L5. Lumbar spine MRI may be considered for better evaluation as indicated. 3. Advanced facet arthropathy at L4-L5. 4. Moderate-sized hiatal hernia, unchanged since 2019. Electronically Signed   By: Valetta Mole M.D.   On: 05/02/2022 10:22    PROCEDURES:  Critical Care performed: N/A.  Procedures    MEDICATIONS ORDERED IN ED: Medications  gadopiclenol (  VUEWAY) 0.5 MMOL/ML solution 7.5 mL (7.5 mLs Intravenous Contrast Given 05/02/22 1246)     IMPRESSION / MDM / ASSESSMENT AND PLAN / ED COURSE  I reviewed the triage vital signs and the nursing notes.                              Differential diagnosis includes, but is not limited to, lumbar spine fracture, left hip fracture, disc herniation, sciatica, lumbar radiculopathy,  lumbosacral strain, piriformis syndrome.   Assessment/Plan Patient presents with atraumatic low back and left hip pain.  Physical exam is fairly unremarkable overall.  No signs of radiculopathy.  History and physical exam not concerning for cauda equina syndrome.  MRI does show significant spinal canal stenosis, which may explain his low back pain and also explain why he has most of his pain whenever he is sitting up straight.  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  Spoke with the on-call neurosurgeon, Dr. Cari Caraway, who states that this is likely a benign finding that does not require any immediate action at this time.  He did say that he would be willing to follow-up with the patient outpatient within the next couple weeks though.  In regards to his left hip pain, I suspect this is likely from osteoarthritis versus his degenerative labrum tear.  He is still able to stand up and ambulate on his own, though does have some difficulty due to pain.  We will provide her with a prescription for meloxicam. We will additionally provide him with a referral to orthopedics for further evaluation and management.  Will discharge.  Considered admission for this patient, but given his stable presentation and access to close follow-up, he is unlikely to benefit from admission.  Provided the patient with anticipatory guidance, return precautions, and educational material. Encouraged the patient to return to the emergency department at any time if they begin to experience any new or worsening symptoms. Patient expressed understanding and agreed with the plan.   Patient's presentation is most consistent with acute complicated illness / injury requiring diagnostic workup.       FINAL CLINICAL IMPRESSION(S) / ED DIAGNOSES   Final diagnoses:  Spinal stenosis of lumbar region, unspecified whether neurogenic claudication present   Osteoarthritis of left hip, unspecified osteoarthritis type  Nerve sheath tumor     Rx / DC Orders   ED Discharge Orders          Ordered    meloxicam (MOBIC) 15 MG tablet  Daily        05/02/22 1400             Note:  This document was prepared using Dragon voice recognition software and may include unintentional dictation errors.   Teodoro Spray, [place] 05/02/22 1511    Merlyn Lot, MD 05/02/22 1534

## 2022-05-02 NOTE — Progress Notes (Signed)
22g IV placed and left in right AC while in MRI for contrast admin

## 2022-05-02 NOTE — ED Triage Notes (Signed)
C/O left hip pain x 1 day.  States unable to bear weight.  Denies injury.

## 2022-05-02 NOTE — ED Notes (Signed)
See triage note  Presents with pain to left hip area  States he thought it was lower back yesterday  But now states is located in left hip area  denies any injury

## 2022-05-02 NOTE — Telephone Encounter (Addendum)
  Chief Complaint: Severe left hip pain   (Chronic hip pain) Symptoms: Can't walk at all due to pain.   Can't bear weight Frequency: Since last night the severe pain started was taken to ED in [place]. Pertinent Negatives: Patient denies being able to get him out to the car to take him anywhere for help.  Instructed to call 911. Disposition: '[x]'$ ED /'[]'$ Urgent Care (no appt availability in office) / '[]'$ Appointment(In office/virtual)/ '[]'$  [facility]/ '[]'$ Home Care/ '[]'$ Refused Recommended Disposition /'[]'$ [facility]/ '[]'$  Follow-up with PCP Additional Notes: Florine Eulas Post, friend agreeable to calling 911 and having him taken to hospital in [place].   Doesn't know why he was taken to ED in [place] last night.    She is requesting Dr. [name] call her when she gets a chance at [phone].   I called in Primary Care and Sports Medicine at [facility] and spoke with Levada Dy.  I made her aware of the ED referral.   I sent my notes over.

## 2022-05-02 NOTE — Telephone Encounter (Signed)
Friend Marco Sims calling in.   He can't walk.   He can't walk  He went to ED last night.   They told us to call PCP to get x rays done.   [facility] went [facility].   Someone else took him to the ED.      Reason for Disposition  Can't stand (bear weight) or walk  Answer Assessment - Initial Assessment Questions 1. LOCATION and RADIATION: "Where is the pain located?"      Hip pain from breaking his leg years ago.    They did not set his leg straight.    He has walked with a limp for years.   ED told him to call PCP to get x rays done wen he went last night. Friend Marco Eulas Post is calling in.   Pt was taken to the ED in [place] last night by someone else, not Marco.   They told him to call his PCP and they could take care of him and have x rays done.   2. QUALITY: "What does the pain feel like?"  (e.g., sharp, dull, aching, burning)     Severe pain in left hip.     3. SEVERITY: "How bad is the pain?" "What does it keep you from doing?"   (Scale 1-10; or mild, moderate, severe)   -  MILD (1-3): doesn't interfere with normal activities    -  MODERATE (4-7): interferes with normal activities (e.g., work or school) or awakens from sleep, limping    -  SEVERE (8-10): excruciating pain, unable to do any normal activities, unable to walk     Pt is in such severe pain that he can't put any weight on his legs which is why he was taken to the ED last night.   He finally left because they told him they were so short staffed.   Blood work was done but that was all. 4. ONSET: "When did the pain start?" "Does it come and go, or is it there all the time?"     This is a chronic problem.   He has walked with a limp for years from where he broke his leg years ago and it was set wrong.   This has messed up his hip over time. 5. WORK OR EXERCISE: "Has there been any recent work or exercise that involved this part of the body?"      No recent injuries, falls or accidents 6. CAUSE: "What do you  think is causing the hip pain?"      Chronic problem   See above 7. AGGRAVATING FACTORS: "What makes the hip pain worse?" (e.g., walking, climbing stairs, running)     Can't put weight on legs due to severe pain last night and this morning.   He can't walk at all.    I can't get him to the car to take him anywhere. 8. OTHER SYMPTOMS: "Do you have any other symptoms?" (e.g., back pain, pain shooting down leg,  fever, rash)     No  Protocols used: Hip Pain-A-AH

## 2022-05-05 DIAGNOSIS — M545 Low back pain, unspecified: Secondary | ICD-10-CM | POA: Diagnosis not present

## 2022-05-05 DIAGNOSIS — M25552 Pain in left hip: Secondary | ICD-10-CM | POA: Diagnosis not present

## 2022-05-15 NOTE — Progress Notes (Unsigned)
Referring Physician:  Teodoro Sims, [address] [place],  [place] [address]  Primary Physician:  Marco Patch, MD  History of Present Illness: 05/15/2022 [name] is here today with a chief complaint of *** low back pain that radiates into the left hip.   Duration: ***1-2 weeks? Location: *** Quality: *** Severity: *** 10/10 Precipitating: aggravated by ***sitting up straight, bearing weight on his left side.  Modifying factors: made better by ***nothing? Weakness: none Timing: ***constant?  Bowel/Bladder Dysfunction: none  Conservative measures:  Physical therapy: *** has not participated in? Multimodal medical therapy including regular antiinflammatories: *** meloxicam  Injections: *** has not received any epidural steroid injections  Past Surgery: *** denies  Marco Sims has ***no symptoms of cervical myelopathy.  The symptoms are causing a significant impact on the patient's life.   Review of Systems:  A 10 point review of systems is negative, except for the pertinent positives and negatives detailed in the HPI.  Past Medical History: Past Medical History:  Diagnosis Date   Allergy    seaasonal   Anemia    Colon polyp    GERD (gastroesophageal reflux disease)    Hypertension    Lichen simplex chronicus    Wears hearing aid in both ears     Past Surgical History: Past Surgical History:  Procedure Laterality Date   AMPUTATION Left 09/26/2019   Procedure: LEFT LITTLE FINGER TIP, AMPUTATION;  Surgeon: Marco Knows, MD;  Location: [facility];  Service: Orthopedics;  Laterality: Left;   Colles' fracture right 1956     COLONOSCOPY  2013   COLONOSCOPY WITH PROPOFOL N/A 07/11/2018   Procedure: COLONOSCOPY WITH PROPOFOL;  Surgeon: Marco Lame, MD;  Location: [facility];  Service: Endoscopy;  Laterality: N/A;   FRACTURE SURGERY     fractured left arm radius and  ulna 1955     fractured right tibiofibular 2009     HAND SURGERY   06/02/11   HERNIA REPAIR Right 2010, 2015   HERNIA REPAIR Left 2015   herniorpahy, rt  05/09/2009   NERVE REPAIR Right 08/12/2015   Procedure: EXPLORATION REPAIR  NERVE, DEBRIDEMENT FRACTURE RIGHT INDEX MIDDLE FINGER;  Surgeon: Marco Brod, MD;  Location: [facility];  Service: Orthopedics;  Laterality: Right;   POLYPECTOMY  07/11/2018   Procedure: POLYPECTOMY;  Surgeon: Marco Lame, MD;  Location: [facility];  Service: Endoscopy;;   ruptured left Achilles tendon 1980     surgery for cubital tunnel syndrome and left arm 1999     tonsillectomy 1942      Allergies: Allergies as of 05/16/2022 - Review Complete 05/02/2022  Allergen Reaction Noted   Lisinopril Cough 03/02/2021    Medications: No outpatient medications have been marked as taking for the 05/16/22 encounter (Appointment) with Marco Maw, MD.    Social History: Social History   Tobacco Use   Smoking status: Never   Smokeless tobacco: Never   Tobacco comments:    smoking cessation materials not required  Vaping Use   Vaping Use: Never used  Substance Use Topics   Alcohol use: Yes    Alcohol/week: 0.0 standard drinks of alcohol    Comment: rare; 1 to 2 beers a month   Drug use: No    Family Medical History: Family History  Problem Relation Age of Onset   Parkinson's disease Mother    Arthritis Father    Cancer Brother     Physical Examination: There were  no vitals filed for this visit.  General: Patient is well developed, well nourished, calm, collected, and in no apparent distress. Attention to examination is appropriate.  Neck:   Supple.  Full range of motion.  Respiratory: Patient is breathing without any difficulty.   NEUROLOGICAL:     Awake, alert, oriented to person, place, and time.  Speech is clear and fluent. Fund of knowledge is appropriate.   Cranial Nerves: Pupils equal round and reactive to light.  Facial tone is symmetric.  Facial sensation is symmetric.  Shoulder shrug is symmetric. Tongue protrusion is midline.  There is no pronator drift.  ROM of spine: full.    Strength: Side Biceps Triceps Deltoid Interossei Grip Wrist Ext. Wrist Flex.  R '5 5 5 5 5 5 5  '$ L '5 5 5 5 5 5 5   '$ Side Iliopsoas Quads Hamstring PF DF EHL  R '5 5 5 5 5 5  '$ L '5 5 5 5 5 5   '$ Reflexes are ***2+ and symmetric at the biceps, triceps, brachioradialis, patella and achilles.   Hoffman's is absent.   Bilateral upper and lower extremity sensation is intact to light touch.    No evidence of dysmetria noted.  Gait is normal.     Medical Decision Making  Imaging: ***  I have personally reviewed the images and agree with the above interpretation.  Assessment and Plan: [name] is a pleasant 88 y.o. male with ***   I spent a total of *** minutes in face-to-face and non-face-to-face activities related to this patient's care today.  Thank you for involving me in the care of this patient.      Marco Sims K. Marco Ribas MD, [facility] Neurosurgery

## 2022-05-16 ENCOUNTER — Encounter: Payer: Self-pay | Admitting: Neurosurgery

## 2022-05-16 ENCOUNTER — Ambulatory Visit (INDEPENDENT_AMBULATORY_CARE_PROVIDER_SITE_OTHER): Payer: Medicare HMO | Admitting: Neurosurgery

## 2022-05-16 VITALS — BP 183/87 | HR 64 | Ht 69.0 in | Wt 177.0 lb

## 2022-05-16 DIAGNOSIS — M48062 Spinal stenosis, lumbar region with neurogenic claudication: Secondary | ICD-10-CM

## 2022-05-16 DIAGNOSIS — D334 Benign neoplasm of spinal cord: Secondary | ICD-10-CM | POA: Diagnosis not present

## 2022-09-04 ENCOUNTER — Encounter: Payer: Self-pay | Admitting: Family Medicine

## 2022-09-04 ENCOUNTER — Ambulatory Visit (INDEPENDENT_AMBULATORY_CARE_PROVIDER_SITE_OTHER): Payer: Medicare HMO | Admitting: Family Medicine

## 2022-09-04 VITALS — BP 120/78 | HR 54 | Ht 69.0 in | Wt 180.0 lb

## 2022-09-04 DIAGNOSIS — Z125 Encounter for screening for malignant neoplasm of prostate: Secondary | ICD-10-CM | POA: Diagnosis not present

## 2022-09-04 DIAGNOSIS — E7801 Familial hypercholesterolemia: Secondary | ICD-10-CM

## 2022-09-04 DIAGNOSIS — I10 Essential (primary) hypertension: Secondary | ICD-10-CM

## 2022-09-04 DIAGNOSIS — K219 Gastro-esophageal reflux disease without esophagitis: Secondary | ICD-10-CM | POA: Diagnosis not present

## 2022-09-04 MED ORDER — OMEPRAZOLE 20 MG PO CPDR: 20.0000 mg | DELAYED_RELEASE_CAPSULE | Freq: Every day | ORAL | 1 refills | Status: DC

## 2022-09-04 MED ORDER — AMLODIPINE BESYLATE 10 MG PO TABS: 10.0000 mg | ORAL_TABLET | Freq: Every day | ORAL | 1 refills | Status: DC

## 2022-09-04 NOTE — Patient Instructions (Signed)
GUIDELINES FOR  LOW-CHOLESTEROL, LOW-TRIGLYCERIDE DIETS    FOODS TO USE   MEATS, FISH Choose lean meats (chicken, [place], veal, and non-fatty cuts of beef with excess fat trimmed; one serving = 3 oz of cooked meat). Also, fresh or frozen fish, canned fish packed in water, and shellfish (lobster, crabs, shrimp, and oysters). Limit use to no more than one serving of one of these per week. Shellfish are high in cholesterol but low in saturated fat and should be used sparingly. Meats and fish should be broiled (pan or oven) or baked on a rack.  EGGS Egg substitutes and egg whites (use freely). Egg yolks (limit two per week).  FRUITS Eat three servings of fresh fruit per day (1 serving =  cup). Be sure to have at least one citrus fruit daily. Frozen and canned fruit with no sugar or syrup added may be used.  VEGETABLES Most vegetables are not limited (see next page). One dark-green (string beans, escarole) or one deep yellow (squash) vegetable is recommended daily. Cauliflower, broccoli, and celery, as well as potato skins, are recommended for their fiber content. (Fiber is associated with cholesterol reduction) It is preferable to steam vegetables, but they may be boiled, strained, or braised with polyunsaturated vegetable oil (see below).  BEANS Dried peas or beans (1 serving =  cup) may be used as a bread substitute.  NUTS Almonds, walnuts, and peanuts may be used sparingly  (1 serving = 1 Tablespoonful). Use pumpkin, sesame, or sunflower seeds.  BREADS, GRAINS One roll or one slice of whole grain or enriched bread may be used, or three soda crackers or four pieces of melba toast as a substitute. Spaghetti, rice or noodles ( cup) or  large ear of corn may be used as a bread substitute. In preparing these foods do not use butter or shortening, use soft margarine. Also use egg and sugar substitutes.  Choose high fiber grains, such as oats and whole wheat.  CEREALS Use  cup of hot cereal or  cup of  cold cereal per day. Add a sugar substitute if desired, with 99% fat free or skim milk.  MILK PRODUCTS Always use 99% fat free or skim milk, dairy products such as low fat cheeses (farmer's uncreamed diet cottage), low-fat yogurt, and powdered skim milk.  FATS, OILS Use soft (not stick) margarine; vegetable oils that are high in polyunsaturated fats (such as safflower, sunflower, soybean, corn, and cottonseed). Always refrigerate meat drippings to harden the fat and remove it before preparing gravies  DESSERTS, SNACKS Limit to two servings per day; substitute each serving for a bread/cereal serving: ice milk, water sherbet (1/4 cup); unflavored gelatin or gelatin flavored with sugar substitute (1/3 cup); pudding prepared with skim milk (1/2 cup); egg white souffls; unbuttered popcorn (1  cups). Substitute carob for chocolate.  BEVERAGES Fresh fruit juices (limit 4 oz per day); black coffee, plain or herbal teas; soft drinks with sugar substitutes; club soda, preferably salt-free; cocoa made with skim milk or nonfat dried milk and water (sugar substitute added if desired); clear broth. Alcohol: limit two servings per day (see second page).  MISCELLANEOUS  You may use the following freely: vinegar, spices, herbs, nonfat bouillon, mustard, Worcestershire sauce, soy sauce, flavoring essence.                  GUIDELINES FOR  LOW-CHOLESTEROL, LOW TRIGLYCERIDE DIETS    FOODS TO AVOID   MEATS, FISH Marbled beef, pork, bacon, sausage, and other pork products; fatty  fowl (duck, goose); skin and fat of turkey and chicken; processed meats; luncheon meats (salami, bologna); frankfurters and fast-food hamburgers (theyre loaded with fat); organ meats (kidneys, liver); canned fish packed in oil.  °EGGS Limit egg yolks to two per week.   °FRUITS Coconuts (rich in saturated fats).  °VEGETABLES Avoid avocados. Starchy vegetables (potatoes, corn, lima beans, dried peas, beans) may be used only if  substitutes for a serving of bread or cereal. (Baked potato skin, however, is desirable for its fiber content.  °BEANS Commercial baked beans with sugar and/or pork added.  °NUTS Avoid nuts.  Limit peanuts and walnuts to one tablespoonful per day.  °BREADS, GRAINS Any baked goods with shortening and/or sugar. Commercial mixes with dried eggs and whole milk. Avoid sweet rolls, doughnuts, breakfast pastries (Danish), and sweetened packaged cereals (the added sugar converts readily to triglycerides).  °MILK PRODUCTS Whole milk and whole-milk packaged goods; cream; ice cream; whole-milk puddings, yogurt, or cheeses; nondairy cream substitutes.  °FATS, OILS Butter, lard, animal fats, bacon drippings, gravies, cream sauces as well as palm and coconut oils. All these are high in saturated fats. Examine labels on cholesterol free products for hydrogenated fats. (These are oils that have been hardened into solids and in the process have become saturated.)  °DESSERTS, SNACKS Fried snack foods like potato chips; chocolate; candies in general; jams, jellies, syrups; whole- milk puddings; ice cream and milk sherbets; hydrogenated peanut butter.  °BEVERAGES Sugared fruit juices and soft drinks; cocoa made with whole milk and/or sugar. When using alcohol (1 oz liquor, 5 oz beer, or 2 ½ oz dry table wine per serving), one serving must be substituted for one bread or cereal serving (limit, two servings of alcohol per day).  ° SPECIAL NOTES  °  Remember that even non-limited foods should be used in moderation. °While on a cholesterol-lowering diet, be sure to avoid animal fats and marbled meats. °3. While on a triglyceride-lowering diet, be sure to avoid sweets and to control the amount of carbohydrates you eat (starchy foods such as flour, bread, potatoes).While on a tri-glyceride-lowering diet, be sure to avoid sweets °Buy a good low-fat cookbook, such as the one published by the American Heart Association. °Consult your physician  if you have any questions.  ° ° ° ° ° ° ° ° ° ° ° ° ° °[facility] Low Glycemic Diet Plan ° ° °Low Glycemic Foods (20-49) Moderate Glycemic Foods (50-69) High Glycemic Foods (70-100)  °    °Breakfast Creals Breakfast Cereals Breakfast Cereals  °All Bran All-Bran Fruit'n Oats  ° Bran Buds Bran Chex  ° Cheerios Corn chex  °  °Fiber One Oatmeal (not instant)  ° Just Right Mini-Wheats  ° Corn Flakes Cream of Wheat  °  °Oat Bran Special K Swiss Muesli  ° Grape Nuts Grape Nut Flakes  °  °  Grits Nutri-Grain  °  °Fruits and fruit juice: Fruits Puffed Rice Puffed Wheat  °  °(Limit to 1-2 Servings per day) Banana (under-ride) Dates  ° Rice Chex Rice Krispies  °  °Apples Apricots (fresh/dried)  ° Figs Grapes  ° Shredded Wheat Team  °  °Blackberries Blueberries  ° Kiwi Mango  ° Total   °  °Cherries Cranberries  ° Oranges Raisins  °   °Peaches Pears  °  Fruits  °Plums Prunes  ° Fruit Juices Pineapple Watermelon  °  °Grapefruit Raspberries  ° Cranberry Juice Orange Juice  ° Banana (over-ripe)   °  °Strawberries Tangerines  °    °  Apple Juice Grapefruit Juice  ° Beans and Legumes Beverages  °Tomato Juice   ° [place]-type baked beans Sodas, sweet tea, pineapple juice  ° Canned pinto, kidney, or navy beans   °Beans and Legumes (fresh-cooked) Green peas Vegetables  °Black-eyed peas Butter Beans  °  Potato, baked, boiled, fried, mashed  °Chick peas Lentils  ° Vegetables French fries  °Green beans Lima beans  ° Beets Carrots  ° Canned or frozen corn  °Kidney beans Navy beans  ° Sweet potato Yam  ° Parsnips  °Pinto beans Snow peas  ° Corn on the cob Winter squash  °    °Non-starchy vegetables Grains Breads  °Asparagus, avocado, broccoli, cabbage Cornmeal Rice, brown  ° Most breads (white and whole grain)  °cauliflower, celery, cucumber, greens Rice, white Couscous  ° Bagels Bread sticks  °  °lettuce, mushrooms, peppers, tomatoes  Bread stuffing Kaiser roll  °  °okra, onions, spinach, summer squash Pasta Dinner rolls  ° Macaroni  Pizza, cheese  °   °Grains Ravioli, meat filled Spaghetti, white  ° Grains  °Barley Bulgur  °  Rice, instant Tapioca, with milk  °  °Rye Wild rice  ° Nuts   ° Cashews Macadamia  ° Candy and most cookies  °Nuts and oils    °Almonds, peanuts, sunflower seeds Snacks Snacks  °hazelnuts, pecans, walnuts Chocolate Ice cream, lowfat  ° Donuts Corn chips  °  °Oils that are liquid at room temperature Muffin Popcorn  ° Jelly beans Pretzels  °  °  Pastries  °Dairy, fish, meat, soy, and eggs    °Milk, skim Lowfat cheese  °  Restaurant and ethnic foods  °Yogurt, lowfat, fruit sugar sweetened  Most Chinese food (sugar in stir fry  °  or wok sauce)  °Lean red meat Fish  °  Teriyaki-style meats and vegetables  °Skinless chicken and turkey, shellfish    °    °Egg whites (up to 3 daily), Soy Products    °Egg yolks (up to 7 or _____ per week)    °  °

## 2022-09-04 NOTE — Progress Notes (Signed)
Date:  09/04/2022   Name:  Marco Sims   DOB:  12/13/1933   MRN:  [id number]   Chief Complaint: Hypertension, Hyperlipidemia, and Gastroesophageal Reflux  Hypertension This is a chronic problem. The current episode started more than 1 year ago. The problem has been gradually improving since onset. The problem is controlled. Pertinent negatives include no anxiety, blurred vision, chest pain, headaches, malaise/fatigue, neck pain, orthopnea, palpitations, peripheral edema, PND, shortness of breath or sweats. There are no associated agents to hypertension. There are no known risk factors for coronary artery disease. Past treatments include calcium channel blockers. The current treatment provides moderate improvement. There are no compliance problems.  There is no history of angina, kidney disease, CAD/MI, CVA, heart failure, left ventricular hypertrophy, PVD or retinopathy. There is no history of chronic renal disease, a hypertension causing med or renovascular disease.  Hyperlipidemia This is a chronic problem. The current episode started more than 1 year ago. The problem is controlled. Recent lipid tests were reviewed and are normal. He has no history of chronic renal disease. Pertinent negatives include no chest pain or shortness of breath. Current antihyperlipidemic treatment includes statins. The current treatment provides moderate improvement of lipids. There are no compliance problems.  Risk factors for coronary artery disease include hypertension and dyslipidemia.  Gastroesophageal Reflux He reports no abdominal pain, no belching, no chest pain, no choking, no coughing, no dysphagia, no early satiety, no globus sensation, no heartburn, no hoarse voice, no nausea, no sore throat, no stridor, no tooth decay, no water brash or no wheezing. This is a chronic problem. The current episode started more than 1 year ago. The problem has been gradually improving. He has tried a PPI for the symptoms. The  treatment provided moderate relief. Past procedures do not include an abdominal ultrasound or esophageal pH monitoring.    Lab Results  Component Value Date   NA 145 (H) 03/02/2022   K 4.2 03/02/2022   CO2 23 03/02/2022   GLUCOSE 124 (H) 03/02/2022   BUN 17 03/02/2022   CREATININE 1.00 03/02/2022   CALCIUM 9.1 03/02/2022   EGFR 74 03/02/2022   GFRNONAA 71 09/15/2020   Lab Results  Component Value Date   CHOL 160 09/02/2021   HDL 44 09/02/2021   LDLCALC 98 09/02/2021   LDLDIRECT 168.9 01/10/2011   TRIG 98 09/02/2021   CHOLHDL 5.6 (H) 01/09/2018   Lab Results  Component Value Date   TSH 2.62 01/10/2011   No results found for: "HGBA1C" Lab Results  Component Value Date   WBC 5.0 09/26/2019   HGB 12.1 (L) 09/26/2019   HCT 37.7 (L) 09/26/2019   MCV 79.7 (L) 09/26/2019   PLT 228 09/26/2019   Lab Results  Component Value Date   ALT 16 03/02/2022   AST 16 03/02/2022   ALKPHOS 113 03/02/2022   BILITOT 0.6 03/02/2022   Lab Results  Component Value Date   VD25OH 38 01/24/2011     Review of Systems  Constitutional:  Negative for malaise/fatigue.  HENT:  Negative for hoarse voice and sore throat.   Eyes:  Negative for blurred vision.  Respiratory:  Negative for cough, choking, shortness of breath and wheezing.   Cardiovascular:  Negative for chest pain, palpitations, orthopnea, leg swelling and PND.  Gastrointestinal:  Negative for abdominal pain, constipation, dysphagia, heartburn and nausea.  Musculoskeletal:  Negative for neck pain.  Skin:  Positive for rash.  Neurological:  Negative for headaches.    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   Hematochezia    Benign neoplasm of ascending colon    Essential hypertension 01/09/2018   GERD 11/04/2009   ERECTILE DYSFUNCTION 07/28/2008   DERMATITIS 07/28/2008   LABYRINTHITIS 11/05/2007   HYPERLIPIDEMIA 09/24/2007   ANEMIA 09/24/2007   CONDUCTIVE HEARING LOSS UNILATERAL  09/24/2007   ARTHRITIS 09/24/2007   UNS ADVRS EFF OTH RX MEDICINAL&BIOLOGICAL SBSTNC 09/24/2007    Allergies  Allergen Reactions   Lisinopril Cough    Past Surgical History:  Procedure Laterality Date   AMPUTATION Left 09/26/2019   Procedure: LEFT LITTLE FINGER TIP, AMPUTATION;  Surgeon: Hessie Knows, MD;  Location: [facility];  Service: Orthopedics;  Laterality: Left;   Colles' fracture right 1956     COLONOSCOPY  2013   COLONOSCOPY WITH PROPOFOL N/A 07/11/2018   Procedure: COLONOSCOPY WITH PROPOFOL;  Surgeon: Lucilla Lame, MD;  Location: [place];  Service: Endoscopy;  Laterality: N/A;   FRACTURE SURGERY     fractured left arm radius and  ulna 1955     fractured right tibiofibular 2009     HAND SURGERY  06/02/11   HERNIA REPAIR Right 2010, 2015   HERNIA REPAIR Left 2015   herniorpahy, rt  05/09/2009   NERVE REPAIR Right 08/12/2015   Procedure: EXPLORATION REPAIR  NERVE, DEBRIDEMENT FRACTURE RIGHT INDEX MIDDLE FINGER;  Surgeon: Daryll Brod, MD;  Location: [place];  Service: Orthopedics;  Laterality: Right;   POLYPECTOMY  07/11/2018   Procedure: POLYPECTOMY;  Surgeon: Lucilla Lame, MD;  Location: [place];  Service: Endoscopy;;   ruptured left Achilles tendon 1980     surgery for cubital tunnel syndrome and left arm 1999     tonsillectomy 1942      Social History   Tobacco Use   Smoking status: Never   Smokeless tobacco: Never   Tobacco comments:    smoking cessation materials not required  Vaping Use   Vaping Use: Never used  Substance Use Topics   Alcohol use: Yes    Alcohol/week: 0.0 standard drinks of alcohol    Comment: rare; 1 to 2 beers a month   Drug use: No     Medication list has been reviewed and updated.  Current Meds  Medication Sig   amLODipine (NORVASC) 10 MG tablet Take 1 tablet (10 mg total) by mouth daily.   mupirocin ointment (BACTROBAN) 2 % Apply to crusts on the left lower leg QAM.   omeprazole (PRILOSEC)  20 MG capsule Take 1 capsule (20 mg total) by mouth daily.   triamcinolone ointment (KENALOG) 0.1 % Apply to left lower leg QHS as directed.       09/04/2022    8:58 AM 03/02/2022    9:12 AM 09/02/2021    8:35 AM 03/02/2021    8:00 AM  GAD 7 : Generalized Anxiety Score  Nervous, Anxious, on Edge 0 0 0 0  Control/stop worrying 0 0 0 0  Worry too much - different things 0 0 0 0  Trouble relaxing 0 0 0 0  Restless 0 0 0 0  Easily annoyed or irritable 0 0 0 0  Afraid - awful might happen 0 0 0 0  Total GAD 7 Score 0 0 0 0  Anxiety Difficulty Not difficult at all Not difficult at all Not difficult at all        09/04/2022    8:58 AM 03/02/2022    9:12 AM 09/19/2021  10:38 AM  Depression screen PHQ 2/9  Decreased Interest 0 0 0  Down, Depressed, Hopeless 0 0 0  PHQ - 2 Score 0 0 0  Altered sleeping 0 0 1  Tired, decreased energy 0 0 0  Change in appetite 0 0 0  Feeling bad or failure about yourself  0 0 0  Trouble concentrating 0 0 0  Moving slowly or fidgety/restless 0 0 0  Suicidal thoughts 0 0 0  PHQ-9 Score 0 0 1  Difficult doing work/chores Not difficult at all Not difficult at all Not difficult at all    BP Readings from Last 3 Encounters:  09/04/22 120/78  05/16/22 (!) 183/87  05/02/22 (!) 148/80    Physical Exam Vitals and nursing note reviewed.  HENT:     Head: Normocephalic.     Right Ear: There is impacted cerumen.     Left Ear: Tympanic membrane and external ear normal.     Nose: Nose normal.     Mouth/Throat:     Mouth: Mucous membranes are moist.  Eyes:     General: No scleral icterus.       Right eye: No discharge.        Left eye: No discharge.     Conjunctiva/sclera: Conjunctivae normal.     Pupils: Pupils are equal, round, and reactive to light.  Neck:     Thyroid: No thyromegaly.     Vascular: No JVD.     Trachea: No tracheal deviation.  Cardiovascular:     Rate and Rhythm: Normal rate and regular rhythm.     Heart sounds: Normal heart  sounds, S1 normal and S2 normal. No murmur heard.    No systolic murmur is present.     No diastolic murmur is present.     No friction rub. No gallop. No S3 or S4 sounds.  Pulmonary:     Effort: No respiratory distress.     Breath sounds: Normal breath sounds. No wheezing, rhonchi or rales.  Abdominal:     General: Bowel sounds are normal.     Palpations: Abdomen is soft. There is no mass.     Tenderness: There is no abdominal tenderness. There is no guarding or rebound.  Genitourinary:    Prostate: Normal. Not enlarged, not tender and no nodules present.     Rectum: Normal. Guaiac result negative. No mass.  Musculoskeletal:        General: No tenderness. Normal range of motion.     Cervical back: Normal range of motion and neck supple.  Lymphadenopathy:     Cervical: No cervical adenopathy.  Skin:    General: Skin is warm.     Findings: No rash.  Neurological:     Mental Status: He is alert and oriented to person, place, and time.     Cranial Nerves: No cranial nerve deficit.     Deep Tendon Reflexes: Reflexes are normal and symmetric.     Wt Readings from Last 3 Encounters:  09/04/22 180 lb (81.6 kg)  05/16/22 177 lb (80.3 kg)  05/02/22 175 lb 0.7 oz (79.4 kg)    BP 120/78   Pulse (!) 54   Ht '5\' 9"'$  (1.753 m)   Wt 180 lb (81.6 kg)   SpO2 97%   BMI 26.58 kg/m   Assessment and Plan:  1. Essential hypertension Chronic.  Controlled.  Stable.  Blood pressure today is 120/78.  Asymptomatic.  Tolerating medication well.  Continue amlodipine 10 mg once a  day.  Will check renal panel for electrolytes and GFR.  Will recheck patient in 6 months - amLODipine (NORVASC) 10 MG tablet; Take 1 tablet (10 mg total) by mouth daily.  Dispense: 90 tablet; Refill: 1 - Renal Function Panel  2. Gastroesophageal reflux disease, unspecified whether esophagitis present Chronic.  Controlled.  Stable.  Continue omeprazole 20 mg once a day. - omeprazole (PRILOSEC) 20 MG capsule; Take 1  capsule (20 mg total) by mouth daily.  Dispense: 90 capsule; Refill: 1  3. Familial hypercholesterolemia Chronic.  Controlled.  Stable.  Patient currently dietary control and will check lipid panel for current level of control.  Patient has been given low-cholesterol dietary guidelines. - Lipid Panel With LDL/HDL Ratio  4. Prostate cancer screening DRE was done and it was normal size shape consistency of the prostate.  We will check PSA for current level of control. - PSA    Otilio Miu, MD

## 2022-09-05 LAB — RENAL FUNCTION PANEL
Albumin: 4.7 g/dL (ref 3.7–4.7)
BUN/Creatinine Ratio: 12 (ref 10–24)
BUN: 12 mg/dL (ref 8–27)
CO2: 22 mmol/L (ref 20–29)
Calcium: 9.6 mg/dL (ref 8.6–10.2)
Chloride: 103 mmol/L (ref 96–106)
Creatinine, Ser: 0.99 mg/dL (ref 0.76–1.27)
Glucose: 92 mg/dL (ref 70–99)
Phosphorus: 2.3 mg/dL — ABNORMAL LOW (ref 2.8–4.1)
Potassium: 4.4 mmol/L (ref 3.5–5.2)
Sodium: 141 mmol/L (ref 134–144)
eGFR: 74 mL/min/{1.73_m2} (ref >59)

## 2022-09-05 LAB — LIPID PANEL WITH LDL/HDL RATIO
Cholesterol, Total: 220 mg/dL — ABNORMAL HIGH (ref 100–199)
HDL: 48 mg/dL (ref >39)
LDL Chol Calc (NIH): 150 mg/dL — ABNORMAL HIGH (ref 0–99)
LDL/HDL Ratio: 3.1 ratio (ref 0.0–3.6)
Triglycerides: 120 mg/dL (ref 0–149)
VLDL Cholesterol Cal: 22 mg/dL (ref 5–40)

## 2022-09-05 LAB — PSA: Prostate Specific Ag, Serum: 2.3 ng/mL (ref 0.0–4.0)

## 2022-09-18 DIAGNOSIS — L280 Lichen simplex chronicus: Secondary | ICD-10-CM | POA: Diagnosis not present

## 2022-09-18 DIAGNOSIS — L298 Other pruritus: Secondary | ICD-10-CM | POA: Diagnosis not present

## 2022-09-20 ENCOUNTER — Ambulatory Visit (INDEPENDENT_AMBULATORY_CARE_PROVIDER_SITE_OTHER): Payer: Medicare HMO

## 2022-09-20 VITALS — BP 138/78 | Ht 69.0 in | Wt 168.0 lb

## 2022-09-20 DIAGNOSIS — Z0000 Encounter for general adult medical examination without abnormal findings: Secondary | ICD-10-CM

## 2022-09-20 NOTE — Progress Notes (Signed)
Subjective:   Marco Sims is a 89 y.o. male who presents for Medicare Annual/Subsequent preventive examination.  Review of Systems     Cardiac Risk Factors include: advanced age (>83mn, >>21women);dyslipidemia;male gender;hypertension     Objective:    Today's Vitals   09/20/22 1031  BP: 138/78  Weight: 168 lb (76.2 kg)  Height: 5' 9"$  (1.753 m)   Body mass index is 24.81 kg/m.     09/20/2022   10:42 AM 05/02/2022    9:32 AM 05/01/2022   10:00 PM 09/19/2021   10:40 AM 09/15/2020   10:20 AM 09/26/2019   10:53 AM 09/15/2019   10:21 AM  Advanced Directives  Does Patient Have a Medical Advance Directive? No No No No No No No  Would patient like information on creating a medical advance directive? No - Patient declined No - Patient declined No - Patient declined Yes (MAU/Ambulatory/Procedural Areas - Information given) Yes (MAU/Ambulatory/Procedural Areas - Information given) No - Patient declined Yes (MAU/Ambulatory/Procedural Areas - Information given)    Current Medications (verified) Outpatient Encounter Medications as of 09/20/2022  Medication Sig   amLODipine (NORVASC) 10 MG tablet Take 1 tablet (10 mg total) by mouth daily.   mupirocin ointment (BACTROBAN) 2 % Apply to crusts on the left lower leg QAM.   omeprazole (PRILOSEC) 20 MG capsule Take 1 capsule (20 mg total) by mouth daily.   triamcinolone ointment (KENALOG) 0.1 % Apply to left lower leg QHS as directed.   rosuvastatin (CRESTOR) 10 MG tablet Take 10 mg by mouth daily. Cardiology Dr (Patient not taking: Reported on 03/02/2022)   No facility-administered encounter medications on file as of 09/20/2022.    Allergies (verified) Lisinopril   History: Past Medical History:  Diagnosis Date   Allergy    seaasonal   Anemia    Colon polyp    GERD (gastroesophageal reflux disease)    Hypertension    Lichen simplex chronicus    Wears hearing aid in both ears    Past Surgical History:  Procedure Laterality Date    AMPUTATION Left 09/26/2019   Procedure: LEFT LITTLE FINGER TIP, AMPUTATION;  Surgeon: MHessie Knows MD;  Location: [facility];  Service: Orthopedics;  Laterality: Left;   Colles' fracture right 1956     COLONOSCOPY  2013   COLONOSCOPY WITH PROPOFOL N/A 07/11/2018   Procedure: COLONOSCOPY WITH PROPOFOL;  Surgeon: WLucilla Lame MD;  [place] [facility]  Service: Endoscopy;  Laterality: N/A;   FRACTURE SURGERY     fractured left arm radius and  ulna 1955     fractured right tibiofibular 2009     HAND SURGERY  06/02/11   HERNIA REPAIR Right 2010, 2015   HERNIA REPAIR Left 2015   herniorpahy, rt  05/09/2009   NERVE REPAIR Right 08/12/2015   Procedure: EXPLORATION REPAIR  NERVE, DEBRIDEMENT FRACTURE RIGHT INDEX MIDDLE FINGER;  Surgeon: GDaryll Brod MD;  [place] [facility]  Service: Orthopedics;  Laterality: Right;   POLYPECTOMY  07/11/2018   Procedure: POLYPECTOMY;  Surgeon: WLucilla Lame MD;  [place] [facility]  Service: Endoscopy;;   ruptured left Achilles tendon 1980     surgery for cubital tunnel syndrome and left arm 1999     tonsillectomy 1942     Family History  Problem Relation Age of Onset   Parkinson's disease Mother    Arthritis Father    Cancer Brother    Social History   Socioeconomic History   Marital status: Widowed  Spouse name: Not on file   Number of children: 3   Years of education: Not on file   Highest education level: Bachelor's degree (e.g., BA, AB, BS)  Occupational History   Occupation: Retired  Tobacco Use   Smoking status: Never   Smokeless tobacco: Never   Tobacco comments:    smoking cessation materials not required  Vaping Use   Vaping Use: Never used  Substance and Sexual Activity   Alcohol use: Yes    Alcohol/week: 0.0 standard drinks of alcohol    Comment: rare; 1 to 2 beers a month   Drug use: No   Sexual activity: Not Currently  Other Topics Concern   Not on file  Social History Narrative   Pt lives  alone   Social Determinants of Health   Financial Resource Strain: Low Risk  (09/20/2022)   Overall Financial Resource Strain (CARDIA)    Difficulty of Paying Living Expenses: Not hard at all  Food Insecurity: No Food Insecurity (09/20/2022)   Hunger Vital Sign    Worried About Running Out of Food in the Last Year: Never true    Ran Out of Food in the Last Year: Never true  Transportation Needs: No Transportation Needs (09/20/2022)   PRAPARE - Hydrologist (Medical): No    Lack of Transportation (Non-Medical): No  Physical Activity: Insufficiently Active (09/20/2022)   Exercise Vital Sign    Days of Exercise per Week: 4 days    Minutes of Exercise per Session: 20 min  Stress: No Stress Concern Present (09/20/2022)   [place]    Feeling of Stress : Not at all  Social Connections: Moderately Isolated (09/20/2022)   Social Connection and Isolation Panel [NHANES]    Frequency of Communication with Friends and Family: More than three times a week    Frequency of Social Gatherings with Friends and Family: Twice a week    Attends Religious Services: More than 4 times per year    Active Member of [employer] or Organizations: No    Attends Archivist Meetings: Never    Marital Status: Widowed    Tobacco Counseling Counseling given: Not Answered Tobacco comments: smoking cessation materials not required   Clinical Intake:  Pre-visit preparation completed: Yes  Pain : No/denies pain     Nutritional Risks: None Diabetes: No  How often do you need to have someone help you when you read instructions, pamphlets, or other written materials from your doctor or pharmacy?: 1 - Never  Diabetic?no  Interpreter Needed?: No  Information entered by :: Kirke Shaggy, LPN   Activities of Daily Living    09/20/2022   10:43 AM  In your present state of health, do you have any difficulty  performing the following activities:  Hearing? 1  Vision? 0  Difficulty concentrating or making decisions? 0  Walking or climbing stairs? 1  Dressing or bathing? 0  Doing errands, shopping? 0  Preparing Food and eating ? N  Using the Toilet? N  In the past six months, have you accidently leaked urine? N  Do you have problems with loss of bowel control? N  Managing your Medications? N  Managing your Finances? N  Housekeeping or managing your Housekeeping? N    Patient Care Team: [name], MD as PCP - General (Family Medicine)  Indicate any recent Medical Services you may have received from other than [facility] providers in the past  year (date may be approximate).     Assessment:   This is a routine wellness examination for [place].  Hearing/Vision screen Hearing Screening - Comments:: Wears aids Vision Screening - Comments:: Wears glasses- [facility].  Dietary issues and exercise activities discussed: Current Exercise Habits: Home exercise routine, Type of exercise: walking, Time (Minutes): 20, Frequency (Times/Week): 4, Weekly Exercise (Minutes/Week): 80   Goals Addressed             This Visit's Progress    DIET - EAT MORE FRUITS AND VEGETABLES         Depression Screen    09/20/2022   10:41 AM 09/04/2022    8:58 AM 03/02/2022    9:12 AM 09/19/2021   10:38 AM 09/02/2021    8:34 AM 03/02/2021    8:00 AM 09/15/2020   10:18 AM  PHQ 2/9 Scores  PHQ - 2 Score 0 0 0 0 0 0 0  PHQ- 9 Score 0 0 0 1 0 0     Fall Risk    09/20/2022   10:43 AM 09/04/2022    8:57 AM 03/02/2022    9:12 AM 09/19/2021   10:40 AM 09/15/2020   10:31 AM  Fall Risk   Falls in the past year? 0 0 0 1 1  Number falls in past yr: 0 0 0 1 1  Injury with Fall? 0 0 0 0 0  Risk for fall due to : No Fall Risks No Fall Risks No Fall Risks History of fall(s);Impaired balance/gait History of fall(s)  Follow up Falls prevention discussed;Falls evaluation completed Falls evaluation completed Falls evaluation completed  Falls prevention discussed Falls prevention discussed    FALL RISK PREVENTION PERTAINING TO THE HOME:  Any stairs in or around the home? No  If so, are there any without handrails? No  Home free of loose throw rugs in walkways, pet beds, electrical cords, etc? Yes  Adequate lighting in your home to reduce risk of falls? Yes   ASSISTIVE DEVICES UTILIZED TO PREVENT FALLS:  Life alert? No  Use of a cane, walker or w/c? No  Grab bars in the bathroom? No  Shower chair or bench in shower? No  Elevated toilet seat or a handicapped toilet? No   TIMED UP AND GO:  Was the test performed? Yes .  Length of time to ambulate 10 feet: 4 sec.   Gait steady and fast without use of assistive device  Cognitive Function:        09/20/2022   10:54 AM 09/15/2020   10:37 AM 09/15/2019   10:30 AM 09/11/2018   10:31 AM 08/30/2017   10:41 AM  6CIT Screen  What Year? 0 points 0 points 0 points 0 points 4 points  What month? 0 points 0 points 0 points 0 points 0 points  What time? 0 points 0 points 0 points 0 points 0 points  Count back from 20 0 points 0 points 0 points 0 points 0 points  Months in reverse 2 points 0 points 0 points 0 points 0 points  Repeat phrase 2 points 2 points 0 points 0 points 0 points  Total Score 4 points 2 points 0 points 0 points 4 points    Immunizations Immunization History  Administered Date(s) Administered   Influenza Split 04/26/2012, 05/24/2019   Influenza Whole 06/13/2007   Influenza, High Dose Seasonal PF 05/01/2018, 05/24/2019   Influenza,inj,Quad PF,6+ Mos 04/18/2013, 06/03/2015   Influenza-Unspecified 06/17/2012, 04/21/2013, 05/07/2013, 05/07/2014, 05/26/2014, 06/08/2015, 05/07/2016, 05/07/2017, 05/30/2017  PFIZER(Purple Top)SARS-COV-2 Vaccination 11/05/2019, 11/26/2019   Pneumococcal Conjugate-13 01/27/2015   Pneumococcal Polysaccharide-23 08/30/2017, 10/31/2017, 05/24/2019   Td 08/08/2003   Tdap 01/16/2011, 03/20/2011   Zoster Recombinat (Shingrix)  05/24/2019, 10/02/2019   Zoster, Live 06/13/2013    TDAP status: Due, Education has been provided regarding the importance of this vaccine. Advised may receive this vaccine at local pharmacy or Health Dept. Aware to provide a copy of the vaccination record if obtained from local pharmacy or Health Dept. Verbalized acceptance and understanding.  Flu Vaccine status: Declined, Education has been provided regarding the importance of this vaccine but patient still declined. Advised may receive this vaccine at local pharmacy or Health Dept. Aware to provide a copy of the vaccination record if obtained from local pharmacy or Health Dept. Verbalized acceptance and understanding.  Pneumococcal vaccine status: Up to date  Covid-19 vaccine status: Completed vaccines  Qualifies for Shingles Vaccine? Yes   Zostavax completed Yes   Shingrix Completed?: Yes  Screening Tests Health Maintenance  Topic Date Due   COVID-19 Vaccine (3 - 2023-24 season) 09/20/2022 (Originally 04/07/2022)   INFLUENZA VACCINE  11/05/2022 (Originally 03/07/2022)   Medicare Annual Wellness (AWV)  09/21/2023   Pneumonia Vaccine 24+ Years old  Completed   Zoster Vaccines- Shingrix  Completed   HPV VACCINES  Aged Out   DTaP/Tdap/Td  Discontinued    Health Maintenance  There are no preventive care reminders to display for this patient.   Colorectal cancer screening: No longer required.   Lung Cancer Screening: (Low Dose CT Chest recommended if Age 70-80 years, 30 pack-year currently smoking OR have quit w/in 15years.) does not qualify.     Additional Screening:  Hepatitis C Screening: does not qualify; Completed no  Vision Screening: Recommended annual ophthalmology exams for early detection of glaucoma and other disorders of the eye. Is the patient up to date with their annual eye exam?  Yes  Who is the provider or what is the name of the office in which the patient attends annual eye exams? [facility]. If pt is not  established with a provider, would they like to be referred to a provider to establish care? No .   Dental Screening: Recommended annual dental exams for proper oral hygiene  Community Resource Referral / Chronic Care Management: CRR required this visit?  No   CCM required this visit?  No      Plan:     I have personally reviewed and noted the following in the patient's chart:   Medical and social history Use of alcohol, tobacco or illicit drugs  Current medications and supplements including opioid prescriptions. Patient is not currently taking opioid prescriptions. Functional ability and status Nutritional status Physical activity Advanced directives List of other physicians Hospitalizations, surgeries, and ER visits in previous 12 months Vitals Screenings to include cognitive, depression, and falls Referrals and appointments  In addition, I have reviewed and discussed with patient certain preventive protocols, quality metrics, and best practice recommendations. A written personalized care plan for preventive services as well as general preventive health recommendations were provided to patient.     Dionisio David, LPN   [id number]   Nurse Notes: none

## 2022-09-20 NOTE — Patient Instructions (Signed)
Marco Sims , Thank you for taking time to come for your Medicare Wellness Visit. I appreciate your ongoing commitment to your health goals. Please review the following plan we discussed and let me know if I can assist you in the future.   These are the goals we discussed:  Goals      DIET - EAT MORE FRUITS AND VEGETABLES     DIET - INCREASE WATER INTAKE     Recommend drinking 6-8 glasses of water per day      Exercise 150 min/wk Moderate Activity     Recommend to exercise for at least 150 minutes per week.        This is a list of the screening recommended for you and due dates:  Health Maintenance  Topic Date Due   COVID-19 Vaccine (3 - 2023-24 season) 09/20/2022*   Flu Shot  11/05/2022*   Medicare Annual Wellness Visit  09/21/2023   Pneumonia Vaccine  Completed   Zoster (Shingles) Vaccine  Completed   HPV Vaccine  Aged Out   DTaP/Tdap/Td vaccine  Discontinued  *Topic was postponed. The date shown is not the original due date.    Advanced directives: no  Conditions/risks identified: none  Next appointment: Follow up in one year for your annual wellness visit. 09/26/23 @ 9:45 am in person  Preventive Care 65 Years and Older, Male  Preventive care refers to lifestyle choices and visits with your health care provider that can promote health and wellness. What does preventive care include? A yearly physical exam. This is also called an annual well check. Dental exams once or twice a year. Routine eye exams. Ask your health care provider how often you should have your eyes checked. Personal lifestyle choices, including: Daily care of your teeth and gums. Regular physical activity. Eating a healthy diet. Avoiding tobacco and drug use. Limiting alcohol use. Practicing safe sex. Taking low doses of aspirin every day. Taking vitamin and mineral supplements as recommended by your health care provider. What happens during an annual well check? The services and screenings done  by your health care provider during your annual well check will depend on your age, overall health, lifestyle risk factors, and family history of disease. Counseling  Your health care provider may ask you questions about your: Alcohol use. Tobacco use. Drug use. Emotional well-being. Home and relationship well-being. Sexual activity. Eating habits. History of falls. Memory and ability to understand (cognition). Work and work Statistician. Screening  You may have the following tests or measurements: Height, weight, and BMI. Blood pressure. Lipid and cholesterol levels. These may be checked every 5 years, or more frequently if you are over 49 years old. Skin check. Lung cancer screening. You may have this screening every year starting at age 68 if you have a 30-pack-year history of smoking and currently smoke or have quit within the past 15 years. Fecal occult blood test (FOBT) of the stool. You may have this test every year starting at age 36. Flexible sigmoidoscopy or colonoscopy. You may have a sigmoidoscopy every 5 years or a colonoscopy every 10 years starting at age 82. Prostate cancer screening. Recommendations will vary depending on your family history and other risks. Hepatitis C blood test. Hepatitis B blood test. Sexually transmitted disease (STD) testing. Diabetes screening. This is done by checking your blood sugar (glucose) after you have not eaten for a while (fasting). You may have this done every 1-3 years. Abdominal aortic aneurysm (AAA) screening. You may need this  if you are a current or former smoker. Osteoporosis. You may be screened starting at age 75 if you are at high risk. Talk with your health care provider about your test results, treatment options, and if necessary, the need for more tests. Vaccines  Your health care provider may recommend certain vaccines, such as: Influenza vaccine. This is recommended every year. Tetanus, diphtheria, and acellular  pertussis (Tdap, Td) vaccine. You may need a Td booster every 10 years. Zoster vaccine. You may need this after age 22. Pneumococcal 13-valent conjugate (PCV13) vaccine. One dose is recommended after age 92. Pneumococcal polysaccharide (PPSV23) vaccine. One dose is recommended after age 31. Talk to your health care provider about which screenings and vaccines you need and how often you need them. This information is not intended to replace advice given to you by your health care provider. Make sure you discuss any questions you have with your health care provider. Document Released: 08/20/2015 Document Revised: 04/12/2016 Document Reviewed: 05/25/2015 Elsevier Interactive Patient Education  2017 [place] Prevention in the Home Falls can cause injuries. They can happen to people of all ages. There are many things you can do to make your home safe and to help prevent falls. What can I do on the outside of my home? Regularly fix the edges of walkways and driveways and fix any cracks. Remove anything that might make you trip as you walk through a door, such as a raised step or threshold. Trim any bushes or trees on the path to your home. Use bright outdoor lighting. Clear any walking paths of anything that might make someone trip, such as rocks or tools. Regularly check to see if handrails are loose or broken. Make sure that both sides of any steps have handrails. Any raised decks and porches should have guardrails on the edges. Have any leaves, snow, or ice cleared regularly. Use sand or salt on walking paths during winter. Clean up any spills in your garage right away. This includes oil or grease spills. What can I do in the bathroom? Use night lights. Install grab bars by the toilet and in the tub and shower. Do not use towel bars as grab bars. Use non-skid mats or decals in the tub or shower. If you need to sit down in the shower, use a plastic, non-slip stool. Keep the floor  dry. Clean up any water that spills on the floor as soon as it happens. Remove soap buildup in the tub or shower regularly. Attach bath mats securely with double-sided non-slip rug tape. Do not have throw rugs and other things on the floor that can make you trip. What can I do in the bedroom? Use night lights. Make sure that you have a light by your bed that is easy to reach. Do not use any sheets or blankets that are too big for your bed. They should not hang down onto the floor. Have a firm chair that has side arms. You can use this for support while you get dressed. Do not have throw rugs and other things on the floor that can make you trip. What can I do in the kitchen? Clean up any spills right away. Avoid walking on wet floors. Keep items that you use a lot in easy-to-reach places. If you need to reach something above you, use a strong step stool that has a grab bar. Keep electrical cords out of the way. Do not use floor polish or wax that makes floors slippery.  If you must use wax, use non-skid floor wax. Do not have throw rugs and other things on the floor that can make you trip. What can I do with my stairs? Do not leave any items on the stairs. Make sure that there are handrails on both sides of the stairs and use them. Fix handrails that are broken or loose. Make sure that handrails are as long as the stairways. Check any carpeting to make sure that it is firmly attached to the stairs. Fix any carpet that is loose or worn. Avoid having throw rugs at the top or bottom of the stairs. If you do have throw rugs, attach them to the floor with carpet tape. Make sure that you have a light switch at the top of the stairs and the bottom of the stairs. If you do not have them, ask someone to add them for you. What else can I do to help prevent falls? Wear shoes that: Do not have high heels. Have rubber bottoms. Are comfortable and fit you well. Are closed at the toe. Do not wear  sandals. If you use a stepladder: Make sure that it is fully opened. Do not climb a closed stepladder. Make sure that both sides of the stepladder are locked into place. Ask someone to hold it for you, if possible. Clearly mark and make sure that you can see: Any grab bars or handrails. First and last steps. Where the edge of each step is. Use tools that help you move around (mobility aids) if they are needed. These include: Canes. Walkers. Scooters. Crutches. Turn on the lights when you go into a dark area. Replace any light bulbs as soon as they burn out. Set up your furniture so you have a clear path. Avoid moving your furniture around. If any of your floors are uneven, fix them. If there are any pets around you, be aware of where they are. Review your medicines with your doctor. Some medicines can make you feel dizzy. This can increase your chance of falling. Ask your doctor what other things that you can do to help prevent falls. This information is not intended to replace advice given to you by your health care provider. Make sure you discuss any questions you have with your health care provider. Document Released: 05/20/2009 Document Revised: 12/30/2015 Document Reviewed: 08/28/2014 Elsevier Interactive Patient Education  2017 [employer].

## 2022-11-09 DIAGNOSIS — L280 Lichen simplex chronicus: Secondary | ICD-10-CM | POA: Diagnosis not present

## 2022-12-12 DIAGNOSIS — L280 Lichen simplex chronicus: Secondary | ICD-10-CM | POA: Diagnosis not present

## 2022-12-12 DIAGNOSIS — L298 Other pruritus: Secondary | ICD-10-CM | POA: Diagnosis not present

## 2022-12-12 DIAGNOSIS — R233 Spontaneous ecchymoses: Secondary | ICD-10-CM | POA: Diagnosis not present

## 2023-03-04 ENCOUNTER — Other Ambulatory Visit: Payer: Self-pay | Admitting: Family Medicine

## 2023-03-04 DIAGNOSIS — K219 Gastro-esophageal reflux disease without esophagitis: Secondary | ICD-10-CM

## 2023-03-05 ENCOUNTER — Ambulatory Visit (INDEPENDENT_AMBULATORY_CARE_PROVIDER_SITE_OTHER): Payer: Medicare HMO | Admitting: Family Medicine

## 2023-03-05 ENCOUNTER — Encounter: Payer: Self-pay | Admitting: Family Medicine

## 2023-03-05 ENCOUNTER — Other Ambulatory Visit
Admission: RE | Admit: 2023-03-05 | Discharge: 2023-03-05 | Disposition: A | Discharge status: Home / Self Care | Payer: Medicare HMO | Attending: Family Medicine | Admitting: Family Medicine

## 2023-03-05 VITALS — BP 126/74 | HR 50 | Ht 69.0 in | Wt 157.0 lb

## 2023-03-05 DIAGNOSIS — R1031 Right lower quadrant pain: Secondary | ICD-10-CM | POA: Diagnosis not present

## 2023-03-05 DIAGNOSIS — K429 Umbilical hernia without obstruction or gangrene: Secondary | ICD-10-CM

## 2023-03-05 DIAGNOSIS — N429 Disorder of prostate, unspecified: Secondary | ICD-10-CM | POA: Insufficient documentation

## 2023-03-05 DIAGNOSIS — K219 Gastro-esophageal reflux disease without esophagitis: Secondary | ICD-10-CM | POA: Diagnosis not present

## 2023-03-05 DIAGNOSIS — R634 Abnormal weight loss: Secondary | ICD-10-CM

## 2023-03-05 DIAGNOSIS — D649 Anemia, unspecified: Secondary | ICD-10-CM | POA: Insufficient documentation

## 2023-03-05 DIAGNOSIS — I10 Essential (primary) hypertension: Secondary | ICD-10-CM | POA: Diagnosis not present

## 2023-03-05 LAB — COMPREHENSIVE METABOLIC PANEL
ALT: 14 U/L (ref 0–44)
AST: 17 U/L (ref 15–41)
Albumin: 4 g/dL (ref 3.5–5.0)
Alkaline Phosphatase: 71 U/L (ref 38–126)
Anion gap: 7 (ref 5–15)
BUN: 16 mg/dL (ref 8–23)
CO2: 26 mmol/L (ref 22–32)
Calcium: 8.7 mg/dL — ABNORMAL LOW (ref 8.9–10.3)
Chloride: 106 mmol/L (ref 98–111)
Creatinine, Ser: 0.97 mg/dL (ref 0.61–1.24)
GFR, Estimated: >60 mL/min (ref >60)
Glucose, Bld: 102 mg/dL — ABNORMAL HIGH (ref 70–99)
Potassium: 3.8 mmol/L (ref 3.5–5.1)
Sodium: 139 mmol/L (ref 135–145)
Total Bilirubin: 0.8 mg/dL (ref 0.3–1.2)
Total Protein: 6.7 g/dL (ref 6.5–8.1)

## 2023-03-05 LAB — TSH: TSH: 1.79 u[IU]/mL (ref 0.350–4.500)

## 2023-03-05 LAB — CBC
HCT: 39.9 % (ref 39.0–52.0)
Hemoglobin: 13.6 g/dL (ref 13.0–17.0)
MCH: 29.9 pg (ref 26.0–34.0)
MCHC: 34.1 g/dL (ref 30.0–36.0)
MCV: 87.7 fL (ref 80.0–100.0)
Platelets: 152 10*3/uL (ref 150–400)
RBC: 4.55 MIL/uL (ref 4.22–5.81)
RDW: 12.9 % (ref 11.5–15.5)
WBC: 3.6 10*3/uL — ABNORMAL LOW (ref 4.0–10.5)
nRBC: 0 % (ref 0.0–0.2)

## 2023-03-05 LAB — PSA: Prostatic Specific Antigen: 1.88 ng/mL (ref 0.00–4.00)

## 2023-03-05 MED ORDER — AMLODIPINE BESYLATE 10 MG PO TABS: 10.0000 mg | ORAL_TABLET | Freq: Every day | ORAL | 1 refills | Status: AC

## 2023-03-05 MED ORDER — OMEPRAZOLE 20 MG PO CPDR: 20.0000 mg | DELAYED_RELEASE_CAPSULE | Freq: Every day | ORAL | 1 refills | Status: AC

## 2023-03-05 NOTE — Progress Notes (Signed)
Date:  03/05/2023   Name:  Marco Sims   DOB:  [birth year]   MRN:  [id number]   Chief Complaint: Gastroesophageal Reflux and Hypertension  Gastroesophageal Reflux He complains of abdominal pain. He reports no chest pain, no dysphagia, no heartburn, no hoarse voice, no nausea or no wheezing. The current episode started in the past 7 days. The problem has been gradually improving. The symptoms are aggravated by certain foods. Pertinent negatives include no fatigue. He has tried a PPI for the symptoms. The treatment provided moderate relief.  Hypertension This is a chronic problem. The current episode started more than 1 year ago. The problem has been gradually improving since onset. Pertinent negatives include no chest pain, palpitations or shortness of breath. Past treatments include calcium channel blockers. The current treatment provides moderate improvement. There are no compliance problems.   Abdominal Pain This is a new problem. The current episode started more than 1 month ago. The onset quality is gradual. The problem has been waxing and waning. The pain is located in the periumbilical region. The pain is mild. The quality of the pain is aching. Pertinent negatives include no constipation, diarrhea or nausea. The pain is aggravated by movement (lifting). His past medical history is significant for GERD.    Lab Results  Component Value Date   NA 141 09/04/2022   K 4.4 09/04/2022   CO2 22 09/04/2022   GLUCOSE 92 09/04/2022   BUN 12 09/04/2022   CREATININE 0.99 09/04/2022   CALCIUM 9.6 09/04/2022   EGFR 74 09/04/2022   GFRNONAA 71 09/15/2020   Lab Results  Component Value Date   CHOL 220 (H) 09/04/2022   HDL 48 09/04/2022   LDLCALC 150 (H) 09/04/2022   LDLDIRECT 168.9 01/10/2011   TRIG 120 09/04/2022   CHOLHDL 5.6 (H) 01/09/2018   Lab Results  Component Value Date   TSH 2.62 01/10/2011   No results found for: "HGBA1C" Lab Results  Component Value Date   WBC 5.0  09/26/2019   HGB 12.1 (L) 09/26/2019   HCT 37.7 (L) 09/26/2019   MCV 79.7 (L) 09/26/2019   PLT 228 09/26/2019   Lab Results  Component Value Date   ALT 16 03/02/2022   AST 16 03/02/2022   ALKPHOS 113 03/02/2022   BILITOT 0.6 03/02/2022   Lab Results  Component Value Date   VD25OH 38 01/24/2011     Review of Systems  Constitutional:  Positive for unexpected weight change. Negative for fatigue.  HENT:  Negative for congestion, hoarse voice and trouble swallowing.   Respiratory:  Negative for chest tightness, shortness of breath and wheezing.   Cardiovascular:  Negative for chest pain and palpitations.  Gastrointestinal:  Positive for abdominal pain. Negative for abdominal distention, blood in stool, constipation, diarrhea, dysphagia, heartburn and nausea.  Endocrine: Negative for polydipsia and polyuria.  Genitourinary:  Negative for decreased urine volume, difficulty urinating and scrotal swelling.    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   Hematochezia    Benign neoplasm of ascending colon    Essential hypertension 01/09/2018   GERD 11/04/2009   ERECTILE DYSFUNCTION 07/28/2008   DERMATITIS 07/28/2008   LABYRINTHITIS 11/05/2007   HYPERLIPIDEMIA 09/24/2007   ANEMIA 09/24/2007   CONDUCTIVE HEARING LOSS UNILATERAL 09/24/2007   ARTHRITIS 09/24/2007   UNS ADVRS EFF OTH RX MEDICINAL&BIOLOGICAL SBSTNC 09/24/2007    Allergies  Allergen Reactions   Lisinopril Cough    Past Surgical History:  Procedure Laterality Date   AMPUTATION Left 09/26/2019   Procedure: LEFT LITTLE FINGER TIP, AMPUTATION;  Surgeon: Kennedy Bucker, MD;  Location: [facility];  Service: Orthopedics;  Laterality: Left;   Colles' fracture right 1956     COLONOSCOPY  2013   COLONOSCOPY WITH PROPOFOL N/A 07/11/2018   Procedure: COLONOSCOPY WITH PROPOFOL;  Surgeon: Midge Minium, MD;  Location: [facility];  Service: Endoscopy;  Laterality: N/A;   FRACTURE  SURGERY     fractured left arm radius and  ulna 1955     fractured right tibiofibular 2009     HAND SURGERY  06/02/11   HERNIA REPAIR Right 2010, 2015   HERNIA REPAIR Left 2015   herniorpahy, rt  05/09/2009   NERVE REPAIR Right 08/12/2015   Procedure: EXPLORATION REPAIR  NERVE, DEBRIDEMENT FRACTURE RIGHT INDEX MIDDLE FINGER;  Surgeon: Cindee Salt, MD;  Location: [facility];  Service: Orthopedics;  Laterality: Right;   POLYPECTOMY  07/11/2018   Procedure: POLYPECTOMY;  Surgeon: Midge Minium, MD;  Location: [facility];  Service: Endoscopy;;   ruptured left Achilles tendon 1980     surgery for cubital tunnel syndrome and left arm 1999     tonsillectomy 1942      Social History   Tobacco Use   Smoking status: Never   Smokeless tobacco: Never   Tobacco comments:    smoking cessation materials not required  Vaping Use   Vaping status: Never Used  Substance Use Topics   Alcohol use: Yes    Alcohol/week: 0.0 standard drinks of alcohol    Comment: rare; 1 to 2 beers a month   Drug use: No     Medication list has been reviewed and updated.  No outpatient medications have been marked as taking for the 03/05/23 encounter (Office Visit) with Duanne Limerick, MD.       09/04/2022    8:58 AM 03/02/2022    9:12 AM 09/02/2021    8:35 AM 03/02/2021    8:00 AM  GAD 7 : Generalized Anxiety Score  Nervous, Anxious, on Edge 0 0 0 0  Control/stop worrying 0 0 0 0  Worry too much - different things 0 0 0 0  Trouble relaxing 0 0 0 0  Restless 0 0 0 0  Easily annoyed or irritable 0 0 0 0  Afraid - awful might happen 0 0 0 0  Total GAD 7 Score 0 0 0 0  Anxiety Difficulty Not difficult at all Not difficult at all Not difficult at all        03/05/2023    9:23 AM 09/20/2022   10:41 AM 09/04/2022    8:58 AM  Depression screen PHQ 2/9  Decreased Interest 0 0 0  Down, Depressed, Hopeless 0 0 0  PHQ - 2 Score 0 0 0  Altered sleeping 0 0 0  Tired, decreased energy 0 0 0   Change in appetite 0 0 0  Feeling bad or failure about yourself  0 0 0  Trouble concentrating 0 0 0  Moving slowly or fidgety/restless 0 0 0  Suicidal thoughts 0 0 0  PHQ-9 Score 0 0 0  Difficult doing work/chores Not difficult at all Not difficult at all Not difficult at all    BP Readings from Last 3 Encounters:  03/05/23 126/74  09/20/22 138/78  09/04/22 120/78    Physical Exam Vitals and nursing note reviewed.  HENT:     Head: Normocephalic.  Right Ear: Tympanic membrane, ear canal and external ear normal.     Left Ear: Tympanic membrane, ear canal and external ear normal.     Nose: Nose normal. No congestion.     Mouth/Throat:     Mouth: Mucous membranes are moist.     Pharynx: Oropharynx is clear.  Eyes:     General: No scleral icterus.       Right eye: No discharge.        Left eye: No discharge.     Conjunctiva/sclera: Conjunctivae normal.     Pupils: Pupils are equal, round, and reactive to light.  Neck:     Thyroid: No thyromegaly.     Vascular: No JVD.     Trachea: No tracheal deviation.  Cardiovascular:     Rate and Rhythm: Normal rate and regular rhythm.     Heart sounds: Normal heart sounds. No murmur heard.    No friction rub. No gallop.  Pulmonary:     Effort: No respiratory distress.     Breath sounds: Normal breath sounds. No wheezing, rhonchi or rales.  Chest:     Chest wall: No tenderness.  Abdominal:     General: Bowel sounds are normal.     Palpations: Abdomen is soft. There is no hepatomegaly, splenomegaly or mass.     Tenderness: There is abdominal tenderness in the right lower quadrant. There is guarding. There is no right CVA tenderness, left CVA tenderness or rebound.     Hernia: A hernia is present. Hernia is present in the umbilical area.  Genitourinary:    Prostate: Not enlarged, not tender and no nodules present.     Rectum: Normal. Guaiac result negative. No mass.     Comments: Prostate firm Musculoskeletal:        General: No  tenderness. Normal range of motion.     Cervical back: Normal range of motion and neck supple.  Lymphadenopathy:     Cervical: No cervical adenopathy.  Skin:    General: Skin is warm.     Capillary Refill: Capillary refill takes less than 2 seconds.     Findings: No rash.  Neurological:     Mental Status: He is alert and oriented to person, place, and time.     Cranial Nerves: No cranial nerve deficit.     Deep Tendon Reflexes: Reflexes are normal and symmetric.     Wt Readings from Last 3 Encounters:  03/05/23 157 lb (71.2 kg)  09/20/22 168 lb (76.2 kg)  09/04/22 180 lb (81.6 kg)    BP 126/74   Pulse (!) 50   Ht 5\' 9"  (1.753 m)   Wt 157 lb (71.2 kg)   SpO2 98%   BMI 23.18 kg/m   Assessment and Plan:  1. Essential hypertension Chronic.  Controlled.  Stable.  Asymptomatic.  Tolerating medication well.  Blood pressure today is 126/74.  Continue amlodipine 10 mg once a day. - amLODipine (NORVASC) 10 MG tablet; Take 1 tablet (10 mg total) by mouth daily.  Dispense: 90 tablet; Refill: 1  2. Weight loss, non-intentional New onset.  Patient has had a 23 pound weight loss over the past 6 months.  At first he did not have any complaints but with further questioning he brought up that it he has had some umbilical hernia that is reducible when he is lifting with some discomfort and some vague pain in the right lower quadrant.  We will proceed with evaluation with CBC CMP and PSA.  3. Right lower quadrant abdominal pain New onset.  Patient has noted over the past 4 to 6 weeks had discomfort in the right lower quadrant.  On palpation patient has tenderness on deep palpation with some guarding.  There is no rebound.  No palpable mass or organomegaly noted.  Rectal exam is noted to have negative guaiac.  There is no blood noted.  Prostate was noted to be firm but upper limits of normal size without tenderness without nodularity without asymmetry.  Will check PSA and hepatic panel.  And  proceed in the direction for weight loss evaluation pending lab work.  4. Umbilical hernia without obstruction and without gangrene Patient has noted umbilical hernia with lifting this is reducible without residual pain.  5. Gastroesophageal reflux disease, unspecified whether esophagitis present .  Controlled.  Stable.  Patient has not had any issues with reflux and will continue on omeprazole 20 mg daily. - omeprazole (PRILOSEC) 20 MG capsule; Take 1 capsule (20 mg total) by mouth daily.  Dispense: 90 capsule; Refill: 1    Elizabeth Sauer, MD

## 2023-03-06 ENCOUNTER — Other Ambulatory Visit: Payer: Self-pay

## 2023-03-06 DIAGNOSIS — R634 Abnormal weight loss: Secondary | ICD-10-CM

## 2023-03-06 DIAGNOSIS — R1031 Right lower quadrant pain: Secondary | ICD-10-CM

## 2023-03-06 NOTE — Progress Notes (Signed)
Ordered US.

## 2023-03-08 ENCOUNTER — Ambulatory Visit
Admission: RE | Admit: 2023-03-08 | Discharge: 2023-03-08 | Disposition: A | Discharge status: Home / Self Care | Payer: Medicare HMO | Source: Ambulatory Visit | Attending: Family Medicine | Admitting: Family Medicine

## 2023-03-08 DIAGNOSIS — R1031 Right lower quadrant pain: Secondary | ICD-10-CM | POA: Diagnosis not present

## 2023-03-08 DIAGNOSIS — R109 Unspecified abdominal pain: Secondary | ICD-10-CM | POA: Diagnosis not present

## 2023-03-08 DIAGNOSIS — K8020 Calculus of gallbladder without cholecystitis without obstruction: Secondary | ICD-10-CM | POA: Diagnosis not present

## 2023-03-08 DIAGNOSIS — R634 Abnormal weight loss: Secondary | ICD-10-CM | POA: Insufficient documentation

## 2023-03-12 ENCOUNTER — Ambulatory Visit (INDEPENDENT_AMBULATORY_CARE_PROVIDER_SITE_OTHER): Payer: Medicare HMO | Admitting: Family Medicine

## 2023-03-12 ENCOUNTER — Encounter: Payer: Self-pay | Admitting: Family Medicine

## 2023-03-12 VITALS — BP 136/78 | HR 61 | Ht 69.0 in | Wt 165.0 lb

## 2023-03-12 DIAGNOSIS — R634 Abnormal weight loss: Secondary | ICD-10-CM | POA: Diagnosis not present

## 2023-03-12 DIAGNOSIS — K8020 Calculus of gallbladder without cholecystitis without obstruction: Secondary | ICD-10-CM | POA: Diagnosis not present

## 2023-03-12 DIAGNOSIS — K429 Umbilical hernia without obstruction or gangrene: Secondary | ICD-10-CM

## 2023-03-12 DIAGNOSIS — R1011 Right upper quadrant pain: Secondary | ICD-10-CM

## 2023-03-12 NOTE — Progress Notes (Signed)
Date:  03/12/2023   Name:  Marco Sims   DOB:  04-21-1934   MRN:  [id number]   Chief Complaint: [place] report (Gained 7 pounds since visit thanks to [facility])  Patient is a 89 year old male who presents for a follow up for abdomenal pain andweight loss exam. The patient reports the following problems: none. Health maintenance has been reviewed up to date.      Lab Results  Component Value Date   NA 139 03/05/2023   K 3.8 03/05/2023   CO2 26 03/05/2023   GLUCOSE 102 (H) 03/05/2023   BUN 16 03/05/2023   CREATININE 0.97 03/05/2023   CALCIUM 8.7 (L) 03/05/2023   EGFR 74 09/04/2022   GFRNONAA >60 03/05/2023   Lab Results  Component Value Date   CHOL 220 (H) 09/04/2022   HDL 48 09/04/2022   LDLCALC 150 (H) 09/04/2022   LDLDIRECT 168.9 01/10/2011   TRIG 120 09/04/2022   CHOLHDL 5.6 (H) 01/09/2018   Lab Results  Component Value Date   TSH 1.790 03/05/2023   No results found for: "HGBA1C" Lab Results  Component Value Date   WBC 3.6 (L) 03/05/2023   HGB 13.6 03/05/2023   HCT 39.9 03/05/2023   MCV 87.7 03/05/2023   PLT 152 03/05/2023   Lab Results  Component Value Date   ALT 14 03/05/2023   AST 17 03/05/2023   ALKPHOS 71 03/05/2023   BILITOT 0.8 03/05/2023   Lab Results  Component Value Date   VD25OH 38 01/24/2011     Review of Systems  Constitutional:  Negative for unexpected weight change.  Respiratory:  Negative for shortness of breath.   Cardiovascular:  Negative for chest pain, palpitations and leg swelling.  Gastrointestinal:  Positive for abdominal pain and nausea. Negative for abdominal distention, anal bleeding, blood in stool, constipation, diarrhea, rectal pain and vomiting.    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   Hematochezia    Benign neoplasm of ascending colon    Essential hypertension 01/09/2018   GERD 11/04/2009   ERECTILE DYSFUNCTION 07/28/2008   DERMATITIS 07/28/2008   LABYRINTHITIS 11/05/2007    HYPERLIPIDEMIA 09/24/2007   ANEMIA 09/24/2007   CONDUCTIVE HEARING LOSS UNILATERAL 09/24/2007   ARTHRITIS 09/24/2007   UNS ADVRS EFF OTH RX MEDICINAL&BIOLOGICAL SBSTNC 09/24/2007    Allergies  Allergen Reactions   Lisinopril Cough    Past Surgical History:  Procedure Laterality Date   AMPUTATION Left 09/26/2019   Procedure: LEFT LITTLE FINGER TIP, AMPUTATION;  Surgeon: Kennedy Bucker, MD;  Location: [facility];  Service: Orthopedics;  Laterality: Left;   Colles' fracture right 1956     COLONOSCOPY  2013   COLONOSCOPY WITH PROPOFOL N/A 07/11/2018   Procedure: COLONOSCOPY WITH PROPOFOL;  Surgeon: Midge Minium, MD;  Location: [facility];  Service: Endoscopy;  Laterality: N/A;   FRACTURE SURGERY     fractured left arm radius and  ulna 1955     fractured right tibiofibular 2009     HAND SURGERY  06/02/11   HERNIA REPAIR Right 2010, 2015   HERNIA REPAIR Left 2015   herniorpahy, rt  05/09/2009   NERVE REPAIR Right 08/12/2015   Procedure: EXPLORATION REPAIR  NERVE, DEBRIDEMENT FRACTURE RIGHT INDEX MIDDLE FINGER;  Surgeon: Cindee Salt, MD;  Location: [facility];  Service: Orthopedics;  Laterality: Right;   POLYPECTOMY  07/11/2018   Procedure: POLYPECTOMY;  Surgeon: Midge Minium, MD;  Location: [facility];  Service: Endoscopy;;   ruptured left Achilles tendon 1980     surgery for cubital tunnel syndrome and left arm 1999     tonsillectomy 1942      Social History   Tobacco Use   Smoking status: Never   Smokeless tobacco: Never   Tobacco comments:    smoking cessation materials not required  Vaping Use   Vaping status: Never Used  Substance Use Topics   Alcohol use: Yes    Alcohol/week: 0.0 standard drinks of alcohol    Comment: rare; 1 to 2 beers a month   Drug use: No     Medication list has been reviewed and updated.  Current Meds  Medication Sig   amLODipine (NORVASC) 10 MG tablet Take 1 tablet (10 mg total) by mouth daily.   mupirocin  ointment (BACTROBAN) 2 % Apply to crusts on the left lower leg QAM.   omeprazole (PRILOSEC) 20 MG capsule Take 1 capsule (20 mg total) by mouth daily.   triamcinolone ointment (KENALOG) 0.1 % Apply to left lower leg QHS as directed.       09/04/2022    8:58 AM 03/02/2022    9:12 AM 09/02/2021    8:35 AM 03/02/2021    8:00 AM  GAD 7 : Generalized Anxiety Score  Nervous, Anxious, on Edge 0 0 0 0  Control/stop worrying 0 0 0 0  Worry too much - different things 0 0 0 0  Trouble relaxing 0 0 0 0  Restless 0 0 0 0  Easily annoyed or irritable 0 0 0 0  Afraid - awful might happen 0 0 0 0  Total GAD 7 Score 0 0 0 0  Anxiety Difficulty Not difficult at all Not difficult at all Not difficult at all        03/05/2023    9:23 AM 09/20/2022   10:41 AM 09/04/2022    8:58 AM  Depression screen PHQ 2/9  Decreased Interest 0 0 0  Down, Depressed, Hopeless 0 0 0  PHQ - 2 Score 0 0 0  Altered sleeping 0 0 0  Tired, decreased energy 0 0 0  Change in appetite 0 0 0  Feeling bad or failure about yourself  0 0 0  Trouble concentrating 0 0 0  Moving slowly or fidgety/restless 0 0 0  Suicidal thoughts 0 0 0  PHQ-9 Score 0 0 0  Difficult doing work/chores Not difficult at all Not difficult at all Not difficult at all    BP Readings from Last 3 Encounters:  03/12/23 136/78  03/05/23 126/74  09/20/22 138/78    Physical Exam HENT:     Head: Normocephalic.     Right Ear: Tympanic membrane normal.     Left Ear: Tympanic membrane normal.     Nose: Nose normal.     Mouth/Throat:     Mouth: Mucous membranes are moist.  Cardiovascular:     Heart sounds:     No gallop.  Pulmonary:     Breath sounds: No wheezing, rhonchi or rales.  Abdominal:     Palpations: There is no hepatomegaly or splenomegaly.     Tenderness: There is abdominal tenderness in the right upper quadrant. There is guarding. Positive signs include Murphy's sign.     Hernia: A hernia is present. Hernia is present in the  umbilical area.     Wt Readings from Last 3 Encounters:  03/12/23 165 lb (74.8 kg)  03/05/23 157 lb (71.2 kg)  09/20/22  168 lb (76.2 kg)    BP 136/78   Pulse 61   Ht 5\' 9"  (1.753 m)   Wt 165 lb (74.8 kg)   SpO2 97%   BMI 24.37 kg/m   Assessment and Plan: 1. Right upper quadrant pain New onset.  In review of previous ultrasound notes gallstones without thickened wall however patient has a positive Murphy sign there is tenderness with guarding on right upper quadrant palpation.  Patient is a poor historian and I am wondering if he is having symptomatic cholecystitis which is contributed to his recent weight loss.  There has been some weight gain when he has had monitored food intake but I think this is partly contributed by his dementia but he does not eat because he does not remember to eat but if she he is encouraged and made to eat weight loss is obtained.  The question is whether or not his gallstones are enough to be symptomatic and if so are then after the he would be able to tolerate surgery. - Ambulatory referral to General Surgery  2. Gallstones As noted on ultrasound and we will have this further evaluated by general surgery. - Ambulatory referral to General Surgery  3. Weight loss, non-intentional New onset.  Improved.  Stable.  Since being monitored by partner as to his intake patient has gradually gained weight over the course of the past 2 weeks.  4. Umbilical hernia without obstruction and without gangrene The patient is fixated on his umbilical hernia but this is reducible without tenderness and I have assured him that the I do not think this is the reason for his abdominal pain since most of his tenderness is in the right upper quadrant.    Elizabeth Sauer, MD

## 2023-03-19 NOTE — H&P (View-Only) (Signed)
Patient ID: Marco Sims, male   DOB: [birth year], 89 y.o.   MRN: [id number]  Chief Complaint: Umbilical hernia  History of Present Illness Marco Sims is a 89 y.o. male with a progressively symptomatic umbilical hernia.  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  He notes that a bulge in his umbilical area is becoming more uncomfortable at times.  Past Medical History Past Medical History:  Diagnosis Date   Allergy    seaasonal   Anemia    Colon polyp    GERD (gastroesophageal reflux disease)    Hypertension    Lichen simplex chronicus    Wears hearing aid in both ears       Past Surgical History:  Procedure Laterality Date   AMPUTATION Left 09/26/2019   Procedure: LEFT LITTLE FINGER TIP, AMPUTATION;  Surgeon: Marco Bucker, MD;  Location: [facility];  Service: Orthopedics;  Laterality: Left;   Colles' fracture right 1956     COLONOSCOPY  2013   COLONOSCOPY WITH PROPOFOL N/A 07/11/2018   Procedure: COLONOSCOPY WITH PROPOFOL;  Surgeon: Marco Minium, MD;  Location: [facility];  Service: Endoscopy;  Laterality: N/A;   FRACTURE SURGERY     fractured left arm radius and  ulna 1955     fractured right tibiofibular 2009     HAND SURGERY  06/02/11   HERNIA REPAIR Right 2010, 2015   HERNIA REPAIR Left 2015   herniorpahy, rt  05/09/2009   NERVE REPAIR Right 08/12/2015   Procedure: EXPLORATION REPAIR  NERVE, DEBRIDEMENT FRACTURE RIGHT INDEX MIDDLE FINGER;  Surgeon: Marco Salt, MD;  Location: [facility];  Service: Orthopedics;  Laterality: Right;   POLYPECTOMY  07/11/2018   Procedure: POLYPECTOMY;  Surgeon: Marco Minium, MD;  Location: [facility];  Service: Endoscopy;;   ruptured left Achilles tendon 1980     surgery for cubital tunnel syndrome and left arm 1999     tonsillectomy 1942      Allergies  Allergen Reactions   Lisinopril Cough    Current Outpatient Medications  Medication Sig Dispense Refill    amLODipine (NORVASC) 10 MG tablet Take 1 tablet (10 mg total) by mouth daily. 90 tablet 1   mupirocin ointment (BACTROBAN) 2 % Apply to crusts on the left lower leg QAM. 22 g 3   omeprazole (PRILOSEC) 20 MG capsule Take 1 capsule (20 mg total) by mouth daily. 90 capsule 1   triamcinolone ointment (KENALOG) 0.1 % Apply to left lower leg QHS as directed. 80 g 3   rosuvastatin (CRESTOR) 10 MG tablet Take 10 mg by mouth daily. Cardiology Dr (Patient not taking: Reported on 03/02/2022)     No current facility-administered medications for this visit.    Family History Family History  Problem Relation Age of Onset   Parkinson's disease Mother    Arthritis Father    Cancer Brother       Social History Social History   Tobacco Use   Smoking status: Never   Smokeless tobacco: Never   Tobacco comments:    smoking cessation materials not required  Vaping Use   Vaping status: Never Used  Substance Use Topics   Alcohol use: Yes    Alcohol/week: 0.0 standard drinks of alcohol    Comment: rare; 1 to 2 beers a month   Drug use: No        Review of Systems  Constitutional: Negative.   HENT:  Positive for hearing loss.  Eyes: Negative.   Respiratory: Negative.    Cardiovascular: Negative.   Gastrointestinal: Negative.   Genitourinary: Negative.   Skin: Negative.   Neurological: Negative.   Psychiatric/Behavioral: Negative.       Physical Exam Blood pressure (!) 173/72, pulse 60, temperature 97.8 F (36.6 C), temperature source Oral, height 5\' 9"  (1.753 m), weight 162 lb 6.4 oz (73.7 kg), SpO2 98%. Last Weight  Most recent update: 03/20/2023  9:33 AM    Weight  73.7 kg (162 lb 6.4 oz)             CONSTITUTIONAL: Well developed, and nourished, appropriately responsive and aware without distress.   EYES: Sclera non-icteric.   EARS, NOSE, MOUTH AND THROAT:  The oropharynx is clear. Oral mucosa is pink and moist.     Hearing is intact to voice.  NECK: Trachea is midline, and  there is no jugular venous distension.  LYMPH NODES:  Lymph nodes in the neck are not appreciated. RESPIRATORY:  Lungs are clear, and breath sounds are equal bilaterally.  Normal respiratory effort without pathologic use of accessory muscles. CARDIOVASCULAR: Heart is regular in rate and rhythm.   Well perfused.  GI: The abdomen is mild diastases, with a less than 2 cm supraumbilical reducible fascial defect, otherwise soft, nontender, and nondistended. There were no palpable masses.  I did not appreciate hepatosplenomegaly.  Or right upper quadrant tenderness. MUSCULOSKELETAL:  Symmetrical muscle tone appreciated in all four extremities.    SKIN: Skin turgor is normal. No pathologic skin lesions appreciated.  NEUROLOGIC:  Motor and sensation appear grossly normal.  Cranial nerves are grossly without defect. PSYCH:  Alert and oriented to person, place and time. Affect is appropriate for situation.  Data Reviewed I have personally reviewed what is currently available of the patient's imaging, recent labs and medical records.   Labs:     Latest Ref Rng & Units 03/05/2023   10:24 AM 09/26/2019   10:41 AM 06/14/2018    4:17 PM  CBC  WBC 4.0 - 10.5 K/uL 3.6  5.0  15.0   Hemoglobin 13.0 - 17.0 g/dL 41.3  24.4  01.0   Hematocrit 39.0 - 52.0 % 39.9  37.7  41.3   Platelets 150 - 400 K/uL 152  228  207       Latest Ref Rng & Units 03/05/2023   10:24 AM 09/04/2022   10:13 AM 03/02/2022    9:47 AM  CMP  Glucose 70 - 99 mg/dL 272  92  536   BUN 8 - 23 mg/dL 16  12  17    Creatinine 0.61 - 1.24 mg/dL 6.44  0.34  7.42   Sodium 135 - 145 mmol/L 139  141  145   Potassium 3.5 - 5.1 mmol/L 3.8  4.4  4.2   Chloride 98 - 111 mmol/L 106  103  108   CO2 22 - 32 mmol/L 26  22  23    Calcium 8.9 - 10.3 mg/dL 8.7  9.6  9.1   Total Protein 6.5 - 8.1 g/dL 6.7   6.6   Total Bilirubin 0.3 - 1.2 mg/dL 0.8   0.6   Alkaline Phos 38 - 126 U/L 71   113   AST 15 - 41 U/L 17   16   ALT 0 - 44 U/L 14   16      Imaging: Radiological images reviewed:  CLINICAL DATA:  Abdominal pain and weight loss.   EXAM: ABDOMEN ULTRASOUND COMPLETE   COMPARISON:  CT abdomen pelvis June 14, 2018   FINDINGS: Gallbladder: Multiple gallstones are noted in the gallbladder, largest measures 1.3 cm. No wall thickening visualized. No sonographic Murphy sign noted by sonographer.   Common bile duct: Diameter: 5 mm   Liver: No focal lesion identified. Within normal limits in parenchymal echogenicity. Portal vein is patent on color Doppler imaging with normal direction of blood flow towards the liver.   IVC: No abnormality visualized.   Pancreas: Pancreatic duct measures 4 mm.   Spleen: Size and appearance within normal limits.   Right Kidney: Length: 10.8 cm. Echogenicity within normal limits. No hydronephrosis visualized. 10.3 x 7.7 x 11.1 cm simple cyst is identified. No follow-up is recommended.   Left Kidney: Length: 10.9 cm. Echogenicity within normal limits. No mass or hydronephrosis visualized.   Abdominal aorta: No aneurysm visualized.   Other findings: None.   IMPRESSION: 1. Cholelithiasis without sonographic evidence of acute cholecystitis. 2. Pancreatic duct measures 4 mm.     Electronically Signed   By: Sherian Rein M.D.   On: 03/08/2023 10:10 Within last 24 hrs: No results found.  Assessment    Cholelithiasis, presumably asymptomatic. Small symptomatic umbilical hernia. 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   Hematochezia    Benign neoplasm of ascending colon    Essential hypertension 01/09/2018   GERD 11/04/2009   ERECTILE DYSFUNCTION 07/28/2008   DERMATITIS 07/28/2008   LABYRINTHITIS 11/05/2007   HYPERLIPIDEMIA 09/24/2007   ANEMIA 09/24/2007   CONDUCTIVE HEARING LOSS UNILATERAL 09/24/2007   ARTHRITIS 09/24/2007   UNS ADVRS EFF OTH RX MEDICINAL&BIOLOGICAL SBSTNC 09/24/2007    Plan    I discussed the utilization of  his umbilical defect to access his gallbladder.  As well as combining procedures.  Expectant that his risks of having symptomatic cholelithiasis will continue to increase over the years to come.  However concerns were raised regarding general anesthesia, and also hoping to avoid additional risks with that.  We discussed the possibility of completing a fascial closure of this umbilical fascial defect under local anesthesia with MAC.  And this seems to be where he would prefer to go, deferring his gallstones until he became symptomatic.  Open umbilical hernia repair, fascial defect less than 3 cm, initial, reducible.  I discussed possibility of incarceration, strangulation, enlargement in size over time, and the need for emergency surgery in the face of these.  Also reviewed the techniques of reduction should incarceration occur, and when unsuccessful to present to the ED.  Also discussed that surgery risks include recurrence which can be up to 30% in the case of complex hernias, use of prosthetic materials (mesh) and the increased risk of infection and the possible need for re-operation and removal of mesh, possibility of post-op SBO or ileus, and the risks of general anesthetic including heart attack, stroke, sudden death or some reaction to anesthetic medications. The patient, and those present, appear to understand the risks, any and all questions were answered to the patient's satisfaction.  No guarantees were ever expressed or implied.  Face-to-face time spent with the patient and accompanying care providers(if present) was 40 minutes, with more than 50% of the time spent counseling, educating, and coordinating care of the patient.    These notes generated with voice recognition software. I apologize for typographical errors.  Campbell Lerner M.D., FACS 03/20/2023, 1:15 PM

## 2023-03-19 NOTE — Progress Notes (Unsigned)
Patient ID: Marco Sims, male   DOB: February 25, 1934, 89 y.o.   MRN: [id number]  Chief Complaint: Umbilical hernia  History of Present Illness Marco Sims is a 89 y.o. male with a progressively symptomatic umbilical hernia.  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  He notes that a bulge in his umbilical area is becoming more uncomfortable at times.  Past Medical History Past Medical History:  Diagnosis Date   Allergy    seaasonal   Anemia    Colon polyp    GERD (gastroesophageal reflux disease)    Hypertension    Lichen simplex chronicus    Wears hearing aid in both ears       Past Surgical History:  Procedure Laterality Date   AMPUTATION Left 09/26/2019   Procedure: LEFT LITTLE FINGER TIP, AMPUTATION;  Surgeon: Kennedy Bucker, MD;  Location: [facility];  Service: Orthopedics;  Laterality: Left;   Colles' fracture right 1956     COLONOSCOPY  2013   COLONOSCOPY WITH PROPOFOL N/A 07/11/2018   Procedure: COLONOSCOPY WITH PROPOFOL;  Surgeon: Midge Minium, MD;  Location: [facility];  Service: Endoscopy;  Laterality: N/A;   FRACTURE SURGERY     fractured left arm radius and  ulna 1955     fractured right tibiofibular 2009     HAND SURGERY  06/02/11   HERNIA REPAIR Right 2010, 2015   HERNIA REPAIR Left 2015   herniorpahy, rt  05/09/2009   NERVE REPAIR Right 08/12/2015   Procedure: EXPLORATION REPAIR  NERVE, DEBRIDEMENT FRACTURE RIGHT INDEX MIDDLE FINGER;  Surgeon: Cindee Salt, MD;  Location: [facility];  Service: Orthopedics;  Laterality: Right;   POLYPECTOMY  07/11/2018   Procedure: POLYPECTOMY;  Surgeon: Midge Minium, MD;  Location: [facility];  Service: Endoscopy;;   ruptured left Achilles tendon 1980     surgery for cubital tunnel syndrome and left arm 1999     tonsillectomy 1942      Allergies  Allergen Reactions   Lisinopril Cough    Current Outpatient Medications  Medication Sig Dispense Refill    amLODipine (NORVASC) 10 MG tablet Take 1 tablet (10 mg total) by mouth daily. 90 tablet 1   mupirocin ointment (BACTROBAN) 2 % Apply to crusts on the left lower leg QAM. 22 g 3   omeprazole (PRILOSEC) 20 MG capsule Take 1 capsule (20 mg total) by mouth daily. 90 capsule 1   triamcinolone ointment (KENALOG) 0.1 % Apply to left lower leg QHS as directed. 80 g 3   rosuvastatin (CRESTOR) 10 MG tablet Take 10 mg by mouth daily. Cardiology Dr (Patient not taking: Reported on 03/02/2022)     No current facility-administered medications for this visit.    Family History Family History  Problem Relation Age of Onset   Parkinson's disease Mother    Arthritis Father    Cancer Brother       Social History Social History   Tobacco Use   Smoking status: Never   Smokeless tobacco: Never   Tobacco comments:    smoking cessation materials not required  Vaping Use   Vaping status: Never Used  Substance Use Topics   Alcohol use: Yes    Alcohol/week: 0.0 standard drinks of alcohol    Comment: rare; 1 to 2 beers a month   Drug use: No        Review of Systems  Constitutional: Negative.   HENT:  Positive for hearing loss.  Eyes: Negative.   Respiratory: Negative.    Cardiovascular: Negative.   Gastrointestinal: Negative.   Genitourinary: Negative.   Skin: Negative.   Neurological: Negative.   Psychiatric/Behavioral: Negative.       Physical Exam Blood pressure (!) 173/72, pulse 60, temperature 97.8 F (36.6 C), temperature source Oral, height 5\' 9"  (1.753 m), weight 162 lb 6.4 oz (73.7 kg), SpO2 98%. Last Weight  Most recent update: 03/20/2023  9:33 AM    Weight  73.7 kg (162 lb 6.4 oz)             CONSTITUTIONAL: Well developed, and nourished, appropriately responsive and aware without distress.   EYES: Sclera non-icteric.   EARS, NOSE, MOUTH AND THROAT:  The oropharynx is clear. Oral mucosa is pink and moist.     Hearing is intact to voice.  NECK: Trachea is midline, and  there is no jugular venous distension.  LYMPH NODES:  Lymph nodes in the neck are not appreciated. RESPIRATORY:  Lungs are clear, and breath sounds are equal bilaterally.  Normal respiratory effort without pathologic use of accessory muscles. CARDIOVASCULAR: Heart is regular in rate and rhythm.   Well perfused.  GI: The abdomen is mild diastases, with a less than 2 cm supraumbilical reducible fascial defect, otherwise soft, nontender, and nondistended. There were no palpable masses.  I did not appreciate hepatosplenomegaly.  Or right upper quadrant tenderness. MUSCULOSKELETAL:  Symmetrical muscle tone appreciated in all four extremities.    SKIN: Skin turgor is normal. No pathologic skin lesions appreciated.  NEUROLOGIC:  Motor and sensation appear grossly normal.  Cranial nerves are grossly without defect. PSYCH:  Alert and oriented to person, place and time. Affect is appropriate for situation.  Data Reviewed I have personally reviewed what is currently available of the patient's imaging, recent labs and medical records.   Labs:     Latest Ref Rng & Units 03/05/2023   10:24 AM 09/26/2019   10:41 AM 06/14/2018    4:17 PM  CBC  WBC 4.0 - 10.5 K/uL 3.6  5.0  15.0   Hemoglobin 13.0 - 17.0 g/dL 40.9  81.1  91.4   Hematocrit 39.0 - 52.0 % 39.9  37.7  41.3   Platelets 150 - 400 K/uL 152  228  207       Latest Ref Rng & Units 03/05/2023   10:24 AM 09/04/2022   10:13 AM 03/02/2022    9:47 AM  CMP  Glucose 70 - 99 mg/dL 782  92  956   BUN 8 - 23 mg/dL 16  12  17    Creatinine 0.61 - 1.24 mg/dL 2.13  0.86  5.78   Sodium 135 - 145 mmol/L 139  141  145   Potassium 3.5 - 5.1 mmol/L 3.8  4.4  4.2   Chloride 98 - 111 mmol/L 106  103  108   CO2 22 - 32 mmol/L 26  22  23    Calcium 8.9 - 10.3 mg/dL 8.7  9.6  9.1   Total Protein 6.5 - 8.1 g/dL 6.7   6.6   Total Bilirubin 0.3 - 1.2 mg/dL 0.8   0.6   Alkaline Phos 38 - 126 U/L 71   113   AST 15 - 41 U/L 17   16   ALT 0 - 44 U/L 14   16      Imaging: Radiological images reviewed:  CLINICAL DATA:  Abdominal pain and weight loss.   EXAM: ABDOMEN ULTRASOUND COMPLETE   COMPARISON:  CT abdomen pelvis June 14, 2018   FINDINGS: Gallbladder: Multiple gallstones are noted in the gallbladder, largest measures 1.3 cm. No wall thickening visualized. No sonographic Murphy sign noted by sonographer.   Common bile duct: Diameter: 5 mm   Liver: No focal lesion identified. Within normal limits in parenchymal echogenicity. Portal vein is patent on color Doppler imaging with normal direction of blood flow towards the liver.   IVC: No abnormality visualized.   Pancreas: Pancreatic duct measures 4 mm.   Spleen: Size and appearance within normal limits.   Right Kidney: Length: 10.8 cm. Echogenicity within normal limits. No hydronephrosis visualized. 10.3 x 7.7 x 11.1 cm simple cyst is identified. No follow-up is recommended.   Left Kidney: Length: 10.9 cm. Echogenicity within normal limits. No mass or hydronephrosis visualized.   Abdominal aorta: No aneurysm visualized.   Other findings: None.   IMPRESSION: 1. Cholelithiasis without sonographic evidence of acute cholecystitis. 2. Pancreatic duct measures 4 mm.     Electronically Signed   By: Sherian Rein M.D.   On: 03/08/2023 10:10 Within last 24 hrs: No results found.  Assessment    Cholelithiasis, presumably asymptomatic. Small symptomatic umbilical hernia. 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   Hematochezia    Benign neoplasm of ascending colon    Essential hypertension 01/09/2018   GERD 11/04/2009   ERECTILE DYSFUNCTION 07/28/2008   DERMATITIS 07/28/2008   LABYRINTHITIS 11/05/2007   HYPERLIPIDEMIA 09/24/2007   ANEMIA 09/24/2007   CONDUCTIVE HEARING LOSS UNILATERAL 09/24/2007   ARTHRITIS 09/24/2007   UNS ADVRS EFF OTH RX MEDICINAL&BIOLOGICAL SBSTNC 09/24/2007    Plan    I discussed the utilization of  his umbilical defect to access his gallbladder.  As well as combining procedures.  Expectant that his risks of having symptomatic cholelithiasis will continue to increase over the years to come.  However concerns were raised regarding general anesthesia, and also hoping to avoid additional risks with that.  We discussed the possibility of completing a fascial closure of this umbilical fascial defect under local anesthesia with MAC.  And this seems to be where he would prefer to go, deferring his gallstones until he became symptomatic.  Open umbilical hernia repair, fascial defect less than 3 cm, initial, reducible.  I discussed possibility of incarceration, strangulation, enlargement in size over time, and the need for emergency surgery in the face of these.  Also reviewed the techniques of reduction should incarceration occur, and when unsuccessful to present to the ED.  Also discussed that surgery risks include recurrence which can be up to 30% in the case of complex hernias, use of prosthetic materials (mesh) and the increased risk of infection and the possible need for re-operation and removal of mesh, possibility of post-op SBO or ileus, and the risks of general anesthetic including heart attack, stroke, sudden death or some reaction to anesthetic medications. The patient, and those present, appear to understand the risks, any and all questions were answered to the patient's satisfaction.  No guarantees were ever expressed or implied.  Face-to-face time spent with the patient and accompanying care providers(if present) was 40 minutes, with more than 50% of the time spent counseling, educating, and coordinating care of the patient.    These notes generated with voice recognition software. I apologize for typographical errors.  Campbell Lerner M.D., FACS 03/20/2023, 1:15 PM

## 2023-03-20 ENCOUNTER — Ambulatory Visit: Payer: Medicare HMO | Admitting: Surgery

## 2023-03-20 ENCOUNTER — Encounter: Payer: Self-pay | Admitting: Surgery

## 2023-03-20 ENCOUNTER — Ambulatory Visit: Payer: Self-pay | Admitting: Surgery

## 2023-03-20 VITALS — BP 173/72 | HR 60 | Temp 97.8°F | Ht 69.0 in | Wt 162.4 lb

## 2023-03-20 DIAGNOSIS — K8020 Calculus of gallbladder without cholecystitis without obstruction: Secondary | ICD-10-CM

## 2023-03-20 DIAGNOSIS — K429 Umbilical hernia without obstruction or gangrene: Secondary | ICD-10-CM

## 2023-03-20 NOTE — Patient Instructions (Signed)
 Our surgery scheduler Britta Mccreedy will call you within 24-48 hours to get you scheduled. If you have not heard from her after 48 hours, please call our office. Have the blue sheet available when she calls to write down important information.   If you have any concerns or questions, please feel free to call our office.   Umbilical Hernia, Adult  A hernia is a lump of tissue that pushes through an opening in the muscles. An umbilical hernia happens in the belly, near the belly button. The hernia may contain tissues from the small or large intestine. It may also have fatty tissue that covers the intestines. Umbilical hernias in adults may get worse over time. They need to be treated with surgery. There are several types of umbilical hernias. They include: Indirect hernia. This occurs just above or below the belly button. It's the most common type of umbilical hernia in adults. Direct hernia. This type occurs in an opening that's formed by the belly button. Reducible hernia. This hernia comes and goes. You may see it only when you strain, cough, or lift something heavy. This type of hernia can be pushed back into the belly (reduced). Incarcerated hernia. This traps the hernia in the wall of the belly. This type of hernia can't be pushed back into the belly. It can cause a strangulated hernia. Strangulated hernia. This hernia cuts off blood flow to the tissues inside the hernia. The tissues can die if this happens. This type of hernia must be treated right away. What are the causes? An umbilical hernia happens when tissue inside the belly pushes through an opening in the muscles of the belly. What increases the risk? You're more likely to get this hernia if: You strain while lifting or pushing heavy objects. You've had several pregnancies. You have a condition that puts pressure on your belly, and you've had it for a long time. These include: Obesity. A buildup of fluid inside your belly. Vomiting or  coughing all the time. Trouble pooping (constipation). You've had surgery that weakened the muscles in the belly. What are the signs or symptoms? The main symptom of this condition is a bulge at the belly button or near it. The bulge does not cause pain. Other symptoms depend on the type of hernia you have. A reducible hernia may be seen only when you strain, cough, or lift something heavy. Other symptoms may include: Dull pain. A feeling of pressure. An incarcerated hernia may cause very bad pain. Also, you may: Vomit or feel like you may vomit. Not be able to pass gas. A strangulated hernia may cause: Pain that gets worse and worse. Vomiting, or feeling like you may vomit. Pain when you press on the hernia. Change of color on the skin over the hernia. The skin may become red or purple. Trouble pooping. Blood in the poop. How is this diagnosed? This condition may be diagnosed based on: Your symptoms and medical history. A physical exam. You may be asked to cough or strain while standing. These actions will put pressure inside your belly. The pressure can force the hernia through the opening in your muscles. Your health care provider may try to push the hernia back into your belly (reduce). How is this treated? Surgery is the only treatment for an umbilical hernia. Surgery for a strangulated hernia must be done right away. If you have a small hernia that's not incarcerated, you may need to lose weight before the surgery is done. Follow these instructions  at home: Managing constipation You may need to take these actions to prevent trouble pooping. This will help to prevent straining. Drink enough fluid to keep your pee (urine) pale yellow. Take over-the-counter or prescription medicines. Eat foods that are high in fiber, such as beans, whole grains, and fresh fruits and vegetables. Limit foods that are high in fat and sugars, such as fried or sweet foods. General instructions Do  not try to push the hernia back in. Lose weight, if told by your provider. Watch your hernia for any changes in color or size. Tell your provider if any changes occur. You may need to avoid activities that put pressure on your hernia. You may have to avoid lifting. Ask your provider how much you can safely lift. Take over-the-counter and prescription medicines only as told by your provider. Contact a health care provider if: Your hernia gets larger or feels hard. Your hernia becomes painful. You get a fever or chills. Get help right away if: You get very bad pain near the area of the hernia, and the pain comes on suddenly. You have pain and you vomit or feel like you may vomit. The skin over your hernia changes color. These symptoms may be an emergency. Get help right away. Call 911. Do not wait to see if the symptoms go away. Do not drive yourself to the hospital. This information is not intended to replace advice given to you by your health care provider. Make sure you discuss any questions you have with your health care provider. Document Revised: 11/14/2022 Document Reviewed: 11/14/2022 Elsevier Patient Education  2024 [employer].

## 2023-03-21 ENCOUNTER — Telehealth: Payer: Self-pay | Admitting: Surgery

## 2023-03-21 NOTE — Telephone Encounter (Signed)
Patient has been advised of Pre-Admission date/time, and Surgery date at [facility].  Surgery Date: 03/28/23 Preadmission Testing Date: 03/23/23 (phone 1p-4p)  Patient has been made aware to call [phone], between 1-3:00pm the day before surgery, to find out what time to arrive for surgery.

## 2023-03-23 ENCOUNTER — Other Ambulatory Visit: Payer: Self-pay

## 2023-03-23 ENCOUNTER — Encounter
Admission: RE | Admit: 2023-03-23 | Discharge: 2023-03-23 | Disposition: A | Discharge status: Home / Self Care | Payer: Medicare HMO | Source: Ambulatory Visit | Attending: Surgery | Admitting: Surgery

## 2023-03-23 HISTORY — DX: Umbilical hernia without obstruction or gangrene: K42.9

## 2023-03-23 HISTORY — DX: Unspecified osteoarthritis, unspecified site: M19.90

## 2023-03-23 HISTORY — DX: Calculus of gallbladder without cholecystitis without obstruction: K80.20

## 2023-03-23 HISTORY — DX: Other intervertebral disc degeneration, lumbar region without mention of lumbar back pain or lower extremity pain: M51.369

## 2023-03-23 HISTORY — DX: Labyrinthitis, unspecified ear: H83.09

## 2023-03-23 HISTORY — DX: Conductive hearing loss, unspecified: H90.2

## 2023-03-23 HISTORY — DX: Other intervertebral disc degeneration, lumbar region: M51.36

## 2023-03-23 HISTORY — DX: Melena: K92.1

## 2023-03-23 HISTORY — DX: Nail dystrophy: L60.3

## 2023-03-23 HISTORY — DX: Benign neoplasm of ascending colon: D12.2

## 2023-03-23 HISTORY — DX: Dermatitis, unspecified: L30.9

## 2023-03-23 HISTORY — DX: Hyperlipidemia, unspecified: E78.5

## 2023-03-23 HISTORY — DX: Male erectile dysfunction, unspecified: N52.9

## 2023-03-23 HISTORY — DX: Abnormal weight loss: R63.4

## 2023-03-23 NOTE — Patient Instructions (Addendum)
Your procedure is scheduled on: Wednesday August 21 Report to the Registration Desk on the 1st floor of the [facility] To find out your arrival time, please call [phone] between 1PM - 3PM on: Tuesday August 20 If your arrival time is 6:00 am, do not arrive before that time as the [facility] entrance doors do not open until 6:00 am.  REMEMBER: Instructions that are not followed completely may result in serious medical risk, up to and including death; or upon the discretion of your surgeon and anesthesiologist your surgery may need to be rescheduled.  Do not eat food after midnight the night before surgery.  No gum chewing or hard candies.  One week prior to surgery: Stop Anti-inflammatories (NSAIDS) such as Advil, Aleve, Ibuprofen, Motrin, Naproxen, Naprosyn and Aspirin based products such as Excedrin, Goody's Powder, BC Powder. Stop ANY OVER THE COUNTER supplements until after surgery. You may however, continue to take Tylenol if needed for pain up until the day of surgery.  Continue taking all prescribed medications with the exception of the following:  TAKE ONLY THESE MEDICATIONS THE MORNING OF SURGERY WITH A SIP OF WATER:  omeprazole (PRILOSEC)  (take one the night before and one on the morning of surgery - helps to prevent nausea after surgery.) amLODipine (NORVASC)    No Alcohol for 24 hours before or after surgery.  No Smoking including e-cigarettes for 24 hours before surgery.  No chewable tobacco products for at least 6 hours before surgery.  No nicotine patches on the day of surgery.  Do not use any "recreational" drugs for at least a week (preferably 2 weeks) before your surgery.  Please be advised that the combination of cocaine and anesthesia may have negative outcomes, up to and including death. If you test positive for cocaine, your surgery will be cancelled.  On the morning of surgery brush your teeth with toothpaste and water, you may rinse your mouth  with mouthwash if you wish. Do not swallow any toothpaste or mouthwash.  Use CHG Soap as directed on instruction sheet.  Do not wear jewelry, make-up, hairpins, clips or nail polish.  Do not wear lotions, powders, or perfumes.   Do not shave body hair from the neck down 48 hours before surgery.  Contact lenses, hearing aids and dentures may not be worn into surgery.  Do not bring valuables to the hospital. [facility] is not responsible for any missing/lost belongings or valuables.   Notify your doctor if there is any change in your medical condition (cold, fever, infection).  Wear comfortable clothing (specific to your surgery type) to the hospital.  After surgery, you can help prevent lung complications by doing breathing exercises.  Take deep breaths and cough every 1-2 hours. Your doctor may order a device called an Incentive Spirometer to help you take deep breaths. When coughing or sneezing, hold a pillow firmly against your incision with both hands. This is called "splinting." Doing this helps protect your incision. It also decreases belly discomfort.  If you are being discharged the day of surgery, you will not be allowed to drive home. You will need a responsible individual to drive you home and stay with you for 24 hours after surgery.   If you are taking public transportation, you will need to have a responsible individual with you.  Please call the Pre-admissions Testing Dept. at [phone] if you have any questions about these instructions.  Surgery Visitation Policy:  Patients having surgery or a procedure  may have two visitors.  Children under the age of 76 must have an adult with them who is not the patient.      Preparing for Surgery with CHLORHEXIDINE GLUCONATE (CHG) Soap  Chlorhexidine Gluconate (CHG) Soap  o An antiseptic cleaner that kills germs and bonds with the skin to continue killing germs even after washing  o Used for showering the night  before surgery and morning of surgery  Before surgery, you can play an important role by reducing the number of germs on your skin.  CHG (Chlorhexidine gluconate) soap is an antiseptic cleanser which kills germs and bonds with the skin to continue killing germs even after washing.  Please do not use if you have an allergy to CHG or antibacterial soaps. If your skin becomes reddened/irritated stop using the CHG.  1. Shower the NIGHT BEFORE SURGERY and the MORNING OF SURGERY with CHG soap.  2. If you choose to wash your hair, wash your hair first as usual with your normal shampoo.  3. After shampooing, rinse your hair and body thoroughly to remove the shampoo.  4. Use CHG as you would any other liquid soap. You can apply CHG directly to the skin and wash gently with a scrungie or a clean washcloth.  5. Apply the CHG soap to your body only from the neck down. Do not use on open wounds or open sores. Avoid contact with your eyes, ears, mouth, and genitals (private parts). Wash face and genitals (private parts) with your normal soap.  6. Wash thoroughly, paying special attention to the area where your surgery will be performed.  7. Thoroughly rinse your body with warm water.  8. Do not shower/wash with your normal soap after using and rinsing off the CHG soap.  9. Pat yourself dry with a clean towel.  10. Wear clean pajamas to bed the night before surgery.  12. Place clean sheets on your bed the night of your first shower and do not sleep with pets.  13. Shower again with the CHG soap on the day of surgery prior to arriving at the hospital.  14. Do not apply any deodorants/lotions/powders.  15. Please wear clean clothes to the hospital.

## 2023-03-26 ENCOUNTER — Encounter
Admission: RE | Admit: 2023-03-26 | Discharge: 2023-03-26 | Disposition: A | Discharge status: Home / Self Care | Payer: Medicare HMO | Source: Ambulatory Visit | Attending: Surgery | Admitting: Surgery

## 2023-03-26 DIAGNOSIS — Z01812 Encounter for preprocedural laboratory examination: Secondary | ICD-10-CM

## 2023-03-26 DIAGNOSIS — I10 Essential (primary) hypertension: Secondary | ICD-10-CM

## 2023-03-26 DIAGNOSIS — R9431 Abnormal electrocardiogram [ECG] [EKG]: Secondary | ICD-10-CM | POA: Diagnosis not present

## 2023-03-26 DIAGNOSIS — Z01810 Encounter for preprocedural cardiovascular examination: Secondary | ICD-10-CM | POA: Insufficient documentation

## 2023-03-26 DIAGNOSIS — Z01818 Encounter for other preprocedural examination: Secondary | ICD-10-CM | POA: Diagnosis present

## 2023-03-28 ENCOUNTER — Other Ambulatory Visit: Payer: Self-pay

## 2023-03-28 ENCOUNTER — Ambulatory Visit: Payer: Self-pay | Admitting: Urgent Care

## 2023-03-28 ENCOUNTER — Ambulatory Visit: Payer: Medicare HMO | Admitting: Anesthesiology

## 2023-03-28 ENCOUNTER — Encounter: Payer: Self-pay | Admitting: Surgery

## 2023-03-28 ENCOUNTER — Encounter
Admission: RE | Disposition: A | Discharge status: Home / Self Care | Payer: Self-pay | Source: Home / Self Care | Attending: Surgery

## 2023-03-28 ENCOUNTER — Ambulatory Visit
Admission: RE | Admit: 2023-03-28 | Discharge: 2023-03-28 | Disposition: A | Discharge status: Home / Self Care | Payer: Medicare HMO | Attending: Surgery | Admitting: Surgery

## 2023-03-28 DIAGNOSIS — I10 Essential (primary) hypertension: Secondary | ICD-10-CM | POA: Insufficient documentation

## 2023-03-28 DIAGNOSIS — K429 Umbilical hernia without obstruction or gangrene: Secondary | ICD-10-CM | POA: Diagnosis not present

## 2023-03-28 DIAGNOSIS — K219 Gastro-esophageal reflux disease without esophagitis: Secondary | ICD-10-CM | POA: Diagnosis not present

## 2023-03-28 HISTORY — PX: UMBILICAL HERNIA REPAIR: SHX196

## 2023-03-28 SURGERY — REPAIR, HERNIA, UMBILICAL, ADULT
Anesthesia: General

## 2023-03-28 MED ORDER — FENTANYL CITRATE (PF) 100 MCG/2ML IJ SOLN
INTRAMUSCULAR | Status: AC
  Filled 2023-03-28: qty 2

## 2023-03-28 MED ORDER — CHLORHEXIDINE GLUCONATE CLOTH 2 % EX PADS: 6.0000 | MEDICATED_PAD | Freq: Once | CUTANEOUS | Status: DC

## 2023-03-28 MED ORDER — ACETAMINOPHEN 500 MG PO TABS
ORAL_TABLET | ORAL | Status: AC
  Filled 2023-03-28: qty 2

## 2023-03-28 MED ORDER — DROPERIDOL 2.5 MG/ML IJ SOLN: 0.6250 mg | Freq: Once | INTRAMUSCULAR | Status: DC | PRN

## 2023-03-28 MED ORDER — LIDOCAINE HCL (PF) 2 % IJ SOLN
INTRAMUSCULAR | Status: AC
  Filled 2023-03-28: qty 5

## 2023-03-28 MED ORDER — CEFAZOLIN SODIUM-DEXTROSE 2-4 GM/100ML-% IV SOLN
INTRAVENOUS | Status: AC
  Filled 2023-03-28: qty 100

## 2023-03-28 MED ORDER — BUPIVACAINE-EPINEPHRINE 0.25% -1:200000 IJ SOLN
INTRAMUSCULAR | Status: DC | PRN
  Administered 2023-03-28: 26 mL

## 2023-03-28 MED ORDER — IBUPROFEN 800 MG PO TABS: 800.0000 mg | ORAL_TABLET | Freq: Three times a day (TID) | ORAL | 0 refills | Status: DC | PRN

## 2023-03-28 MED ORDER — GABAPENTIN 300 MG PO CAPS
300.0000 mg | ORAL_CAPSULE | ORAL | Status: AC
  Administered 2023-03-28: 300 mg via ORAL

## 2023-03-28 MED ORDER — FENTANYL CITRATE (PF) 100 MCG/2ML IJ SOLN
INTRAMUSCULAR | Status: DC | PRN
  Administered 2023-03-28 (×2): 50 ug via INTRAVENOUS

## 2023-03-28 MED ORDER — FENTANYL CITRATE (PF) 100 MCG/2ML IJ SOLN: 25.0000 ug | INTRAMUSCULAR | Status: DC | PRN

## 2023-03-28 MED ORDER — SUCCINYLCHOLINE CHLORIDE 200 MG/10ML IV SOSY
PREFILLED_SYRINGE | INTRAVENOUS | Status: DC | PRN
  Administered 2023-03-28: 80 mg via INTRAVENOUS

## 2023-03-28 MED ORDER — ONDANSETRON HCL 4 MG/2ML IJ SOLN
INTRAMUSCULAR | Status: AC
  Filled 2023-03-28: qty 2

## 2023-03-28 MED ORDER — HYDROCODONE-ACETAMINOPHEN 5-325 MG PO TABS: 1.0000 | ORAL_TABLET | Freq: Four times a day (QID) | ORAL | 0 refills | Status: DC | PRN

## 2023-03-28 MED ORDER — CELECOXIB 200 MG PO CAPS
ORAL_CAPSULE | ORAL | Status: AC
  Filled 2023-03-28: qty 1

## 2023-03-28 MED ORDER — PROMETHAZINE HCL 25 MG/ML IJ SOLN: 6.2500 mg | INTRAMUSCULAR | Status: DC | PRN

## 2023-03-28 MED ORDER — PROPOFOL 10 MG/ML IV BOLUS
INTRAVENOUS | Status: AC
  Filled 2023-03-28: qty 20

## 2023-03-28 MED ORDER — CELECOXIB 200 MG PO CAPS
200.0000 mg | ORAL_CAPSULE | ORAL | Status: AC
  Administered 2023-03-28: 200 mg via ORAL

## 2023-03-28 MED ORDER — EPHEDRINE SULFATE (PRESSORS) 50 MG/ML IJ SOLN
INTRAMUSCULAR | Status: DC | PRN
  Administered 2023-03-28: 10 mg via INTRAVENOUS

## 2023-03-28 MED ORDER — PHENYLEPHRINE 80 MCG/ML (10ML) SYRINGE FOR IV PUSH (FOR BLOOD PRESSURE SUPPORT)
PREFILLED_SYRINGE | INTRAVENOUS | Status: AC
  Filled 2023-03-28: qty 10

## 2023-03-28 MED ORDER — PROPOFOL 10 MG/ML IV BOLUS
INTRAVENOUS | Status: DC | PRN
  Administered 2023-03-28: 30 mg via INTRAVENOUS
  Administered 2023-03-28: 100 mg via INTRAVENOUS

## 2023-03-28 MED ORDER — ONDANSETRON HCL 4 MG/2ML IJ SOLN
INTRAMUSCULAR | Status: DC | PRN
  Administered 2023-03-28: 4 mg via INTRAVENOUS

## 2023-03-28 MED ORDER — ORAL CARE MOUTH RINSE: 15.0000 mL | Freq: Once | OROMUCOSAL | Status: AC

## 2023-03-28 MED ORDER — CHLORHEXIDINE GLUCONATE 0.12 % MT SOLN
OROMUCOSAL | Status: AC
  Filled 2023-03-28: qty 15

## 2023-03-28 MED ORDER — CHLORHEXIDINE GLUCONATE 0.12 % MT SOLN
15.0000 mL | Freq: Once | OROMUCOSAL | Status: AC
  Administered 2023-03-28: 15 mL via OROMUCOSAL

## 2023-03-28 MED ORDER — GABAPENTIN 300 MG PO CAPS
ORAL_CAPSULE | ORAL | Status: AC
  Filled 2023-03-28: qty 1

## 2023-03-28 MED ORDER — DEXAMETHASONE SODIUM PHOSPHATE 10 MG/ML IJ SOLN
INTRAMUSCULAR | Status: DC | PRN
  Administered 2023-03-28: 10 mg via INTRAVENOUS

## 2023-03-28 MED ORDER — ACETAMINOPHEN 500 MG PO TABS
1000.0000 mg | ORAL_TABLET | ORAL | Status: AC
  Administered 2023-03-28: 1000 mg via ORAL

## 2023-03-28 MED ORDER — LACTATED RINGERS IV SOLN: INTRAVENOUS | Status: DC

## 2023-03-28 MED ORDER — LIDOCAINE HCL (CARDIAC) PF 100 MG/5ML IV SOSY
PREFILLED_SYRINGE | INTRAVENOUS | Status: DC | PRN
  Administered 2023-03-28: 100 mg via INTRAVENOUS

## 2023-03-28 MED ORDER — BUPIVACAINE LIPOSOME 1.3 % IJ SUSP: 20.0000 mL | Freq: Once | INTRAMUSCULAR | Status: DC

## 2023-03-28 MED ORDER — OXYCODONE HCL 5 MG PO TABS
ORAL_TABLET | ORAL | Status: AC
  Filled 2023-03-28: qty 1

## 2023-03-28 MED ORDER — OXYCODONE HCL 5 MG PO TABS
5.0000 mg | ORAL_TABLET | Freq: Once | ORAL | Status: AC | PRN
  Administered 2023-03-28: 5 mg via ORAL

## 2023-03-28 MED ORDER — ACETAMINOPHEN 10 MG/ML IV SOLN: 1000.0000 mg | Freq: Once | INTRAVENOUS | Status: DC | PRN

## 2023-03-28 MED ORDER — BUPIVACAINE-EPINEPHRINE (PF) 0.25% -1:200000 IJ SOLN
INTRAMUSCULAR | Status: AC
  Filled 2023-03-28: qty 30

## 2023-03-28 MED ORDER — OXYCODONE HCL 5 MG/5ML PO SOLN: 5.0000 mg | Freq: Once | ORAL | Status: AC | PRN

## 2023-03-28 MED ORDER — CEFAZOLIN SODIUM-DEXTROSE 2-4 GM/100ML-% IV SOLN
2.0000 g | INTRAVENOUS | Status: AC
  Administered 2023-03-28: 2 g via INTRAVENOUS

## 2023-03-28 SURGICAL SUPPLY — 33 items
ADH SKN CLS APL DERMABOND .7 (GAUZE/BANDAGES/DRESSINGS) ×1
APL PRP STRL LF DISP 70% ISPRP (MISCELLANEOUS) ×1
BLADE CLIPPER SURG (BLADE) IMPLANT
BLADE SURG 15 STRL LF DISP TIS (BLADE) ×1 IMPLANT
BLADE SURG 15 STRL SS (BLADE) ×1
CHLORAPREP W/TINT 26 (MISCELLANEOUS) ×1 IMPLANT
DERMABOND ADVANCED .7 DNX12 (GAUZE/BANDAGES/DRESSINGS) ×1 IMPLANT
DRAPE LAPAROTOMY 77X122 PED (DRAPES) ×1 IMPLANT
ELECT CAUTERY BLADE 6.4 (BLADE) ×1 IMPLANT
ELECT REM PT RETURN 9FT ADLT (ELECTROSURGICAL) ×1
ELECTRODE REM PT RTRN 9FT ADLT (ELECTROSURGICAL) ×1 IMPLANT
GAUZE 4X4 16PLY ~~LOC~~+RFID DBL (SPONGE) ×1 IMPLANT
GLOVE ORTHO TXT STRL SZ7.5 (GLOVE) ×1 IMPLANT
GOWN STRL REUS W/ TWL LRG LVL3 (GOWN DISPOSABLE) ×1 IMPLANT
GOWN STRL REUS W/ TWL XL LVL3 (GOWN DISPOSABLE) ×1 IMPLANT
GOWN STRL REUS W/TWL LRG LVL3 (GOWN DISPOSABLE) ×1
GOWN STRL REUS W/TWL XL LVL3 (GOWN DISPOSABLE) ×1
KIT TURNOVER KIT A (KITS) ×1 IMPLANT
MANIFOLD NEPTUNE II (INSTRUMENTS) ×1 IMPLANT
NDL HYPO 22X1.5 SAFETY MO (MISCELLANEOUS) ×1 IMPLANT
NEEDLE HYPO 22X1.5 SAFETY MO (MISCELLANEOUS) ×1 IMPLANT
NS IRRIG 500ML POUR BTL (IV SOLUTION) ×1 IMPLANT
PACK BASIN MINOR ARMC (MISCELLANEOUS) ×1 IMPLANT
SPIKE FLUID TRANSFER (MISCELLANEOUS) ×1 IMPLANT
SUT ETHIBOND 0 MO6 C/R (SUTURE) ×1 IMPLANT
SUT MNCRL 4-0 (SUTURE) ×1
SUT MNCRL 4-0 27XMFL (SUTURE) ×1
SUT VIC AB 3-0 SH 27 (SUTURE) ×1
SUT VIC AB 3-0 SH 27X BRD (SUTURE) ×1 IMPLANT
SUTURE MNCRL 4-0 27XMF (SUTURE) ×1 IMPLANT
SYR 10ML LL (SYRINGE) ×1 IMPLANT
TRAP FLUID SMOKE EVACUATOR (MISCELLANEOUS) ×1 IMPLANT
WATER STERILE IRR 500ML POUR (IV SOLUTION) ×1 IMPLANT

## 2023-03-28 NOTE — Anesthesia Preprocedure Evaluation (Signed)
Anesthesia Evaluation  Patient identified by MRN, date of birth, ID band Patient awake    Reviewed: Allergy & Precautions, H&P , NPO status , Patient's Chart, lab work & pertinent test results  Airway Mallampati: III  TM Distance: >3 FB Neck ROM: full    Dental  (+) Missing,    Pulmonary neg pulmonary ROS   Pulmonary exam normal        Cardiovascular Exercise Tolerance: Good hypertension, Pt. on medications (-) angina Normal cardiovascular exam  EKG Bifascicular block   Neuro/Psych negative neurological ROS  negative psych ROS   GI/Hepatic Neg liver ROS,GERD  Controlled and Medicated,,  Endo/Other  negative endocrine ROS    Renal/GU      Musculoskeletal  (+) Arthritis ,    Abdominal Normal abdominal exam  (+)   Peds  Hematology negative hematology ROS (+)   Anesthesia Other Findings Past Medical History: No date: Allergy     Comment:  seaasonal No date: Anemia No date: Arthritis No date: Benign neoplasm of ascending colon No date: Calculus of gallbladder without cholecystitis without  obstruction No date: Colon polyp No date: Conductive hearing loss, unilateral No date: DDD (degenerative disc disease), lumbar No date: Dermatitis No date: Erectile dysfunction No date: Gall stones No date: GERD (gastroesophageal reflux disease) No date: Hematochezia No date: Hyperlipidemia No date: Hypertension No date: Labyrinthitis No date: Lichen simplex chronicus No date: Nail dystrophy No date: Umbilical hernia without obstruction and without gangrene No date: Wears hearing aid in both ears No date: Weight loss, non-intentional  Past Surgical History: 09/26/2019: AMPUTATION; Left     Comment:  Procedure: LEFT LITTLE FINGER TIP, AMPUTATION;  Surgeon:              Kennedy Bucker, MD;  Location: [facility];  Service:               Orthopedics;  Laterality: Left; No date: Colles' fracture right 1956 2013:  COLONOSCOPY 07/11/2018: COLONOSCOPY WITH PROPOFOL; N/A     Comment:  Procedure: COLONOSCOPY WITH PROPOFOL;  Surgeon: Midge Minium, MD;  Location: [facility];  Service:               Endoscopy;  Laterality: N/A; No date: FRACTURE SURGERY No date: fractured left arm radius and  ulna 1955 No date: fractured right tibiofibular 2009 06/02/2011: HAND SURGERY 2010, 2015: HERNIA REPAIR; Right 2015: HERNIA REPAIR; Left 05/09/2009: herniorpahy, rt 08/12/2015: NERVE REPAIR; Right     Comment:  Procedure: EXPLORATION REPAIR  NERVE, DEBRIDEMENT               FRACTURE RIGHT INDEX MIDDLE FINGER;  Surgeon: Cindee Salt,              MD;  Location: [facility];  Service:               Orthopedics;  Laterality: Right; 07/11/2018: POLYPECTOMY     Comment:  Procedure: POLYPECTOMY;  Surgeon: Midge Minium, MD;                Location: [facility];  Service: Endoscopy;; No date: ruptured left Achilles tendon 1980 No date: surgery for cubital tunnel syndrome and left arm 1999 No date: TONSILLECTOMY No date: tonsillectomy 1942     Reproductive/Obstetrics negative OB ROS  Anesthesia Physical Anesthesia Plan  ASA: 3  Anesthesia Plan: General ETT   Post-op Pain Management: Ofirmev IV (intra-op)* and Toradol IV (intra-op)*   Induction: Intravenous  PONV Risk Score and Plan: 2 and Ondansetron and Dexamethasone  Airway Management Planned: Oral ETT  Additional Equipment:   Intra-op Plan:   Post-operative Plan: Extubation in OR  Informed Consent: I have reviewed the patients History and Physical, chart, labs and discussed the procedure including the risks, benefits and alternatives for the proposed anesthesia with the patient or authorized representative who has indicated his/her understanding and acceptance.     Dental Advisory Given  Plan Discussed with: CRNA and Surgeon  Anesthesia Plan  Comments:          Anesthesia Quick Evaluation

## 2023-03-28 NOTE — Op Note (Signed)
Umbilical Hernia Repair, 1.5 cm defect, initial, reducible.   Pre-operative Diagnosis: Umbilical hernia  Post-operative Diagnosis: same  Surgeon: Campbell Lerner, MD FACS  Anesthesia: General   Findings: 1.5 cm fascial defect diameter    Estimated Blood Loss: 5 mL                 Specimens: sac, reduced.          Complications: none              Procedure Details  The patient was seen again in the Holding Room. The benefits, complications, treatment options, and expected outcomes were discussed with the patient. The risks of bleeding, infection, recurrence of symptoms, failure to resolve symptoms, bowel injury, mesh placement, mesh infection, any of which could require further surgery were reviewed with the patient. The likelihood of improving the patient's symptoms with return to their baseline status is good.  The patient and/or family concurred with the proposed plan, giving informed consent.  The patient was taken to Operating Room, identified, and the procedure verified.  A Time Out was held and the above information confirmed.  Prior to the induction of general anesthesia, antibiotic prophylaxis was administered. VTE prophylaxis was in place. General endotracheal anesthesia was then administered and tolerated well. After the induction, the abdomen was prepped with Chloraprep and draped in the sterile fashion. The patient was positioned in the supine position.  Incision was created with a scalpel over the hernia defect. Electrocautery was used to dissect through subcutaneous tissue, the hernia sac was dissected to the pre-peritoneal plane and reduced. The hernia was measured.  I closed the hernia defect with interrupted 0 Ethibond sutures  in a vest-over-pants fashion.  Marcaine quarter percent with epinephrine and lidocaine 1% was used to inject the site. Incision was closed with 4-0 Monocryl. Dermabond was used to coat the skin.  Patient tolerated procedure well and there were no  immediate complications. Needle and laparotomy counts were correct   Campbell Lerner, M.D., [facility]  03/28/2023 ; 9:46 AM

## 2023-03-28 NOTE — Anesthesia Procedure Notes (Signed)
Procedure Name: Intubation Date/Time: 03/28/2023 8:46 AM  Performed by: Hezzie Bump, CRNAPre-anesthesia Checklist: Patient identified, Patient being monitored, Timeout performed, Emergency Drugs available and Suction available Patient Re-evaluated:Patient Re-evaluated prior to induction Oxygen Delivery Method: Circle system utilized Preoxygenation: Pre-oxygenation with 100% oxygen Induction Type: IV induction Ventilation: Mask ventilation without difficulty Laryngoscope Size: McGraph and 4 Grade View: Grade II Tube type: Oral Tube size: 7.0 mm Number of attempts: 1 Airway Equipment and Method: Stylet Placement Confirmation: ETT inserted through vocal cords under direct vision, positive ETCO2 and breath sounds checked- equal and bilateral Secured at: 22 cm Tube secured with: Tape Dental Injury: Teeth and Oropharynx as per pre-operative assessment

## 2023-03-28 NOTE — Discharge Instructions (Signed)

## 2023-03-28 NOTE — Anesthesia Postprocedure Evaluation (Signed)
Anesthesia Post Note  Patient: Marco Sims  Procedure(s) Performed: HERNIA REPAIR UMBILICAL ADULT, open  Patient location during evaluation: PACU Anesthesia Type: General Level of consciousness: awake and alert Pain management: pain level controlled Vital Signs Assessment: post-procedure vital signs reviewed and stable Respiratory status: spontaneous breathing, nonlabored ventilation and respiratory function stable Cardiovascular status: blood pressure returned to baseline and stable Postop Assessment: no apparent nausea or vomiting Anesthetic complications: no   No notable events documented.   Last Vitals:  Vitals:   03/28/23 1019 03/28/23 1026  BP:  (!) 158/72  Pulse: 70 65  Resp: 18 18  Temp:  (!) 36 C  SpO2: 97% 94%    Last Pain:  Vitals:   03/28/23 1026  TempSrc: Temporal  PainSc: 0-No pain                 Foye Deer

## 2023-03-28 NOTE — Interval H&P Note (Signed)
History and Physical Interval Note:  03/28/2023 8:31 AM  Marco Sims  has presented today for surgery, with the diagnosis of umbilical hernia initial reducible less 3 cm.  The various methods of treatment have been discussed with the patient and family. After consideration of risks, benefits and other options for treatment, the patient has consented to  Procedure(s): HERNIA REPAIR UMBILICAL ADULT, open (N/A) as a surgical intervention.  The patient's history has been reviewed, patient examined, no change in status, stable for surgery.  I have reviewed the patient's chart and labs.  Questions were answered to the patient's satisfaction.     Campbell Lerner

## 2023-03-28 NOTE — Transfer of Care (Signed)
Immediate Anesthesia Transfer of Care Note  Patient: Marco Sims  Procedure(s) Performed: HERNIA REPAIR UMBILICAL ADULT, open  Patient Location: PACU  Anesthesia Type:General  Level of Consciousness: drowsy  Airway & Oxygen Therapy: Patient Spontanous Breathing and Patient connected to face mask oxygen  Post-op Assessment: Report given to RN and Post -op Vital signs reviewed and stable  Post vital signs: Reviewed and stable  Last Vitals:  Vitals Value Taken Time  BP 148/66 03/28/23 0935  Temp    Pulse 82 03/28/23 0939  Resp 18 03/28/23 0939  SpO2 100 % 03/28/23 0939  Vitals shown include unfiled device data.  Last Pain: There were no vitals filed for this visit.       Complications: No notable events documented.

## 2023-04-10 ENCOUNTER — Ambulatory Visit (INDEPENDENT_AMBULATORY_CARE_PROVIDER_SITE_OTHER): Payer: Medicare HMO | Admitting: Physician Assistant

## 2023-04-10 ENCOUNTER — Encounter: Payer: Self-pay | Admitting: Physician Assistant

## 2023-04-10 VITALS — BP 139/63 | HR 55 | Temp 98.2°F | Ht 69.0 in | Wt 154.0 lb

## 2023-04-10 DIAGNOSIS — Z09 Encounter for follow-up examination after completed treatment for conditions other than malignant neoplasm: Secondary | ICD-10-CM | POA: Diagnosis not present

## 2023-04-10 DIAGNOSIS — K429 Umbilical hernia without obstruction or gangrene: Secondary | ICD-10-CM | POA: Diagnosis not present

## 2023-04-10 DIAGNOSIS — K8020 Calculus of gallbladder without cholecystitis without obstruction: Secondary | ICD-10-CM | POA: Diagnosis not present

## 2023-04-10 NOTE — Progress Notes (Signed)
[facility] POST-OP OFFICE VISIT  04/10/2023  HPI: Marco Sims is a 89 y.o. male 13 days s/p umbilical hernia (1.5 cm) repair with Dr Claudine Mouton   From a hernia perspective, he is doing well No abdominal pain at this time; did not need narcotic pain medications No fever, chills, nausea, emesis, or bowel changes Incision is well healed Ambulating well No other complaints  Of note, he did want to review [place] results and mention of cholelithiasis. He denied any post-prandial pain nor any RUQ discomfort. No episodes of nausea, emesis. Certainly without any juandice. He reports not being interested in pursuing surgery for this at this time. Please note, we did review signs and symptoms of cholecystitis, choledocholithiasis, and cholangitis.   Vital signs: BP 139/63   Pulse (!) 55   Temp 98.2 F (36.8 C)   Ht 5\' 9"  (1.753 m)   Wt 154 lb (69.9 kg)   SpO2 98%   BMI 22.74 kg/m    Physical Exam: Constitutional: Well appearing male, NAD Abdomen: Soft, non-tender, non-distended, no rebound/guarding Skin: Umbilical incision is well healed, no erythema or drainage   Assessment/Plan: This is a 89 y.o. male 13 days s/p umbilical hernia (1.5 cm) repair with Dr Claudine Mouton    - Pain control prn  - Reviewed wound care recommendation  - Reviewed lifting restrictions; 4-6 weeks total  - He can follow up on as needed basis; He understands to call with questions/concerns as well as if he believes his gallstones are causing symptoms.   -- Lynden Oxford, PA-C [facility] 04/10/2023, 3:29 PM M-F: 7am - 4pm

## 2023-04-10 NOTE — Patient Instructions (Signed)

## 2023-07-15 DIAGNOSIS — M47812 Spondylosis without myelopathy or radiculopathy, cervical region: Secondary | ICD-10-CM | POA: Diagnosis not present

## 2023-07-15 DIAGNOSIS — M542 Cervicalgia: Secondary | ICD-10-CM | POA: Diagnosis not present

## 2023-07-15 DIAGNOSIS — M1990 Unspecified osteoarthritis, unspecified site: Secondary | ICD-10-CM | POA: Diagnosis not present

## 2023-07-15 DIAGNOSIS — M436 Torticollis: Secondary | ICD-10-CM | POA: Diagnosis not present

## 2023-08-09 DIAGNOSIS — L821 Other seborrheic keratosis: Secondary | ICD-10-CM | POA: Diagnosis not present

## 2023-08-09 DIAGNOSIS — L814 Other melanin hyperpigmentation: Secondary | ICD-10-CM | POA: Diagnosis not present

## 2023-08-09 DIAGNOSIS — L400 Psoriasis vulgaris: Secondary | ICD-10-CM | POA: Diagnosis not present

## 2023-08-09 DIAGNOSIS — D225 Melanocytic nevi of trunk: Secondary | ICD-10-CM | POA: Diagnosis not present

## 2023-08-20 DIAGNOSIS — Z79899 Other long term (current) drug therapy: Secondary | ICD-10-CM | POA: Diagnosis not present

## 2023-08-20 DIAGNOSIS — L400 Psoriasis vulgaris: Secondary | ICD-10-CM | POA: Diagnosis not present

## 2023-09-19 DIAGNOSIS — R198 Other specified symptoms and signs involving the digestive system and abdomen: Secondary | ICD-10-CM | POA: Diagnosis not present

## 2023-09-19 DIAGNOSIS — L409 Psoriasis, unspecified: Secondary | ICD-10-CM | POA: Diagnosis not present

## 2023-09-19 DIAGNOSIS — I10 Essential (primary) hypertension: Secondary | ICD-10-CM | POA: Diagnosis not present

## 2023-09-19 DIAGNOSIS — Z0000 Encounter for general adult medical examination without abnormal findings: Secondary | ICD-10-CM | POA: Diagnosis not present

## 2023-09-19 DIAGNOSIS — K219 Gastro-esophageal reflux disease without esophagitis: Secondary | ICD-10-CM | POA: Diagnosis not present

## 2023-09-21 DIAGNOSIS — E559 Vitamin D deficiency, unspecified: Secondary | ICD-10-CM | POA: Diagnosis not present

## 2023-09-21 DIAGNOSIS — R0602 Shortness of breath: Secondary | ICD-10-CM | POA: Diagnosis not present

## 2023-09-21 DIAGNOSIS — K219 Gastro-esophageal reflux disease without esophagitis: Secondary | ICD-10-CM | POA: Diagnosis not present

## 2023-09-21 DIAGNOSIS — L409 Psoriasis, unspecified: Secondary | ICD-10-CM | POA: Diagnosis not present

## 2023-09-21 DIAGNOSIS — I4510 Unspecified right bundle-branch block: Secondary | ICD-10-CM | POA: Diagnosis not present

## 2023-09-21 DIAGNOSIS — E789 Disorder of lipoprotein metabolism, unspecified: Secondary | ICD-10-CM | POA: Diagnosis not present

## 2023-09-21 DIAGNOSIS — R5383 Other fatigue: Secondary | ICD-10-CM | POA: Diagnosis not present

## 2023-09-21 DIAGNOSIS — Z131 Encounter for screening for diabetes mellitus: Secondary | ICD-10-CM | POA: Diagnosis not present

## 2023-09-21 DIAGNOSIS — R198 Other specified symptoms and signs involving the digestive system and abdomen: Secondary | ICD-10-CM | POA: Diagnosis not present

## 2023-09-21 DIAGNOSIS — Z1159 Encounter for screening for other viral diseases: Secondary | ICD-10-CM | POA: Diagnosis not present

## 2023-09-21 DIAGNOSIS — Z113 Encounter for screening for infections with a predominantly sexual mode of transmission: Secondary | ICD-10-CM | POA: Diagnosis not present

## 2023-09-21 DIAGNOSIS — I10 Essential (primary) hypertension: Secondary | ICD-10-CM | POA: Diagnosis not present

## 2023-09-21 DIAGNOSIS — Z125 Encounter for screening for malignant neoplasm of prostate: Secondary | ICD-10-CM | POA: Diagnosis not present

## 2023-09-26 DIAGNOSIS — M79671 Pain in right foot: Secondary | ICD-10-CM | POA: Diagnosis not present

## 2023-09-26 DIAGNOSIS — S82201P Unspecified fracture of shaft of right tibia, subsequent encounter for closed fracture with malunion: Secondary | ICD-10-CM | POA: Diagnosis not present

## 2023-10-01 DIAGNOSIS — R262 Difficulty in walking, not elsewhere classified: Secondary | ICD-10-CM | POA: Diagnosis not present

## 2023-10-01 DIAGNOSIS — M79661 Pain in right lower leg: Secondary | ICD-10-CM | POA: Diagnosis not present

## 2023-10-01 DIAGNOSIS — R531 Weakness: Secondary | ICD-10-CM | POA: Diagnosis not present

## 2023-10-03 DIAGNOSIS — R531 Weakness: Secondary | ICD-10-CM | POA: Diagnosis not present

## 2023-10-03 DIAGNOSIS — R262 Difficulty in walking, not elsewhere classified: Secondary | ICD-10-CM | POA: Diagnosis not present

## 2023-10-03 DIAGNOSIS — M79661 Pain in right lower leg: Secondary | ICD-10-CM | POA: Diagnosis not present

## 2023-10-05 DIAGNOSIS — K8020 Calculus of gallbladder without cholecystitis without obstruction: Secondary | ICD-10-CM | POA: Diagnosis not present

## 2023-10-05 DIAGNOSIS — R197 Diarrhea, unspecified: Secondary | ICD-10-CM | POA: Diagnosis not present

## 2023-10-05 DIAGNOSIS — R195 Other fecal abnormalities: Secondary | ICD-10-CM | POA: Diagnosis not present

## 2024-01-28 ENCOUNTER — Emergency Department (HOSPITAL_COMMUNITY)

## 2024-01-28 ENCOUNTER — Emergency Department (HOSPITAL_COMMUNITY)
Admission: EM | Admit: 2024-01-28 | Discharge: 2024-01-29 | Disposition: A | Discharge status: Home / Self Care | Attending: Emergency Medicine | Admitting: Emergency Medicine

## 2024-01-28 ENCOUNTER — Other Ambulatory Visit: Payer: Self-pay

## 2024-01-28 ENCOUNTER — Encounter (HOSPITAL_COMMUNITY): Payer: Self-pay

## 2024-01-28 DIAGNOSIS — R1031 Right lower quadrant pain: Secondary | ICD-10-CM

## 2024-01-28 DIAGNOSIS — K449 Diaphragmatic hernia without obstruction or gangrene: Secondary | ICD-10-CM | POA: Insufficient documentation

## 2024-01-28 DIAGNOSIS — K5730 Diverticulosis of large intestine without perforation or abscess without bleeding: Secondary | ICD-10-CM | POA: Insufficient documentation

## 2024-01-28 DIAGNOSIS — K5790 Diverticulosis of intestine, part unspecified, without perforation or abscess without bleeding: Secondary | ICD-10-CM

## 2024-01-28 DIAGNOSIS — K760 Fatty (change of) liver, not elsewhere classified: Secondary | ICD-10-CM | POA: Insufficient documentation

## 2024-01-28 DIAGNOSIS — K8020 Calculus of gallbladder without cholecystitis without obstruction: Secondary | ICD-10-CM | POA: Insufficient documentation

## 2024-01-28 LAB — COMPREHENSIVE METABOLIC PANEL WITH GFR
ALT: 18 U/L (ref 0–44)
AST: 18 U/L (ref 15–41)
Albumin: 3.8 g/dL (ref 3.5–5.0)
Alkaline Phosphatase: 80 U/L (ref 38–126)
Anion gap: 10 (ref 5–15)
BUN: 14 mg/dL (ref 8–23)
CO2: 24 mmol/L (ref 22–32)
Calcium: 9.3 mg/dL (ref 8.9–10.3)
Chloride: 108 mmol/L (ref 98–111)
Creatinine, Ser: 1.09 mg/dL (ref 0.61–1.24)
GFR, Estimated: >60 mL/min (ref >60)
Glucose, Bld: 101 mg/dL — ABNORMAL HIGH (ref 70–99)
Potassium: 3.9 mmol/L (ref 3.5–5.1)
Sodium: 142 mmol/L (ref 135–145)
Total Bilirubin: 0.9 mg/dL (ref 0.0–1.2)
Total Protein: 7.1 g/dL (ref 6.5–8.1)

## 2024-01-28 LAB — CBC
HCT: 43 % (ref 39.0–52.0)
Hemoglobin: 14.3 g/dL (ref 13.0–17.0)
MCH: 29.1 pg (ref 26.0–34.0)
MCHC: 33.3 g/dL (ref 30.0–36.0)
MCV: 87.6 fL (ref 80.0–100.0)
Platelets: 202 10*3/uL (ref 150–400)
RBC: 4.91 MIL/uL (ref 4.22–5.81)
RDW: 12.8 % (ref 11.5–15.5)
WBC: 5.6 10*3/uL (ref 4.0–10.5)
nRBC: 0 % (ref 0.0–0.2)

## 2024-01-28 LAB — LIPASE, BLOOD: Lipase: 34 U/L (ref 11–51)

## 2024-01-28 NOTE — ED Triage Notes (Signed)
 Pt c.o RLQ pain that radiates to his right lower back since yesterday with nausea. Denies urinary s/s

## 2024-01-28 NOTE — ED Provider Triage Note (Signed)
 Emergency Medicine Provider Triage Evaluation Note  DEAUNDRE ALLSTON , a 89 y.o. male  was evaluated in triage.  Pt complains of right lower abdominal pain that has been present since yesterday, has had nausea accompanied with this.  Previous history of gallstones, has not had a cholecystectomy.  No vomiting.  He denies any dysuria or increased urinary frequency or urgency.  Review of Systems  Positive: Right lower quadrant abdominal pain, nausea Negative: Vomiting, weakness, dizziness/vertigo.  Physical Exam  BP (!) 171/65 (BP Location: Right Arm)   Pulse (!) 55   Temp (!) 97.5 F (36.4 C) (Oral)   Resp 16   SpO2 100%  Gen:   Awake, no distress   Resp:  Normal effort  MSK:   Moves extremities without difficulty  Other:  Point tenderness to the right upper quadrant, positive Murphy sign.  Medical Decision Making  Medically screening exam initiated at 8:14 PM.  Appropriate orders placed.  Norleen GORMAN Nightingale was informed that the remainder of the evaluation will be completed by another provider, this initial triage assessment does not replace that evaluation, and the importance of remaining in the ED until their evaluation is complete.  Findings concerning for potential cholelithiasis.  CT scan ordered for the abdomen to rule out intra-abdominal pathology.  Further lab workup as noted.   Myriam Dorn BROCKS, [place] 01/28/24 2016

## 2024-01-29 ENCOUNTER — Emergency Department (HOSPITAL_COMMUNITY)

## 2024-01-29 MED ORDER — IOHEXOL 350 MG/ML SOLN
75.0000 mL | Freq: Once | INTRAVENOUS | Status: AC | PRN
  Administered 2024-01-29: 75 mL via INTRAVENOUS

## 2024-01-29 MED ORDER — OXYCODONE-ACETAMINOPHEN 5-325 MG PO TABS: 1.0000 | ORAL_TABLET | Freq: Four times a day (QID) | ORAL | 0 refills | Status: AC | PRN

## 2024-01-29 NOTE — ED Provider Notes (Signed)
 [facility] Emergency Department Provider Note MRN:  [id number]  Arrival date & time: 01/29/24     Chief Complaint   Abdominal Pain   History of Present Illness   Marco Sims is a 89 y.o. year-old male presents to the ED with chief complaint of abdominal pain.  States that his symptoms started yesterday in the right lower abdomen.  He states that he was concerned about his appendix.  He states that his symptoms have improved some since.  He reports chronic low back pain.  Denies fever, chills, nausea, vomiting, diarrhea, or dysuria.  He does report known history of gallstones.  Reports history of abdominal hernia.  History provided by patient.   Review of Systems  Pertinent positive and negative review of systems noted in HPI.    Physical Exam   Vitals:   01/28/24 2333 01/29/24 0241  BP: (!) 148/73 (!) 181/60  Pulse: (!) 56 (!) 46  Resp: 20 16  Temp: 98 F (36.7 C) (!) 97.5 F (36.4 C)  SpO2: 100% 100%    CONSTITUTIONAL:  non toxic-appearing, NAD NEURO:  Alert and oriented x 3, CN 3-12 grossly intact EYES:  eyes equal and reactive ENT/NECK:  Supple, no stridor  CARDIO:  bradycardic, regular rhythm, appears well-perfused  PULM:  No respiratory distress, CTAB GI/GU:  non-distended, mild discomfort, but without focal tenderness on the right lower abdomen MSK/SPINE:  No gross deformities, no edema, moves all extremities  SKIN:  no rash, atraumatic   *Additional and/or pertinent findings included in MDM below  Diagnostic and Interventional Summary    EKG Interpretation Date/Time:    Ventricular Rate:    PR Interval:    QRS Duration:    QT Interval:    QTC Calculation:   R Axis:      Text Interpretation:         Labs Reviewed  COMPREHENSIVE METABOLIC PANEL WITH GFR - Abnormal; Notable for the following components:      Result Value   Glucose, Bld 101 (*)    All other components within normal limits  LIPASE, BLOOD  CBC   URINALYSIS, ROUTINE W REFLEX MICROSCOPIC    CT ABDOMEN PELVIS W CONTRAST  Final Result      Medications  iohexol  (OMNIPAQUE ) 350 MG/ML injection 75 mL (75 mLs Intravenous Contrast Given 01/29/24 0348)     Procedures  /  Critical Care Procedures  ED Course and Medical Decision Making  I have reviewed the triage vital signs, the nursing notes, and pertinent available records from the EMR.  Social Determinants Affecting Complexity of Care: Patient has no clinically significant social determinants affecting this chief complaint..   ED Course:    Medical Decision Making Patient here with right lower abdominal pain that started yesterday.  He was initially concerned about appendicitis.  Denies any fevers, chills, vomiting.  He states that he has had some slight nausea.  Denies any urinary symptoms.  Labs ordered in triage are all reassuring.  No elevated LFTs to suggest gallbladder disease.  Normal lipase, doubt pancreatitis.  No significant leukocytosis or profound anemia.  CT abdomen/pelvis ordered in triage is reassuring.  No clear cause for the patient's symptoms was identified tonight.  I do not feel that he requires any further emergent workup or admission to the hospital.  Feel that he can be safely discharged home with PCP follow-up.  Problems Addressed: Calculus of gallbladder without cholecystitis without obstruction: chronic illness or injury    Details: No  evidence of cholecystitis, patient has known history of gallstones Diverticulosis:    Details: No evidence of diverticulitis, discussed diverticulosis, no emergent treatment needed. Fatty liver: undiagnosed new problem with uncertain prognosis    Details: PCP follow-up Hiatal hernia: undiagnosed new problem with uncertain prognosis    Details: Advised patient of this, advised him to follow-up with PCP. Right lower quadrant abdominal pain: acute illness or injury    Details: Uncertain etiology, discussed watchful  waiting and close PCP follow-up.  Patient advised to return for new or worsening symptoms.  Amount and/or Complexity of Data Reviewed Labs: ordered.  Risk Prescription drug management.         Consultants: No consultations were needed in caring for this patient.   Treatment and Plan: I considered admission due to patient's initial presentation, but after considering the examination and diagnostic results, patient will not require admission and can be discharged with outpatient follow-up.    Final Clinical Impressions(s) / ED Diagnoses     ICD-10-CM   1. Right lower quadrant abdominal pain  R10.31     2. Calculus of gallbladder without cholecystitis without obstruction  K80.20     3. Fatty liver  K76.0     4. Hiatal hernia  K44.9     5. Diverticulosis  K57.90       ED Discharge Orders          Ordered    oxyCODONE -acetaminophen  (PERCOCET) 5-325 MG tablet  Every 6 hours PRN        01/29/24 0427              Discharge Instructions Discussed with and Provided to Patient:    Discharge Instructions      No certain cause of your symptoms was found tonight.  Please return for new or worsening symptoms.  Please follow-up with your doctor later this week.      Vicky Charleston, PA-C 01/29/24 9566    Jerral Meth, MD 01/29/24 [id number]

## 2024-01-29 NOTE — Discharge Instructions (Addendum)
 No certain cause of your symptoms was found tonight.  Please return for new or worsening symptoms.  Please follow-up with your doctor later this week.

## 2024-02-18 ENCOUNTER — Encounter: Payer: Self-pay | Admitting: Family Medicine

## 2024-02-27 ENCOUNTER — Encounter: Payer: Self-pay | Admitting: Gastroenterology

## 2024-04-22 ENCOUNTER — Ambulatory Visit: Admitting: Gastroenterology

## 2024-06-02 ENCOUNTER — Emergency Department (HOSPITAL_BASED_OUTPATIENT_CLINIC_OR_DEPARTMENT_OTHER)
Admission: EM | Admit: 2024-06-02 | Discharge: 2024-06-02 | Disposition: A | Discharge status: Home / Self Care | Attending: Emergency Medicine | Admitting: Emergency Medicine

## 2024-06-02 ENCOUNTER — Other Ambulatory Visit: Payer: Self-pay

## 2024-06-02 DIAGNOSIS — R410 Disorientation, unspecified: Secondary | ICD-10-CM | POA: Diagnosis present

## 2024-06-02 DIAGNOSIS — R531 Weakness: Secondary | ICD-10-CM | POA: Insufficient documentation

## 2024-06-02 DIAGNOSIS — Z79899 Other long term (current) drug therapy: Secondary | ICD-10-CM | POA: Diagnosis not present

## 2024-06-02 DIAGNOSIS — Z0000 Encounter for general adult medical examination without abnormal findings: Secondary | ICD-10-CM | POA: Diagnosis present

## 2024-06-02 DIAGNOSIS — L309 Dermatitis, unspecified: Secondary | ICD-10-CM | POA: Insufficient documentation

## 2024-06-02 LAB — CBC WITH DIFFERENTIAL/PLATELET
Abs Immature Granulocytes: 0.01 K/uL (ref 0.00–0.07)
Basophils Absolute: 0 K/uL (ref 0.0–0.1)
Basophils Relative: 1 %
Eosinophils Absolute: 0.3 K/uL (ref 0.0–0.5)
Eosinophils Relative: 6 %
HCT: 40.6 % (ref 39.0–52.0)
Hemoglobin: 13.9 g/dL (ref 13.0–17.0)
Immature Granulocytes: 0 %
Lymphocytes Relative: 22 %
Lymphs Abs: 1 K/uL (ref 0.7–4.0)
MCH: 29.5 pg (ref 26.0–34.0)
MCHC: 34.2 g/dL (ref 30.0–36.0)
MCV: 86.2 fL (ref 80.0–100.0)
Monocytes Absolute: 0.5 K/uL (ref 0.1–1.0)
Monocytes Relative: 11 %
Neutro Abs: 2.7 K/uL (ref 1.7–7.7)
Neutrophils Relative %: 60 %
Platelets: 207 K/uL (ref 150–400)
RBC: 4.71 MIL/uL (ref 4.22–5.81)
RDW: 12.7 % (ref 11.5–15.5)
WBC: 4.4 K/uL (ref 4.0–10.5)
nRBC: 0 % (ref 0.0–0.2)

## 2024-06-02 LAB — URINALYSIS, ROUTINE W REFLEX MICROSCOPIC
Bilirubin Urine: NEGATIVE
Glucose, UA: NEGATIVE mg/dL
Hgb urine dipstick: NEGATIVE
Ketones, ur: NEGATIVE mg/dL
Leukocytes,Ua: NEGATIVE
Nitrite: NEGATIVE
Protein, ur: NEGATIVE mg/dL
Specific Gravity, Urine: 1.017 (ref 1.005–1.030)
pH: 5 (ref 5.0–8.0)

## 2024-06-02 LAB — COMPREHENSIVE METABOLIC PANEL WITH GFR
ALT: 13 U/L (ref 0–44)
AST: 20 U/L (ref 15–41)
Albumin: 4.1 g/dL (ref 3.5–5.0)
Alkaline Phosphatase: 123 U/L (ref 38–126)
Anion gap: 11 (ref 5–15)
BUN: 26 mg/dL — ABNORMAL HIGH (ref 8–23)
CO2: 24 mmol/L (ref 22–32)
Calcium: 9.3 mg/dL (ref 8.9–10.3)
Chloride: 104 mmol/L (ref 98–111)
Creatinine, Ser: 1.11 mg/dL (ref 0.61–1.24)
GFR, Estimated: >60 mL/min (ref >60)
Glucose, Bld: 103 mg/dL — ABNORMAL HIGH (ref 70–99)
Potassium: 4.1 mmol/L (ref 3.5–5.1)
Sodium: 139 mmol/L (ref 135–145)
Total Bilirubin: 0.4 mg/dL (ref 0.0–1.2)
Total Protein: 6.9 g/dL (ref 6.5–8.1)

## 2024-06-02 LAB — MAGNESIUM: Magnesium: 2.2 mg/dL (ref 1.7–2.4)

## 2024-06-02 NOTE — ED Triage Notes (Signed)
 Reports last night around 2100 he began to have pins and needles. States hx of eczema and having same feelings in the past. Patient self reports confusion this morning when waking up. States something is wrong     When asked about confusion, he states I laid in bed longer than I normally do Denies pain. A&Ox4. NAD.

## 2024-06-02 NOTE — ED Notes (Signed)
 ED Provider at bedside.

## 2024-06-02 NOTE — ED Provider Notes (Signed)
 [facility] AT [facility] Provider Note   CSN: [id number] Arrival date & time: 06/02/24  9296     Patient presents with: Altered Mental Status   Marco Sims is a 89 y.o. male.   HPI   89 year old male presents emergency department with concern for feeling confused this morning.  He now feels back to baseline.  He admits that he has been struggling with diffuse eczema.  Got in the bathtub last night for some relief.  Had a brief sharp pain in the right thigh and left arm that went away.  Following this he had a difficult time sleeping due to discomfort secondary to the eczema.  He admits that he took some NyQuil and sleep aids, still had a restless night.  When he woke up this morning he felt like he had an episode of confusion but this has resolved.  He denies any other acute symptoms.  His appetite has been baseline.  Currently he feels well.  Prior to Admission medications   Medication Sig Start Date End Date Taking? Authorizing Provider  amLODipine  (NORVASC ) 10 MG tablet Take 1 tablet (10 mg total) by mouth daily. 03/05/23   Joshua Cathryne BROCKS, MD  mupirocin  ointment (BACTROBAN ) 2 % Apply to crusts on the left lower leg QAM. 07/07/20   Hester Alm BROCKS, MD  omeprazole  (PRILOSEC) 20 MG capsule Take 1 capsule (20 mg total) by mouth daily. 03/05/23   Joshua Cathryne BROCKS, MD  oxyCODONE -acetaminophen  (PERCOCET) 5-325 MG tablet Take 1-2 tablets by mouth every 6 (six) hours as needed. 01/29/24   Vicky Charleston, PA-C  triamcinolone  ointment (KENALOG ) 0.1 % Apply to left lower leg QHS as directed. 07/07/20   Hester Alm BROCKS, MD    Allergies: Lisinopril     Review of Systems  Constitutional:  Negative for fever.  Respiratory:  Negative for shortness of breath.   Cardiovascular:  Negative for chest pain.  Gastrointestinal:  Negative for abdominal pain, diarrhea and vomiting.  Skin:  Positive for rash.  Neurological:  Negative for headaches.   Psychiatric/Behavioral:  Positive for confusion.     Updated Vital Signs BP (!) 182/73 (BP Location: Right Arm)   Pulse (!) 59   Temp 97.9 F (36.6 C) (Oral)   Resp 18   SpO2 99%   Physical Exam Vitals and nursing note reviewed.  Constitutional:      General: He is not in acute distress.    Appearance: Normal appearance. He is not ill-appearing.  HENT:     Head: Normocephalic.     Mouth/Throat:     Mouth: Mucous membranes are moist.  Eyes:     Pupils: Pupils are equal, round, and reactive to light.  Cardiovascular:     Rate and Rhythm: Normal rate.  Pulmonary:     Effort: Pulmonary effort is normal. No respiratory distress.  Abdominal:     Palpations: Abdomen is soft.     Tenderness: There is no abdominal tenderness.  Skin:    General: Skin is warm.     Comments: Diffuse eczematous rash  Neurological:     General: No focal deficit present.     Mental Status: He is alert and oriented to person, place, and time. Mental status is at baseline.  Psychiatric:        Mood and Affect: Mood normal.     (all labs ordered are listed, but only abnormal results are displayed) Labs Reviewed  URINALYSIS, ROUTINE W REFLEX MICROSCOPIC  CBC WITH DIFFERENTIAL/PLATELET  COMPREHENSIVE METABOLIC PANEL WITH GFR  MAGNESIUM    EKG: None  Radiology: No results found.   Procedures   Medications Ordered in the ED - No data to display                                  Medical Decision Making Amount and/or Complexity of Data Reviewed Labs: ordered.   89 year old male presents emergency department with general unwell feeling, felt confused for couple minutes when waking up this morning.  Did admit to taking NyQuil/sleep aid last night because his eczema rash was keeping him from sleeping.  Vitals are normal and stable.  He is very well-appearing.  Has no acute findings on physical exam.  He is cooperative, conversational, moving in bed freely.  Blood work is normal,  urinalysis shows no infection.  Patient able to eat and drink without difficulty.  EKG shows no acute changes.  Patient feels back to baseline, he has no active complaints.  Do not feel this warrants any further emergent workup.  Will plan for outpatient follow-up.  He is otherwise happy with this discharge plan.  Patient at this time appears safe and stable for discharge and close outpatient follow up. Discharge plan and strict return to ED precautions discussed, patient verbalizes understanding and agreement.     Final diagnoses:  None    ED Discharge Orders     None          Bari Roxie HERO, DO 06/02/24 1055

## 2024-06-02 NOTE — Discharge Instructions (Signed)
 You have been seen and discharged from the emergency department.  Your blood work and urine were normal.  Stay well-hydrated.  Follow-up with your primary provider for further evaluation and further care. Take home medications as prescribed. If you have any worsening symptoms or further concerns for your health please return to an emergency department for further evaluation.
